# Patient Record
Sex: Female | Born: 1960 | Race: White | Hispanic: No | Marital: Married | Smoking: Former smoker
Health system: Southern US, Community
[De-identification: ages and names within clinical notes are randomized; demographics above are authoritative.]

## PROBLEM LIST (undated history)

## (undated) ENCOUNTER — Inpatient Hospital Stay (HOSPITAL_COMMUNITY): Payer: Commercial Managed Care - HMO

## (undated) DIAGNOSIS — F309 Manic episode, unspecified: Secondary | ICD-10-CM

## (undated) DIAGNOSIS — F319 Bipolar disorder, unspecified: Secondary | ICD-10-CM

## (undated) DIAGNOSIS — E079 Disorder of thyroid, unspecified: Secondary | ICD-10-CM

## (undated) DIAGNOSIS — F329 Major depressive disorder, single episode, unspecified: Secondary | ICD-10-CM

## (undated) DIAGNOSIS — C229 Malignant neoplasm of liver, not specified as primary or secondary: Secondary | ICD-10-CM

## (undated) DIAGNOSIS — F32A Depression, unspecified: Secondary | ICD-10-CM

## (undated) DIAGNOSIS — R51 Headache: Secondary | ICD-10-CM

## (undated) HISTORY — DX: Headache: R51

## (undated) HISTORY — DX: Depression, unspecified: F32.A

## (undated) HISTORY — DX: Manic episode, unspecified: F30.9

## (undated) HISTORY — PX: TUBAL LIGATION: SHX77

## (undated) HISTORY — DX: Major depressive disorder, single episode, unspecified: F32.9

## (undated) HISTORY — DX: Disorder of thyroid, unspecified: E07.9

---

## 2001-06-02 ENCOUNTER — Encounter: Payer: Self-pay | Admitting: Internal Medicine

## 2001-06-02 ENCOUNTER — Encounter: Admission: RE | Admit: 2001-06-02 | Discharge: 2001-06-02 | Payer: Self-pay | Admitting: Internal Medicine

## 2001-06-23 ENCOUNTER — Ambulatory Visit (HOSPITAL_COMMUNITY): Admission: RE | Admit: 2001-06-23 | Discharge: 2001-06-23 | Payer: Self-pay | Admitting: Specialist

## 2002-10-26 ENCOUNTER — Encounter: Payer: Self-pay | Admitting: Internal Medicine

## 2002-10-26 ENCOUNTER — Encounter: Admission: RE | Admit: 2002-10-26 | Discharge: 2002-10-26 | Payer: Self-pay | Admitting: Internal Medicine

## 2004-11-07 ENCOUNTER — Encounter: Admission: RE | Admit: 2004-11-07 | Discharge: 2004-11-07 | Payer: Self-pay | Admitting: Internal Medicine

## 2005-12-05 ENCOUNTER — Encounter: Admission: RE | Admit: 2005-12-05 | Discharge: 2005-12-05 | Payer: Self-pay | Admitting: Internal Medicine

## 2006-03-20 ENCOUNTER — Emergency Department (HOSPITAL_COMMUNITY): Admission: EM | Admit: 2006-03-20 | Discharge: 2006-03-20 | Payer: Self-pay | Admitting: Emergency Medicine

## 2006-03-26 ENCOUNTER — Ambulatory Visit (HOSPITAL_COMMUNITY): Payer: Self-pay | Admitting: Psychiatry

## 2006-04-03 ENCOUNTER — Ambulatory Visit (HOSPITAL_COMMUNITY): Payer: Self-pay | Admitting: Psychiatry

## 2006-04-17 ENCOUNTER — Ambulatory Visit (HOSPITAL_COMMUNITY): Payer: Self-pay | Admitting: Psychiatry

## 2006-07-30 ENCOUNTER — Ambulatory Visit: Payer: Self-pay | Admitting: Psychiatry

## 2006-07-30 ENCOUNTER — Inpatient Hospital Stay (HOSPITAL_COMMUNITY): Admission: RE | Admit: 2006-07-30 | Discharge: 2006-08-05 | Payer: Self-pay | Admitting: Psychiatry

## 2006-08-11 ENCOUNTER — Ambulatory Visit (HOSPITAL_COMMUNITY): Payer: Self-pay | Admitting: Psychiatry

## 2006-08-17 ENCOUNTER — Ambulatory Visit (HOSPITAL_COMMUNITY): Payer: Self-pay | Admitting: Psychiatry

## 2006-08-25 ENCOUNTER — Ambulatory Visit (HOSPITAL_COMMUNITY): Payer: Self-pay | Admitting: Psychiatry

## 2006-08-27 ENCOUNTER — Ambulatory Visit (HOSPITAL_COMMUNITY): Payer: Self-pay | Admitting: Psychiatry

## 2006-09-01 ENCOUNTER — Ambulatory Visit (HOSPITAL_COMMUNITY): Payer: Self-pay | Admitting: Psychiatry

## 2006-09-11 ENCOUNTER — Ambulatory Visit (HOSPITAL_COMMUNITY): Payer: Self-pay | Admitting: Psychiatry

## 2006-09-25 ENCOUNTER — Ambulatory Visit (HOSPITAL_COMMUNITY): Payer: Self-pay | Admitting: Psychiatry

## 2006-10-12 ENCOUNTER — Ambulatory Visit (HOSPITAL_COMMUNITY): Payer: Self-pay | Admitting: Psychiatry

## 2006-10-13 ENCOUNTER — Ambulatory Visit (HOSPITAL_COMMUNITY): Payer: Self-pay | Admitting: Psychiatry

## 2006-11-12 ENCOUNTER — Ambulatory Visit (HOSPITAL_COMMUNITY): Payer: Self-pay | Admitting: Psychiatry

## 2006-11-16 ENCOUNTER — Ambulatory Visit (HOSPITAL_COMMUNITY): Payer: Self-pay | Admitting: Psychiatry

## 2006-11-30 ENCOUNTER — Ambulatory Visit (HOSPITAL_COMMUNITY): Payer: Self-pay | Admitting: Psychiatry

## 2006-12-10 ENCOUNTER — Ambulatory Visit (HOSPITAL_COMMUNITY): Payer: Self-pay | Admitting: Psychiatry

## 2006-12-22 ENCOUNTER — Ambulatory Visit (HOSPITAL_COMMUNITY): Payer: Self-pay | Admitting: Psychiatry

## 2007-01-05 ENCOUNTER — Ambulatory Visit (HOSPITAL_COMMUNITY): Payer: Self-pay | Admitting: Psychiatry

## 2007-02-02 ENCOUNTER — Ambulatory Visit (HOSPITAL_COMMUNITY): Payer: Self-pay | Admitting: Psychiatry

## 2007-03-04 ENCOUNTER — Ambulatory Visit (HOSPITAL_COMMUNITY): Payer: Self-pay | Admitting: Psychiatry

## 2007-04-29 ENCOUNTER — Ambulatory Visit (HOSPITAL_COMMUNITY): Payer: Self-pay | Admitting: Psychiatry

## 2007-05-06 ENCOUNTER — Ambulatory Visit (HOSPITAL_COMMUNITY): Payer: Self-pay | Admitting: Psychiatry

## 2007-05-14 ENCOUNTER — Ambulatory Visit (HOSPITAL_COMMUNITY): Payer: Self-pay | Admitting: Psychiatry

## 2007-05-21 ENCOUNTER — Ambulatory Visit (HOSPITAL_COMMUNITY): Payer: Self-pay | Admitting: Psychiatry

## 2007-05-31 ENCOUNTER — Ambulatory Visit (HOSPITAL_COMMUNITY): Payer: Self-pay | Admitting: Psychiatry

## 2007-06-04 ENCOUNTER — Ambulatory Visit (HOSPITAL_COMMUNITY): Payer: Self-pay | Admitting: Psychiatry

## 2007-06-10 ENCOUNTER — Ambulatory Visit (HOSPITAL_COMMUNITY): Payer: Self-pay | Admitting: Psychiatry

## 2007-06-18 ENCOUNTER — Ambulatory Visit (HOSPITAL_COMMUNITY): Payer: Self-pay | Admitting: Psychiatry

## 2007-07-08 ENCOUNTER — Ambulatory Visit (HOSPITAL_COMMUNITY): Payer: Self-pay | Admitting: Psychiatry

## 2007-07-23 ENCOUNTER — Ambulatory Visit (HOSPITAL_COMMUNITY): Payer: Self-pay | Admitting: Psychiatry

## 2007-07-30 ENCOUNTER — Ambulatory Visit (HOSPITAL_COMMUNITY): Payer: Self-pay | Admitting: Psychiatry

## 2007-08-05 ENCOUNTER — Ambulatory Visit (HOSPITAL_COMMUNITY): Payer: Self-pay | Admitting: Psychiatry

## 2007-08-19 ENCOUNTER — Ambulatory Visit (HOSPITAL_COMMUNITY): Payer: Self-pay | Admitting: Psychiatry

## 2007-09-03 ENCOUNTER — Ambulatory Visit (HOSPITAL_COMMUNITY): Payer: Self-pay | Admitting: Psychiatry

## 2007-09-17 ENCOUNTER — Ambulatory Visit (HOSPITAL_COMMUNITY): Payer: Self-pay | Admitting: Psychiatry

## 2007-09-24 ENCOUNTER — Ambulatory Visit (HOSPITAL_COMMUNITY): Payer: Self-pay | Admitting: Psychiatry

## 2007-10-05 ENCOUNTER — Ambulatory Visit (HOSPITAL_COMMUNITY): Payer: Self-pay | Admitting: Psychiatry

## 2007-10-08 ENCOUNTER — Ambulatory Visit (HOSPITAL_COMMUNITY): Payer: Self-pay | Admitting: Psychiatry

## 2007-10-14 ENCOUNTER — Ambulatory Visit (HOSPITAL_COMMUNITY): Payer: Self-pay | Admitting: Psychiatry

## 2007-10-22 ENCOUNTER — Ambulatory Visit (HOSPITAL_COMMUNITY): Payer: Self-pay | Admitting: Psychiatry

## 2007-11-05 ENCOUNTER — Ambulatory Visit (HOSPITAL_COMMUNITY): Payer: Self-pay | Admitting: Psychiatry

## 2007-11-19 ENCOUNTER — Ambulatory Visit (HOSPITAL_COMMUNITY): Payer: Self-pay | Admitting: Psychiatry

## 2007-12-02 ENCOUNTER — Ambulatory Visit (HOSPITAL_COMMUNITY): Payer: Self-pay | Admitting: Psychiatry

## 2007-12-03 ENCOUNTER — Ambulatory Visit (HOSPITAL_COMMUNITY): Payer: Self-pay | Admitting: Psychiatry

## 2007-12-17 ENCOUNTER — Ambulatory Visit (HOSPITAL_COMMUNITY): Payer: Self-pay | Admitting: Psychiatry

## 2007-12-31 ENCOUNTER — Ambulatory Visit (HOSPITAL_COMMUNITY): Payer: Self-pay | Admitting: Psychiatry

## 2008-01-14 ENCOUNTER — Ambulatory Visit (HOSPITAL_COMMUNITY): Payer: Self-pay | Admitting: Psychiatry

## 2008-01-28 ENCOUNTER — Ambulatory Visit (HOSPITAL_COMMUNITY): Payer: Self-pay | Admitting: Psychiatry

## 2008-02-01 ENCOUNTER — Ambulatory Visit (HOSPITAL_COMMUNITY): Payer: Self-pay | Admitting: Psychiatry

## 2008-02-08 ENCOUNTER — Ambulatory Visit (HOSPITAL_COMMUNITY): Payer: Self-pay | Admitting: Psychiatry

## 2008-02-11 ENCOUNTER — Ambulatory Visit (HOSPITAL_COMMUNITY): Payer: Self-pay | Admitting: Psychiatry

## 2008-02-24 ENCOUNTER — Ambulatory Visit (HOSPITAL_COMMUNITY): Payer: Self-pay | Admitting: Psychiatry

## 2008-03-10 ENCOUNTER — Ambulatory Visit (HOSPITAL_COMMUNITY): Payer: Self-pay | Admitting: Psychiatry

## 2008-03-31 ENCOUNTER — Ambulatory Visit (HOSPITAL_COMMUNITY): Payer: Self-pay | Admitting: Psychiatry

## 2008-04-21 ENCOUNTER — Ambulatory Visit (HOSPITAL_COMMUNITY): Payer: Self-pay | Admitting: Psychiatry

## 2008-05-02 ENCOUNTER — Ambulatory Visit (HOSPITAL_COMMUNITY): Payer: Self-pay | Admitting: Psychiatry

## 2008-06-09 ENCOUNTER — Ambulatory Visit (HOSPITAL_COMMUNITY): Payer: Self-pay | Admitting: Psychiatry

## 2008-06-15 ENCOUNTER — Encounter: Admission: RE | Admit: 2008-06-15 | Discharge: 2008-06-15 | Payer: Self-pay | Admitting: Internal Medicine

## 2008-07-03 ENCOUNTER — Ambulatory Visit (HOSPITAL_COMMUNITY): Payer: Self-pay | Admitting: Psychiatry

## 2008-07-14 ENCOUNTER — Ambulatory Visit (HOSPITAL_COMMUNITY): Payer: Self-pay | Admitting: Psychiatry

## 2008-08-01 ENCOUNTER — Ambulatory Visit (HOSPITAL_COMMUNITY): Payer: Self-pay | Admitting: Psychiatry

## 2008-08-18 ENCOUNTER — Ambulatory Visit (HOSPITAL_COMMUNITY): Payer: Self-pay | Admitting: Psychiatry

## 2008-09-22 ENCOUNTER — Ambulatory Visit (HOSPITAL_COMMUNITY): Payer: Self-pay | Admitting: Psychiatry

## 2008-10-31 ENCOUNTER — Ambulatory Visit (HOSPITAL_COMMUNITY): Payer: Self-pay | Admitting: Psychiatry

## 2008-12-06 ENCOUNTER — Ambulatory Visit (HOSPITAL_COMMUNITY): Payer: Self-pay | Admitting: Psychiatry

## 2008-12-29 ENCOUNTER — Ambulatory Visit (HOSPITAL_COMMUNITY): Payer: Self-pay | Admitting: Psychiatry

## 2009-01-19 ENCOUNTER — Ambulatory Visit (HOSPITAL_COMMUNITY): Payer: Self-pay | Admitting: Psychiatry

## 2009-01-25 ENCOUNTER — Ambulatory Visit (HOSPITAL_COMMUNITY): Payer: Self-pay | Admitting: Psychiatry

## 2009-02-09 ENCOUNTER — Ambulatory Visit (HOSPITAL_COMMUNITY): Payer: Self-pay | Admitting: Psychiatry

## 2009-02-23 ENCOUNTER — Ambulatory Visit (HOSPITAL_COMMUNITY): Payer: Self-pay | Admitting: Psychiatry

## 2009-04-27 ENCOUNTER — Ambulatory Visit (HOSPITAL_COMMUNITY): Payer: Self-pay | Admitting: Psychiatry

## 2009-05-10 ENCOUNTER — Ambulatory Visit (HOSPITAL_COMMUNITY): Payer: Self-pay | Admitting: Psychiatry

## 2009-05-18 ENCOUNTER — Ambulatory Visit (HOSPITAL_COMMUNITY): Payer: Self-pay | Admitting: Psychiatry

## 2009-06-15 ENCOUNTER — Ambulatory Visit (HOSPITAL_COMMUNITY): Payer: Self-pay | Admitting: Psychiatry

## 2009-06-18 ENCOUNTER — Encounter: Admission: RE | Admit: 2009-06-18 | Discharge: 2009-06-18 | Payer: Self-pay | Admitting: Internal Medicine

## 2009-07-13 ENCOUNTER — Ambulatory Visit (HOSPITAL_COMMUNITY): Payer: Self-pay | Admitting: Psychiatry

## 2009-08-07 ENCOUNTER — Ambulatory Visit (HOSPITAL_COMMUNITY): Payer: Self-pay | Admitting: Psychiatry

## 2009-08-17 ENCOUNTER — Ambulatory Visit (HOSPITAL_COMMUNITY): Payer: Self-pay | Admitting: Psychiatry

## 2009-10-30 ENCOUNTER — Ambulatory Visit (HOSPITAL_COMMUNITY): Payer: Self-pay | Admitting: Psychiatry

## 2009-11-16 ENCOUNTER — Ambulatory Visit (HOSPITAL_COMMUNITY): Payer: Self-pay | Admitting: Psychiatry

## 2010-01-22 ENCOUNTER — Ambulatory Visit (HOSPITAL_COMMUNITY): Payer: Self-pay | Admitting: Psychiatry

## 2010-04-23 ENCOUNTER — Ambulatory Visit (HOSPITAL_COMMUNITY): Payer: Self-pay | Admitting: Psychiatry

## 2010-06-20 ENCOUNTER — Encounter: Admission: RE | Admit: 2010-06-20 | Discharge: 2010-06-20 | Payer: Self-pay | Admitting: Internal Medicine

## 2010-07-02 ENCOUNTER — Ambulatory Visit (HOSPITAL_COMMUNITY): Payer: Self-pay | Admitting: Psychiatry

## 2010-07-24 ENCOUNTER — Encounter: Admission: RE | Admit: 2010-07-24 | Discharge: 2010-07-24 | Payer: Self-pay | Admitting: Internal Medicine

## 2010-09-17 ENCOUNTER — Ambulatory Visit (HOSPITAL_COMMUNITY)
Admission: RE | Admit: 2010-09-17 | Discharge: 2010-09-17 | Payer: Self-pay | Source: Home / Self Care | Attending: Psychiatry | Admitting: Psychiatry

## 2010-09-24 ENCOUNTER — Ambulatory Visit (HOSPITAL_COMMUNITY)
Admission: RE | Admit: 2010-09-24 | Discharge: 2010-09-24 | Payer: Self-pay | Source: Home / Self Care | Attending: Psychiatry | Admitting: Psychiatry

## 2010-12-17 ENCOUNTER — Encounter (INDEPENDENT_AMBULATORY_CARE_PROVIDER_SITE_OTHER): Payer: Medicare (Managed Care) | Admitting: Psychiatry

## 2010-12-17 DIAGNOSIS — F319 Bipolar disorder, unspecified: Secondary | ICD-10-CM

## 2011-01-01 ENCOUNTER — Encounter (INDEPENDENT_AMBULATORY_CARE_PROVIDER_SITE_OTHER): Payer: Medicare (Managed Care) | Admitting: Psychiatry

## 2011-01-01 DIAGNOSIS — F319 Bipolar disorder, unspecified: Secondary | ICD-10-CM

## 2011-01-16 ENCOUNTER — Encounter (HOSPITAL_COMMUNITY): Payer: Medicare (Managed Care) | Admitting: Psychiatry

## 2011-01-16 ENCOUNTER — Encounter (INDEPENDENT_AMBULATORY_CARE_PROVIDER_SITE_OTHER): Payer: Medicare (Managed Care) | Admitting: Psychiatry

## 2011-01-16 DIAGNOSIS — F319 Bipolar disorder, unspecified: Secondary | ICD-10-CM

## 2011-01-31 NOTE — Op Note (Signed)
[place]  Patient:    [name], [name] Visit Number: [id number] MRN: [id number]          Service Type: DSU Location: DAY Attending Physician:  Micael Hampshire Dictated by:   Belia Heman. Kate Sable, M.D. Proc. Date: 06/23/01 Admit Date:  06/23/2001 Discharge Date: 06/23/2001                             Operative Report  PREOPERATIVE DIAGNOSIS:  Desire for sterilization.  POSTOPERATIVE DIAGNOSIS:  Desire for sterilization.  PROCEDURE:  Laparoscopy tubal cauterization.  SURGEON:  Belia Heman. Kate Sable, M.D.  ANESTHESIA:  General.  DESCRIPTION OF THE OPERATIVE PROCEDURE:  Following induction of general anesthesia, the patient was placed in lithotomy position and suitably prepped and draped.  The bladder was emptied by catheter.  Speculum examination revealed no lesions of the cervix.  On bimanual examination, the uterus was anterior, small and normal in size, shape, contour and consistency.  The ovaries were each palpated and were not enlarged.  There were no palpable adnexal masses and the cul-de-sac was negative.  The cervix was grasped with a Hulka tenaculum.  Gloves were changed and attention was directed to the abdomen, which was retracted with two towel clips.  A small incision was made inside the inferior rim of the umbilicus.  A disposable Veress needle was introduced through the incision into the abdominal cavity.  A sealing test was performed.  The Veress needle was attached to CO2 and flow was begun on #1 on the flowmeter and then advanced to #2.  The abdomen was insufflated with approximately 2.5 L of CO2.  The Veress needle was removed and the incision was enlarged to accommodate a disposable 10-mm trocar, which was introduced through the incision into the abdominal cavity.  The trocar was removed leaving the trocar sleeve, through which was placed the operating laparoscope with grasping instrument in place.  The uterus, tubes and  ovaries were all easily visualized and no abnormalities were found.  Both tubes were traced out to their fimbriated ends for positive identification.  The right tube was grasped in its midportion and cauterized for a distance of approximately 2-3 cm in the direction of the cornu.  Cauterization was continued until there was no resistance on the cauterization meter.  The identical procedure was carried out on the opposite side.  Both tubes were again positively identified and it was ascertained that both had been thoroughly cauterized.  No bleeding was engendered in the course of the operative procedure.  The laparoscope was removed, allowing the CO2 to escape.  The trocar sleeve was removed and the incision was repaired with a figure-of-eight 0 Vicryl suture, followed by two 3-0 Dexon sutures approximating the skin edges.  Betadine ointment and a Band-Aid were placed over the incision.  Estimated blood loss was 0 and there were no operative complications.  The Hulka tenaculum was removed from below. The patient tolerated the procedure well and left the operating room in satisfactory postoperative condition. Dictated by:   Belia Heman. Kate Sable, M.D. Attending Physician:  Micael Hampshire DD:  06/23/01 TD:  06/24/01 Job: [id number]
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
## 2011-02-14 ENCOUNTER — Encounter (INDEPENDENT_AMBULATORY_CARE_PROVIDER_SITE_OTHER): Payer: Medicare (Managed Care) | Admitting: Psychiatry

## 2011-02-14 DIAGNOSIS — F319 Bipolar disorder, unspecified: Secondary | ICD-10-CM

## 2011-02-17 ENCOUNTER — Encounter (HOSPITAL_COMMUNITY): Payer: Medicare (Managed Care) | Admitting: Psychiatry

## 2011-03-18 ENCOUNTER — Encounter (INDEPENDENT_AMBULATORY_CARE_PROVIDER_SITE_OTHER): Payer: Medicare (Managed Care) | Admitting: Psychiatry

## 2011-03-18 DIAGNOSIS — F319 Bipolar disorder, unspecified: Secondary | ICD-10-CM

## 2011-04-18 ENCOUNTER — Encounter (HOSPITAL_COMMUNITY): Payer: Medicare (Managed Care) | Admitting: Psychiatry

## 2011-05-14 ENCOUNTER — Other Ambulatory Visit: Payer: Self-pay | Admitting: Internal Medicine

## 2011-05-14 DIAGNOSIS — Z1231 Encounter for screening mammogram for malignant neoplasm of breast: Secondary | ICD-10-CM

## 2011-06-02 ENCOUNTER — Other Ambulatory Visit: Payer: Self-pay | Admitting: Internal Medicine

## 2011-06-02 DIAGNOSIS — N83201 Unspecified ovarian cyst, right side: Secondary | ICD-10-CM

## 2011-06-19 ENCOUNTER — Encounter (HOSPITAL_COMMUNITY): Payer: Medicare (Managed Care) | Admitting: Psychiatry

## 2011-06-23 ENCOUNTER — Ambulatory Visit
Admission: RE | Admit: 2011-06-23 | Discharge: 2011-06-23 | Disposition: A | Discharge status: Home / Self Care | Payer: Medicare (Managed Care) | Source: Ambulatory Visit | Attending: Internal Medicine | Admitting: Internal Medicine

## 2011-06-23 DIAGNOSIS — N83201 Unspecified ovarian cyst, right side: Secondary | ICD-10-CM

## 2011-06-23 DIAGNOSIS — Z1231 Encounter for screening mammogram for malignant neoplasm of breast: Secondary | ICD-10-CM

## 2011-06-27 ENCOUNTER — Other Ambulatory Visit (HOSPITAL_COMMUNITY): Payer: Self-pay | Admitting: Internal Medicine

## 2011-06-27 DIAGNOSIS — N840 Polyp of corpus uteri: Secondary | ICD-10-CM

## 2011-07-03 ENCOUNTER — Ambulatory Visit (HOSPITAL_COMMUNITY)
Admission: RE | Admit: 2011-07-03 | Discharge: 2011-07-03 | Disposition: A | Discharge status: Home / Self Care | Payer: Medicare (Managed Care) | Source: Ambulatory Visit | Attending: Internal Medicine | Admitting: Internal Medicine

## 2011-07-03 DIAGNOSIS — N949 Unspecified condition associated with female genital organs and menstrual cycle: Secondary | ICD-10-CM | POA: Insufficient documentation

## 2011-07-03 DIAGNOSIS — N840 Polyp of corpus uteri: Secondary | ICD-10-CM | POA: Insufficient documentation

## 2011-08-02 ENCOUNTER — Other Ambulatory Visit (HOSPITAL_COMMUNITY): Payer: Self-pay

## 2011-08-05 ENCOUNTER — Ambulatory Visit (INDEPENDENT_AMBULATORY_CARE_PROVIDER_SITE_OTHER): Payer: Medicare (Managed Care) | Admitting: Psychiatry

## 2011-08-05 ENCOUNTER — Encounter (HOSPITAL_COMMUNITY): Payer: Medicare (Managed Care) | Admitting: Psychiatry

## 2011-08-05 ENCOUNTER — Encounter (HOSPITAL_COMMUNITY): Payer: Self-pay | Admitting: Psychiatry

## 2011-08-05 VITALS — Wt 133.0 lb

## 2011-08-05 DIAGNOSIS — F319 Bipolar disorder, unspecified: Secondary | ICD-10-CM

## 2011-08-05 MED ORDER — RISPERIDONE 2 MG PO TABS: 2.0000 mg | ORAL_TABLET | Freq: Every day | ORAL | Status: DC

## 2011-08-05 MED ORDER — BUPROPION HCL ER (XL) 300 MG PO TB24: 300.0000 mg | ORAL_TABLET | Freq: Every day | ORAL | Status: DC

## 2011-08-05 MED ORDER — BENZTROPINE MESYLATE 1 MG PO TABS: 1.0000 mg | ORAL_TABLET | Freq: Every day | ORAL | Status: DC

## 2011-08-05 NOTE — Progress Notes (Signed)
Patient came for her followup appointment. She continues to have residual paranoia but overall she has been stable on her current medication. She's been sleeping good she is very relief that her daughter is doing good. Her daughter is working and also taking care of her child when she come back from job. Patient is still taking care of her grandchild on her daughter is at work. She continues to have episodic agitation but denies any recent episodes. She admitted these episodes are less intense and less frequent. She denies any crying spells or anger. She also reported less racing thoughts and better sleep and she is compliant with her medication. Overall she has no concern her side effects of medication she has been in process of changing her insurance in next 2 months. She is looking forward to have Thanksgiving dinner but the family. There is no extrapyramidal side effects of the medication noted. She is scheduled to see her primary care physician in February and at that time she may get her annual labs.  Mental status examination Patient is anxious but cooperative she maintained fair eye contact. Her speech is slow but coherent. She denies any orderly hallucinations suicidal thoughts or homicidal thoughts. She does endorse some paranoia but there were no delusion or psychotic symptoms present. Her thought process is slow but logical and goal-directed. She described her mood is anxious and affect is constricted. She's alert and oriented x3. Her insight judgment and impulse control is okay.  Assessment Bipolar disorder with psychotic features  Plan House continue her Risperdal 2 mg along with Cogentin 1 mg and Wellbutrin 300 mg daily. I explained risks and benefits of medication and currently patient not experiencing any side effects of medication. I recommended to call us if she has any question or concern otherwise I will see her again in 3 months. Patient does not want to change or increase her  medication dosage.

## 2011-10-21 ENCOUNTER — Encounter (HOSPITAL_COMMUNITY): Payer: Self-pay | Admitting: Psychiatry

## 2011-10-21 ENCOUNTER — Ambulatory Visit (INDEPENDENT_AMBULATORY_CARE_PROVIDER_SITE_OTHER): Payer: Medicare Other | Admitting: Psychiatry

## 2011-10-21 VITALS — Wt 135.0 lb

## 2011-10-21 DIAGNOSIS — F319 Bipolar disorder, unspecified: Secondary | ICD-10-CM

## 2011-10-21 MED ORDER — RISPERIDONE 2 MG PO TABS: 2.0000 mg | ORAL_TABLET | Freq: Every day | ORAL | Status: DC

## 2011-10-21 MED ORDER — BUPROPION HCL ER (XL) 300 MG PO TB24: 300.0000 mg | ORAL_TABLET | Freq: Every day | ORAL | Status: DC

## 2011-10-21 MED ORDER — BENZTROPINE MESYLATE 1 MG PO TABS: 1.0000 mg | ORAL_TABLET | Freq: Every day | ORAL | Status: DC

## 2011-10-21 NOTE — Progress Notes (Signed)
Patient came for her followup appointment. She's excited as yesterday her daughter gave birth. Baby and daughter both are fine. This is the patient's fifth grandchild. Overall patient has been stable. Her paranoia is less intense and less frequent. She continues to have episodic agitation and crying spells but they are less frequent and less intense. She reported no side effects of medication. She is sleeping 8 hours. Her other daughter is helping her a lot. Her relationship with her daughter is also improved from the past. She denies any agitation anger or severe mood swings. She has been compliant with her medication and felt current medicine working well.  Mental status examination Patient is pleasant cooperative and maintained good eye contact. Her speech is soft clear and coherent. Her thought process is logical linear and goal-directed. Her attention and concentration is fair. She described her mood is good and her affect is bright. She denies any active or passive suicidal thoughts or homicidal thoughts. She denies any auditory or visual hallucination. She's alert and oriented x3. Her insight judgment impulse control is okay  Assessment Bipolar disorder with psychotic features  Plan I will continue her Risperdal 2 mg along with Cogentin 1 mg and Wellbutrin 300 mg daily. I explained risks and benefits of medication. Currently patient not experiencing any side effects of medication. I recommended to call us if she has any question or concern otherwise I will see her again in 3 months. Patient does not want to change or increase her medication dosage. We will do blood work on next visit

## 2011-12-18 ENCOUNTER — Ambulatory Visit (INDEPENDENT_AMBULATORY_CARE_PROVIDER_SITE_OTHER): Payer: Medicare Other | Admitting: Psychiatry

## 2011-12-18 ENCOUNTER — Encounter (HOSPITAL_COMMUNITY): Payer: Self-pay | Admitting: Psychiatry

## 2011-12-18 DIAGNOSIS — F319 Bipolar disorder, unspecified: Secondary | ICD-10-CM

## 2011-12-18 DIAGNOSIS — Z79899 Other long term (current) drug therapy: Secondary | ICD-10-CM

## 2011-12-18 LAB — CBC WITH DIFFERENTIAL/PLATELET
Basophils Absolute: 0 10*3/uL (ref 0.0–0.1)
Basophils Relative: 0 % (ref 0–1)
Eosinophils Relative: 4 % (ref 0–5)
HCT: 41 % (ref 36.0–46.0)
MCH: 28.2 pg (ref 26.0–34.0)
MCHC: 32 g/dL (ref 30.0–36.0)
MCV: 88.4 fL (ref 78.0–100.0)
Neutro Abs: 2.3 10*3/uL (ref 1.7–7.7)
Neutrophils Relative %: 44 % (ref 43–77)
Platelets: 246 10*3/uL (ref 150–400)
RBC: 4.64 MIL/uL (ref 3.87–5.11)
RDW: 13.7 % (ref 11.5–15.5)
WBC: 5.3 10*3/uL (ref 4.0–10.5)

## 2011-12-18 LAB — COMPREHENSIVE METABOLIC PANEL
ALT: 12 U/L (ref 0–35)
Alkaline Phosphatase: 65 U/L (ref 39–117)
Calcium: 9.6 mg/dL (ref 8.4–10.5)
Potassium: 4.1 mEq/L (ref 3.5–5.3)

## 2011-12-18 LAB — HEMOGLOBIN A1C
Hgb A1c MFr Bld: 5.6 % (ref <5.7)
Mean Plasma Glucose: 114 mg/dL (ref <117)

## 2011-12-18 NOTE — Progress Notes (Signed)
Chief complaint Medication management and followup.  History of present illness Patient came for her followup appointment.  She's compliant with her medication and reported no side effects.  She continues to have some residual paranoia but she denies any recent hallucination or agitation .  Patient likes her current medication and she has no concern or side effects .  She has recently seen her primary care Dr. for thyroid level checked .  She reported her daughter is moved out and doing better .  Patient denies any crying spells or any active or passive suicidal thinking.  Her relationship with her daughter is much improved.  She denies any drinking or using any illegal substance .  Current psychiatric medication Risperdal 2 mg at bedtime Cogentin 1 mg at bedtime Wellbutrin XL 300 mg daily  Mental status examination Patient is casually dressed and well groomed.  She is pleasant cooperative and maintained good eye contact. Her speech is soft clear and coherent. Her thought process is logical linear and goal-directed. Her attention and concentration is fair. She described her mood is good and her affect is bright. She denies any active or passive suicidal thoughts or homicidal thoughts. She denies any auditory or visual hallucination. She's alert and oriented x3. Her insight judgment impulse control is okay  Assessment Axis I Bipolar disorder with psychotic features Axis II deferred Axis III hypothyroidism, headache Axis IV mild Axis V 60-65  Plan I will continue her Risperdal 2 mg along with Cogentin 1 mg and Wellbutrin 300 mg daily. I explained risks and benefits of medication.  Patient has refill on her medication and does not require any new prescription at this time however she will call us when she need refills .  I ordered CBC CMP and hemoglobin A1c since she has not done these test in a while .  I recommend to call us if she has any question or concern about the medication otherwise I  will see her again in 8 weeks.

## 2012-02-03 ENCOUNTER — Other Ambulatory Visit (HOSPITAL_COMMUNITY): Payer: Self-pay | Admitting: Psychiatry

## 2012-02-03 DIAGNOSIS — F319 Bipolar disorder, unspecified: Secondary | ICD-10-CM

## 2012-02-17 ENCOUNTER — Ambulatory Visit (INDEPENDENT_AMBULATORY_CARE_PROVIDER_SITE_OTHER): Payer: Medicare Other | Admitting: Psychiatry

## 2012-02-17 ENCOUNTER — Encounter (HOSPITAL_COMMUNITY): Payer: Self-pay | Admitting: Psychiatry

## 2012-02-17 VITALS — Wt 128.0 lb

## 2012-02-17 DIAGNOSIS — F319 Bipolar disorder, unspecified: Secondary | ICD-10-CM

## 2012-02-17 MED ORDER — BUPROPION HCL ER (XL) 300 MG PO TB24: 300.0000 mg | ORAL_TABLET | Freq: Every day | ORAL | Status: DC

## 2012-02-17 MED ORDER — RISPERIDONE 2 MG PO TABS: 2.0000 mg | ORAL_TABLET | Freq: Every day | ORAL | Status: DC

## 2012-02-17 MED ORDER — BENZTROPINE MESYLATE 1 MG PO TABS: 1.0000 mg | ORAL_TABLET | Freq: Every day | ORAL | Status: DC
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
## 2012-05-18 ENCOUNTER — Ambulatory Visit (INDEPENDENT_AMBULATORY_CARE_PROVIDER_SITE_OTHER): Payer: Medicare Other | Admitting: Psychiatry

## 2012-05-18 ENCOUNTER — Encounter (HOSPITAL_COMMUNITY): Payer: Self-pay | Admitting: Psychiatry

## 2012-05-18 VITALS — BP 123/87 | HR 72 | Wt 123.0 lb

## 2012-05-18 DIAGNOSIS — F319 Bipolar disorder, unspecified: Secondary | ICD-10-CM

## 2012-05-18 MED ORDER — BUSPIRONE HCL 5 MG PO TABS: 30.0000 mg | ORAL_TABLET | Freq: Two times a day (BID) | ORAL | Status: DC

## 2012-05-18 MED ORDER — BENZTROPINE MESYLATE 1 MG PO TABS: 1.0000 mg | ORAL_TABLET | Freq: Every day | ORAL | Status: DC

## 2012-05-18 MED ORDER — RISPERIDONE 2 MG PO TABS: 2.0000 mg | ORAL_TABLET | Freq: Every day | ORAL | Status: DC

## 2012-05-18 MED ORDER — BUPROPION HCL ER (XL) 300 MG PO TB24: 300.0000 mg | ORAL_TABLET | Freq: Every day | ORAL | Status: DC
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
## 2012-05-20 NOTE — Addendum Note (Signed)
Addended by: Kathryne Sharper T on: 05/20/2012 11:02 AM   Modules accepted: Orders

## 2012-06-16 ENCOUNTER — Other Ambulatory Visit: Payer: Self-pay | Admitting: Internal Medicine

## 2012-06-16 DIAGNOSIS — Z1231 Encounter for screening mammogram for malignant neoplasm of breast: Secondary | ICD-10-CM

## 2012-06-17 ENCOUNTER — Ambulatory Visit (INDEPENDENT_AMBULATORY_CARE_PROVIDER_SITE_OTHER): Payer: Medicare Other | Admitting: Psychiatry

## 2012-06-17 ENCOUNTER — Encounter (HOSPITAL_COMMUNITY): Payer: Self-pay | Admitting: Psychiatry

## 2012-06-17 DIAGNOSIS — F319 Bipolar disorder, unspecified: Secondary | ICD-10-CM

## 2012-06-17 MED ORDER — HYDROXYZINE PAMOATE 25 MG PO CAPS: ORAL_CAPSULE | ORAL | Status: DC
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
## 2012-07-05 ENCOUNTER — Ambulatory Visit
Admission: RE | Admit: 2012-07-05 | Discharge: 2012-07-05 | Disposition: A | Discharge status: Home / Self Care | Payer: Medicare Other | Source: Ambulatory Visit | Attending: Internal Medicine | Admitting: Internal Medicine

## 2012-07-05 DIAGNOSIS — Z1231 Encounter for screening mammogram for malignant neoplasm of breast: Secondary | ICD-10-CM

## 2012-07-29 ENCOUNTER — Ambulatory Visit (INDEPENDENT_AMBULATORY_CARE_PROVIDER_SITE_OTHER): Payer: Medicare Other | Admitting: Psychiatry

## 2012-07-29 ENCOUNTER — Encounter (HOSPITAL_COMMUNITY): Payer: Self-pay | Admitting: Psychiatry

## 2012-07-29 VITALS — BP 136/84 | Ht 65.5 in | Wt 123.6 lb

## 2012-07-29 DIAGNOSIS — F319 Bipolar disorder, unspecified: Secondary | ICD-10-CM

## 2012-07-29 DIAGNOSIS — F410 Panic disorder [episodic paroxysmal anxiety] without agoraphobia: Secondary | ICD-10-CM | POA: Insufficient documentation

## 2012-07-29 MED ORDER — RISPERIDONE 0.25 MG PO TABS: 0.1250 mg | ORAL_TABLET | Freq: Two times a day (BID) | ORAL | Status: DC

## 2012-07-29 MED ORDER — BUPROPION HCL ER (XL) 300 MG PO TB24: 300.0000 mg | ORAL_TABLET | Freq: Every day | ORAL | Status: DC

## 2012-07-29 MED ORDER — RISPERIDONE 2 MG PO TABS: 2.0000 mg | ORAL_TABLET | Freq: Every day | ORAL | Status: DC
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
## 2012-07-29 NOTE — Patient Instructions (Addendum)
Set limits with daughter and grandson and expect more/louder fits.  When they learn that the fits don't work, the fits will slow down or hopefully stop.   Try low does Risperdal during the day.  Call if problems.

## 2012-08-27 ENCOUNTER — Encounter (HOSPITAL_COMMUNITY): Payer: Self-pay | Admitting: Psychiatry

## 2012-08-27 ENCOUNTER — Ambulatory Visit (INDEPENDENT_AMBULATORY_CARE_PROVIDER_SITE_OTHER): Payer: No Typology Code available for payment source | Admitting: Psychiatry

## 2012-08-27 VITALS — Wt 127.8 lb

## 2012-08-27 DIAGNOSIS — F319 Bipolar disorder, unspecified: Secondary | ICD-10-CM

## 2012-08-27 DIAGNOSIS — K5900 Constipation, unspecified: Secondary | ICD-10-CM | POA: Insufficient documentation

## 2012-08-27 DIAGNOSIS — F410 Panic disorder [episodic paroxysmal anxiety] without agoraphobia: Secondary | ICD-10-CM

## 2012-08-27 MED ORDER — RISPERIDONE 0.25 MG PO TABS: 0.1250 mg | ORAL_TABLET | Freq: Two times a day (BID) | ORAL | Status: DC

## 2012-08-27 MED ORDER — RISPERIDONE 2 MG PO TABS: 2.0000 mg | ORAL_TABLET | Freq: Every day | ORAL | Status: DC

## 2012-08-27 MED ORDER — BENZTROPINE MESYLATE 1 MG PO TABS: 1.0000 mg | ORAL_TABLET | Freq: Every day | ORAL | Status: DC

## 2012-08-27 MED ORDER — DOCUSATE SODIUM 100 MG PO CAPS: 100.0000 mg | ORAL_CAPSULE | Freq: Every day | ORAL | Status: AC | PRN

## 2012-08-27 MED ORDER — BUPROPION HCL ER (XL) 300 MG PO TB24: 300.0000 mg | ORAL_TABLET | Freq: Every day | ORAL | Status: DC

## 2012-08-27 NOTE — Patient Instructions (Signed)
Switch to water and increase fluids and exercise and see if that helps the constipation.  Colace can help with the constipation.  See how much you need on a regular basis.  Have a happy holiday!
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
## 2012-10-28 ENCOUNTER — Ambulatory Visit (HOSPITAL_COMMUNITY): Payer: Self-pay | Admitting: Psychiatry

## 2012-11-03 ENCOUNTER — Encounter (HOSPITAL_COMMUNITY): Payer: Self-pay | Admitting: Psychiatry

## 2012-11-03 ENCOUNTER — Ambulatory Visit (INDEPENDENT_AMBULATORY_CARE_PROVIDER_SITE_OTHER): Payer: No Typology Code available for payment source | Admitting: Psychiatry

## 2012-11-03 VITALS — Wt 126.0 lb

## 2012-11-03 DIAGNOSIS — F410 Panic disorder [episodic paroxysmal anxiety] without agoraphobia: Secondary | ICD-10-CM

## 2012-11-03 DIAGNOSIS — Z79899 Other long term (current) drug therapy: Secondary | ICD-10-CM

## 2012-11-03 DIAGNOSIS — F319 Bipolar disorder, unspecified: Secondary | ICD-10-CM

## 2012-11-03 MED ORDER — PROPRANOLOL HCL 10 MG PO TABS: 5.0000 mg | ORAL_TABLET | Freq: Three times a day (TID) | ORAL | Status: DC

## 2012-11-03 MED ORDER — RISPERIDONE 2 MG PO TABS: 2.0000 mg | ORAL_TABLET | Freq: Every day | ORAL | Status: DC

## 2012-11-03 MED ORDER — BENZTROPINE MESYLATE 1 MG PO TABS: 1.0000 mg | ORAL_TABLET | Freq: Every day | ORAL | Status: DC

## 2012-11-03 MED ORDER — BUPROPION HCL ER (XL) 300 MG PO TB24: 300.0000 mg | ORAL_TABLET | Freq: Every day | ORAL | Status: DC

## 2012-11-03 NOTE — Patient Instructions (Signed)
Relaxation is the ultimate solution for you.  You can seek it through tub baths, bubble baths, essential oils or incense, walking or chatting with friends, listening to soft music, watching a candle burn and just letting all thoughts go and appreciating the true essence of the Creator.   Call if problems or concerns.  Try Inderal up to 2 whole pills at a time.
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
## 2012-12-15 ENCOUNTER — Ambulatory Visit (HOSPITAL_COMMUNITY): Payer: Self-pay | Admitting: Psychiatry

## 2013-01-13 ENCOUNTER — Ambulatory Visit (INDEPENDENT_AMBULATORY_CARE_PROVIDER_SITE_OTHER): Payer: No Typology Code available for payment source | Admitting: Psychiatry

## 2013-01-13 ENCOUNTER — Encounter (HOSPITAL_COMMUNITY): Payer: Self-pay | Admitting: Psychiatry

## 2013-01-13 VITALS — BP 110/76 | HR 63 | Ht 64.0 in | Wt 128.2 lb

## 2013-01-13 DIAGNOSIS — F5105 Insomnia due to other mental disorder: Secondary | ICD-10-CM | POA: Insufficient documentation

## 2013-01-13 DIAGNOSIS — F418 Other specified anxiety disorders: Secondary | ICD-10-CM | POA: Insufficient documentation

## 2013-01-13 DIAGNOSIS — F410 Panic disorder [episodic paroxysmal anxiety] without agoraphobia: Secondary | ICD-10-CM

## 2013-01-13 DIAGNOSIS — F319 Bipolar disorder, unspecified: Secondary | ICD-10-CM

## 2013-01-13 MED ORDER — RISPERIDONE 1 MG PO TABS: 2.0000 mg | ORAL_TABLET | Freq: Every day | ORAL | Status: DC

## 2013-01-13 MED ORDER — BUPROPION HCL ER (XL) 300 MG PO TB24: 300.0000 mg | ORAL_TABLET | Freq: Every day | ORAL | Status: DC

## 2013-01-13 MED ORDER — PROPRANOLOL HCL 10 MG PO TABS: ORAL_TABLET | ORAL | Status: DC

## 2013-01-13 MED ORDER — BENZTROPINE MESYLATE 1 MG PO TABS: 1.0000 mg | ORAL_TABLET | Freq: Every day | ORAL | Status: DC
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
## 2013-01-13 NOTE — Patient Instructions (Signed)
Take the Inderal THREE times a day.  Call when that still doesn't help with falling asleep for the LONG ACTING form of Inderal  Take care of yourself.  No one else is standing up to do the job and only you know what you need.   GET SERIOUS about taking care of yourself.  Do the next right thing and that often means doing something to care for yourself along the lines of are you hungry, are you angry, are you lonely, are you tired, are you scared?  [employer] is what that stands for.  Call if problems or concerns.

## 2013-01-20 ENCOUNTER — Ambulatory Visit (INDEPENDENT_AMBULATORY_CARE_PROVIDER_SITE_OTHER): Payer: Medicare Other | Admitting: Psychiatry

## 2013-01-20 DIAGNOSIS — F319 Bipolar disorder, unspecified: Secondary | ICD-10-CM
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
## 2013-01-21 NOTE — Patient Instructions (Signed)
Discussed orally 

## 2013-02-14 ENCOUNTER — Ambulatory Visit (HOSPITAL_COMMUNITY): Payer: Self-pay | Admitting: Psychiatry

## 2013-02-15 ENCOUNTER — Ambulatory Visit (HOSPITAL_COMMUNITY): Payer: Self-pay | Admitting: Psychiatry

## 2013-05-04 ENCOUNTER — Ambulatory Visit (INDEPENDENT_AMBULATORY_CARE_PROVIDER_SITE_OTHER): Payer: No Typology Code available for payment source | Admitting: Psychiatry

## 2013-05-04 ENCOUNTER — Encounter (HOSPITAL_COMMUNITY): Payer: Self-pay | Admitting: Psychiatry

## 2013-05-04 VITALS — BP 120/90 | Ht 64.0 in | Wt 129.0 lb

## 2013-05-04 DIAGNOSIS — F319 Bipolar disorder, unspecified: Secondary | ICD-10-CM

## 2013-05-04 DIAGNOSIS — F410 Panic disorder [episodic paroxysmal anxiety] without agoraphobia: Secondary | ICD-10-CM

## 2013-05-04 DIAGNOSIS — F29 Unspecified psychosis not due to a substance or known physiological condition: Secondary | ICD-10-CM

## 2013-05-04 MED ORDER — PROPRANOLOL HCL 10 MG PO TABS: ORAL_TABLET | ORAL | Status: DC

## 2013-05-04 MED ORDER — RISPERIDONE 1 MG PO TABS: 2.0000 mg | ORAL_TABLET | Freq: Every day | ORAL | Status: DC

## 2013-05-04 MED ORDER — BUPROPION HCL ER (XL) 300 MG PO TB24: 300.0000 mg | ORAL_TABLET | Freq: Every day | ORAL | Status: DC

## 2013-05-04 MED ORDER — BENZTROPINE MESYLATE 1 MG PO TABS: 1.0000 mg | ORAL_TABLET | Freq: Every day | ORAL | Status: DC
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
## 2013-05-20 ENCOUNTER — Ambulatory Visit (HOSPITAL_COMMUNITY): Payer: Self-pay | Admitting: Psychiatry

## 2013-05-27 ENCOUNTER — Ambulatory Visit (INDEPENDENT_AMBULATORY_CARE_PROVIDER_SITE_OTHER): Payer: No Typology Code available for payment source | Admitting: Psychiatry

## 2013-05-27 DIAGNOSIS — F319 Bipolar disorder, unspecified: Secondary | ICD-10-CM

## 2013-05-27 NOTE — Patient Instructions (Signed)
Discussed orally 
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
## 2013-06-14 ENCOUNTER — Other Ambulatory Visit: Payer: Self-pay

## 2013-06-14 DIAGNOSIS — Z1231 Encounter for screening mammogram for malignant neoplasm of breast: Secondary | ICD-10-CM

## 2013-06-24 ENCOUNTER — Ambulatory Visit (INDEPENDENT_AMBULATORY_CARE_PROVIDER_SITE_OTHER): Payer: No Typology Code available for payment source | Admitting: Psychiatry

## 2013-06-24 DIAGNOSIS — F319 Bipolar disorder, unspecified: Secondary | ICD-10-CM

## 2013-06-27 NOTE — Patient Instructions (Signed)
Discussed orally 

## 2013-06-27 NOTE — Progress Notes (Signed)
Patient:  [name]  [name]  DOB: 12-19-1960  MR Number: [id number]  Location: [facility]:  [address] [place] [place],  [place], [address]  Start: Friday 06/24/2013 4:00 PM End: Friday 06/24/2013 4:50 PM  Provider/Observer:     Florencia Reasons, MSW, LCSW   Chief Complaint:      Chief Complaint  Patient presents with  . Anxiety  . Depression    Reason For Service:     The patient has a long-standing history of mood swings, anxiety, and depression and has been a patient in this practice since 2007. She is returning today having been seen last in May 2014. She reports increased stress due to youngest daughter being pregnant with her second child and her continued pattern of manipulation and  keeping her 43 year old son away from patient when she becomes angry with patient. She continues to worry about husband's health as he has cardiovascular disease and does not follow doctor's advice regarding diet and exercise.She is experiencing anxiety, sleep difficulty, and excessive worrying. Patient is seen today for follow up appointment.  Interventions Strategy:  Supportive therapy  Participation Level:   Active  Participation Quality:  Appropriate      Behavioral Observation:  Casual, Alert, and anxious  Current Psychosocial Factors: Daughter's behavior and pregnancy, father recently had a stroke, conflict with niece  Content of Session:   reviewing symptoms, processing feelings, reinforcing patient's efforts to set and maintain boundaries, identifying coping statements, encouraging patient to improve self-care  Current Status:   Patient reports anxiety, sleep difficulty, and excessive worrying  Patient Progress:   Patient  reports continued stress regarding her youngest daughter's behavior. However, she expresses increased acceptance that she is unable to change daughter's behavior. Patient states she has tried to stop arguing with her daughter as it does not help. Patient  reports increased stress due to to recent incident with her niece. Per patient's report, her father had a stroke a few weeks ago. Patient was unable to initially spend time with her father in the hospital as her niece requested that patient leave due to conflict niece and  patient had many years ago. Patient expresses frustration, anger, and disappointment regarding her mother's lack of advocacy for patient to remain and niece to leave considering that patient is the daughter Therapist works with patient to process her feelings. Patient reports mother has always showed preferential treatment patient's niece and cites this several examples. Patient is making efforts to set and maintain boundaries regarding her mother and her niece/  Target Goals:    identifying ways to improve assertiveness skills, decrease anxiety, improve self-care  Last Reviewed:     Goals Addressed Today:     identifying ways to improve assertiveness skills, decrease anxiety, improve self-care  Impression/Diagnosis:   The patient has a long-standing history of symptoms of anxiety and depression along with mood swings and currently is experiencing increased stress regarding issues with her daughter and her husband. She reports anxiety, excessive worrying, and sleep difficulty. Diagnosis: Bipolar 1 disorder  Diagnosis:  Axis I: Bipolar 1 disorder          Axis II: Deferred

## 2013-07-07 ENCOUNTER — Ambulatory Visit
Admission: RE | Admit: 2013-07-07 | Discharge: 2013-07-07 | Disposition: A | Discharge status: Home / Self Care | Payer: Medicare HMO | Source: Ambulatory Visit

## 2013-07-07 DIAGNOSIS — Z1231 Encounter for screening mammogram for malignant neoplasm of breast: Secondary | ICD-10-CM

## 2013-07-18 ENCOUNTER — Ambulatory Visit (INDEPENDENT_AMBULATORY_CARE_PROVIDER_SITE_OTHER): Payer: Self-pay | Admitting: Psychiatry

## 2013-07-18 DIAGNOSIS — F319 Bipolar disorder, unspecified: Secondary | ICD-10-CM

## 2013-07-18 NOTE — Progress Notes (Signed)
Patient:  [name]  [name]  DOB: 10/09/1960  MR Number: [id number]  Location: [facility]:  [address] [place] [place],  [place], [address]  Start: Monday 07/18/2013 2:05 PM End: Monday 07/18/2013 2:50 PM  Provider/Observer:     Florencia Reasons, MSW, LCSW   Chief Complaint:      Chief Complaint  Patient presents with  . Anxiety  . Depression    Reason For Service:     The patient has a long-standing history of mood swings, anxiety, and depression and has been a patient in this practice since 2007. She reports increased stress due to youngest daughter being pregnant with her second child and her continued pattern of manipulation and  keeping her 86 year old son away from patient when she becomes angry with patient. She continues to worry about husband's health as he has cardiovascular disease and does not follow doctor's advice regarding diet and exercise.She is experiencing anxiety, sleep difficulty, and excessive worrying. Patient is seen today for follow up appointment as she continues to experience stress and worry regarding family issues.  Interventions Strategy:  Supportive therapy  Participation Level:   Active  Participation Quality:  Appropriate      Behavioral Observation:  Casual, Alert, and anxious  Current Psychosocial Factors: Patient reports recent conflict between her 2 daughters.  Content of Session:   reviewing symptoms, processing feelings, reinforcing patient's efforts to set and maintain boundaries, identifying coping statements, encouraging patient to improve self-care  Current Status:   Patient reports anxiety, sleep difficulty, and excessive worrying  Patient Progress:   Patient  reports continued stress regarding family issues. Her 2 daughters had an argument this past weekend destroying patient's plans to go trick or treating with her children and grandchildren. She expresses frustration with her oldest daughter who  tried to put her in the middle  of the argument per patient's report. She states she tried to stay neutral as she realizes she cannot make her daughters do anything. Patient is upset that her daughters are no longer speaking to each other and fears her oldest daughter is angry with her for not taking sides. Patient's youngest daughter returned to reside with patient and her husband 2 weeks ago. She expresses some relief as she now knows her grandson is safe. Therapist works with patient to process her feelings, reinforcing efforts to set and maintain boundaries, and review coping and relaxation techniques   Target Goals:    identifying ways to improve assertiveness skills, decrease anxiety, improve self-care  Last Reviewed:     Goals Addressed Today:     identifying ways to improve assertiveness skills,   Impression/Diagnosis:   The patient has a long-standing history of symptoms of anxiety and depression along with mood swings and currently is experiencing increased stress regarding issues with her daughter and her husband. She reports anxiety, excessive worrying, and sleep difficulty. Diagnosis: Bipolar 1 disorder  Diagnosis:  Axis I: Bipolar 1 disorder          Axis II: Deferred

## 2013-07-18 NOTE — Patient Instructions (Signed)
Discussed orally 

## 2013-08-04 ENCOUNTER — Ambulatory Visit (INDEPENDENT_AMBULATORY_CARE_PROVIDER_SITE_OTHER): Payer: No Typology Code available for payment source | Admitting: Psychiatry

## 2013-08-04 ENCOUNTER — Encounter (HOSPITAL_COMMUNITY): Payer: Self-pay | Admitting: Psychiatry

## 2013-08-04 VITALS — BP 120/80 | Ht 64.0 in | Wt 136.0 lb

## 2013-08-04 DIAGNOSIS — F410 Panic disorder [episodic paroxysmal anxiety] without agoraphobia: Secondary | ICD-10-CM

## 2013-08-04 DIAGNOSIS — F319 Bipolar disorder, unspecified: Secondary | ICD-10-CM

## 2013-08-04 MED ORDER — BENZTROPINE MESYLATE 1 MG PO TABS: 1.0000 mg | ORAL_TABLET | Freq: Every day | ORAL | Status: DC

## 2013-08-04 MED ORDER — BUPROPION HCL ER (XL) 300 MG PO TB24: 300.0000 mg | ORAL_TABLET | Freq: Every day | ORAL | Status: DC

## 2013-08-04 MED ORDER — ZOLPIDEM TARTRATE 10 MG PO TABS: 10.0000 mg | ORAL_TABLET | Freq: Every evening | ORAL | Status: DC | PRN

## 2013-08-04 MED ORDER — RISPERIDONE 1 MG PO TABS: 2.0000 mg | ORAL_TABLET | Freq: Every day | ORAL | Status: DC

## 2013-08-04 MED ORDER — PROPRANOLOL HCL 10 MG PO TABS: ORAL_TABLET | ORAL | Status: DC
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
## 2013-08-16 ENCOUNTER — Telehealth (HOSPITAL_COMMUNITY): Payer: Self-pay

## 2013-08-16 ENCOUNTER — Other Ambulatory Visit (HOSPITAL_COMMUNITY): Payer: Self-pay | Admitting: Psychiatry

## 2013-08-16 MED ORDER — RISPERIDONE 3 MG PO TABS: 3.0000 mg | ORAL_TABLET | Freq: Every day | ORAL | Status: DC

## 2013-08-16 NOTE — Telephone Encounter (Signed)
Pt does better on Risperdal 3 mg, not 2 mg-- will send to pharmacy

## 2013-08-17 ENCOUNTER — Ambulatory Visit (INDEPENDENT_AMBULATORY_CARE_PROVIDER_SITE_OTHER): Payer: No Typology Code available for payment source | Admitting: Psychiatry

## 2013-08-17 DIAGNOSIS — F319 Bipolar disorder, unspecified: Secondary | ICD-10-CM
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
## 2013-08-17 NOTE — Patient Instructions (Signed)
Discussed orally 

## 2013-09-19 ENCOUNTER — Ambulatory Visit (INDEPENDENT_AMBULATORY_CARE_PROVIDER_SITE_OTHER): Payer: Commercial Managed Care - HMO | Admitting: Psychiatry

## 2013-09-19 ENCOUNTER — Encounter (HOSPITAL_COMMUNITY): Payer: Self-pay | Admitting: Psychiatry

## 2013-09-19 VITALS — BP 130/90 | Ht 64.0 in | Wt 137.0 lb

## 2013-09-19 DIAGNOSIS — F410 Panic disorder [episodic paroxysmal anxiety] without agoraphobia: Secondary | ICD-10-CM

## 2013-09-19 DIAGNOSIS — F319 Bipolar disorder, unspecified: Secondary | ICD-10-CM

## 2013-09-19 MED ORDER — PROPRANOLOL HCL 10 MG PO TABS: 10.0000 mg | ORAL_TABLET | Freq: Three times a day (TID) | ORAL | Status: DC

## 2013-09-19 MED ORDER — RISPERIDONE 3 MG PO TABS: 3.0000 mg | ORAL_TABLET | Freq: Every day | ORAL | Status: DC

## 2013-09-19 MED ORDER — BUPROPION HCL ER (XL) 300 MG PO TB24: 300.0000 mg | ORAL_TABLET | Freq: Every day | ORAL | Status: DC

## 2013-09-19 MED ORDER — ZOLPIDEM TARTRATE 10 MG PO TABS: 10.0000 mg | ORAL_TABLET | Freq: Every evening | ORAL | Status: DC | PRN

## 2013-09-19 MED ORDER — BENZTROPINE MESYLATE 1 MG PO TABS: 1.0000 mg | ORAL_TABLET | Freq: Every day | ORAL | Status: DC
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
## 2013-09-20 ENCOUNTER — Ambulatory Visit (HOSPITAL_COMMUNITY): Payer: Self-pay | Admitting: Psychiatry

## 2013-10-20 ENCOUNTER — Other Ambulatory Visit (HOSPITAL_COMMUNITY): Payer: Self-pay | Admitting: Psychiatry

## 2013-10-20 ENCOUNTER — Telehealth (HOSPITAL_COMMUNITY): Payer: Self-pay | Admitting: *Deleted

## 2013-10-20 DIAGNOSIS — F319 Bipolar disorder, unspecified: Secondary | ICD-10-CM

## 2013-10-20 MED ORDER — BUPROPION HCL ER (XL) 300 MG PO TB24: 300.0000 mg | ORAL_TABLET | Freq: Every day | ORAL | Status: DC

## 2013-10-20 NOTE — Telephone Encounter (Signed)
Will sent Wellbutrin xl to [facility]

## 2013-10-20 NOTE — Telephone Encounter (Signed)
Will sent wellbutrin xl to [facility]

## 2013-11-16 ENCOUNTER — Ambulatory Visit (INDEPENDENT_AMBULATORY_CARE_PROVIDER_SITE_OTHER): Payer: Commercial Managed Care - HMO | Admitting: Psychiatry

## 2013-11-16 ENCOUNTER — Encounter (HOSPITAL_COMMUNITY): Payer: Self-pay | Admitting: Psychiatry

## 2013-11-16 VITALS — BP 130/80 | Ht 64.0 in | Wt 135.0 lb

## 2013-11-16 DIAGNOSIS — F410 Panic disorder [episodic paroxysmal anxiety] without agoraphobia: Secondary | ICD-10-CM

## 2013-11-16 DIAGNOSIS — F29 Unspecified psychosis not due to a substance or known physiological condition: Secondary | ICD-10-CM

## 2013-11-16 DIAGNOSIS — F319 Bipolar disorder, unspecified: Secondary | ICD-10-CM

## 2013-11-16 MED ORDER — BUPROPION HCL ER (XL) 300 MG PO TB24: 300.0000 mg | ORAL_TABLET | Freq: Every day | ORAL | Status: DC
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
## 2014-02-06 ENCOUNTER — Emergency Department (HOSPITAL_COMMUNITY): Payer: Medicare HMO

## 2014-02-06 ENCOUNTER — Encounter (HOSPITAL_COMMUNITY): Payer: Self-pay | Admitting: Emergency Medicine

## 2014-02-06 ENCOUNTER — Emergency Department (HOSPITAL_COMMUNITY)
Admission: EM | Admit: 2014-02-06 | Discharge: 2014-02-06 | Disposition: A | Discharge status: Home / Self Care | Payer: Medicare HMO | Attending: Emergency Medicine | Admitting: Emergency Medicine

## 2014-02-06 DIAGNOSIS — IMO0002 Reserved for concepts with insufficient information to code with codable children: Secondary | ICD-10-CM | POA: Insufficient documentation

## 2014-02-06 DIAGNOSIS — E079 Disorder of thyroid, unspecified: Secondary | ICD-10-CM | POA: Insufficient documentation

## 2014-02-06 DIAGNOSIS — Z79899 Other long term (current) drug therapy: Secondary | ICD-10-CM | POA: Insufficient documentation

## 2014-02-06 DIAGNOSIS — Y929 Unspecified place or not applicable: Secondary | ICD-10-CM | POA: Insufficient documentation

## 2014-02-06 DIAGNOSIS — F329 Major depressive disorder, single episode, unspecified: Secondary | ICD-10-CM | POA: Insufficient documentation

## 2014-02-06 DIAGNOSIS — S46912A Strain of unspecified muscle, fascia and tendon at shoulder and upper arm level, left arm, initial encounter: Secondary | ICD-10-CM

## 2014-02-06 DIAGNOSIS — Y9389 Activity, other specified: Secondary | ICD-10-CM | POA: Insufficient documentation

## 2014-02-06 DIAGNOSIS — W010XXA Fall on same level from slipping, tripping and stumbling without subsequent striking against object, initial encounter: Secondary | ICD-10-CM | POA: Insufficient documentation

## 2014-02-06 DIAGNOSIS — Z87891 Personal history of nicotine dependence: Secondary | ICD-10-CM | POA: Insufficient documentation

## 2014-02-06 DIAGNOSIS — F3289 Other specified depressive episodes: Secondary | ICD-10-CM | POA: Insufficient documentation

## 2014-02-06 NOTE — ED Notes (Signed)
Pt fell a week ago but started having pain in L. Arm/shoulder this past Friday. Pt had previous sprain to that arm/shoulder about 56yrs ago per pt's husband.

## 2014-02-06 NOTE — ED Provider Notes (Signed)
CSN: [id number]     Arrival date & time 02/06/14  1257 History  This chart was scribed for Richarda Blade, MD by Roe Coombs, ED Scribe. The patient was seen in room APFT21/APFT21. Patient's care was started at 1:58 PM.  Chief Complaint  Patient presents with  . Arm Pain    The history is provided by the patient. No language interpreter was used.    HPI Comments: [name] is a 53 y.o. female who presents to the Emergency Department complaining of gradually worsening, constant, aching left upper arm pain that began 3 days ago. Pain is worse with movement. Patient states that 2 weeks ago, she tripped and fell bruising both her right hip and hitting her left arm. She has taken Tylenol at home for pain with mild improvement. She has a past history of left elbow sprain. She denies neck pain, numbness or weakness of the extremities, fever, or any other symptoms at this time. Patient's medical history includes hypothyroidism and depression.    Past Medical History  Diagnosis Date  . Mania   . Depression   . Thyroid disease   . ZWCHENID(782.4)    Past Surgical History  Procedure Laterality Date  . Tubal ligation     Family History  Problem Relation Age of Onset  . OCD Mother   . Dementia Father   . Bipolar disorder Sister   . Drug abuse Sister   . Alcohol abuse Sister   . Anxiety disorder Neg Hx   . Depression Neg Hx   . Paranoid behavior Neg Hx   . Schizophrenia Neg Hx   . Seizures Neg Hx   . Sexual abuse Neg Hx   . Physical abuse Neg Hx   . ADD / ADHD Other    History  Substance Use Topics  . Smoking status: Former Research scientist (life sciences)  . Smokeless tobacco: Not on file  . Alcohol Use: No   OB History   Grav Para Term Preterm Abortions TAB SAB Ect Mult Living                 Review of Systems  Constitutional: Negative for fever.  Musculoskeletal: Positive for myalgias (left upper arm pain). Negative for neck pain.  Neurological: Negative for weakness and numbness.  All  other systems reviewed and are negative.    Allergies  Buspar; Cymbalta; Hydroxyzine; and Sertraline  Home Medications   Prior to Admission medications   Medication Sig Start Date End Date Taking? Authorizing Provider  acetaminophen-codeine (TYLENOL #3) 300-30 MG per tablet Take 1 tablet by mouth as needed.      Historical Provider, MD  benztropine (COGENTIN) 1 MG tablet Take 1 tablet (1 mg total) by mouth daily. 09/19/13   Levonne Spiller, MD  buPROPion (WELLBUTRIN XL) 300 MG 24 hr tablet Take 1 tablet (300 mg total) by mouth daily. 11/16/13   Levonne Spiller, MD  butalbital-acetaminophen-caffeine (ESGIC PLUS) 50-500-40 MG per tablet Take 1 tablet by mouth as needed.      Historical Provider, MD  levothyroxine (SYNTHROID, LEVOTHROID) 88 MCG tablet  09/22/13   Historical Provider, MD  methocarbamol (ROBAXIN) 750 MG tablet  12/17/11   Historical Provider, MD  propranolol (INDERAL) 10 MG tablet Take 1 tablet (10 mg total) by mouth 3 (three) times daily. 09/19/13   Levonne Spiller, MD  risperiDONE (RISPERDAL) 3 MG tablet Take 1 tablet (3 mg total) by mouth at bedtime. 09/19/13 09/19/14  Levonne Spiller, MD  zolpidem (AMBIEN) 10 MG tablet Take  1 tablet (10 mg total) by mouth at bedtime as needed for sleep. 09/19/13 10/19/13  Levonne Spiller, MD   Triage Vitals: BP 145/90  Pulse 64  Temp(Src) 98 F (36.7 C) (Oral)  Wt 135 lb (61.236 kg)  SpO2 100%  LMP 12/07/2013 Physical Exam  Nursing note and vitals reviewed. Constitutional: She is oriented to person, place, and time. She appears well-developed and well-nourished.  HENT:  Head: Normocephalic and atraumatic.  Right Ear: External ear normal.  Left Ear: External ear normal.  Eyes: Conjunctivae and EOM are normal. Pupils are equal, round, and reactive to light.  Neck: Normal range of motion and phonation normal. Neck supple.  Cardiovascular: Normal rate, regular rhythm, normal heart sounds and intact distal pulses.   Pulmonary/Chest: Effort normal and breath sounds  normal. She exhibits no bony tenderness.  Abdominal: Soft. There is no tenderness.  Musculoskeletal: She exhibits tenderness.  Normal range of motion of the left shoulder and elbow. Mild anterior shoulder tenderness and medial upper arm tenderness. Neurovascularly intact.  Neurological: She is alert and oriented to person, place, and time. No cranial nerve deficit or sensory deficit. She exhibits normal muscle tone. Coordination normal.  Skin: Skin is warm, dry and intact.  Psychiatric: She has a normal mood and affect. Her behavior is normal. Judgment and thought content normal.    ED Course  Procedures (including critical care time)   Patient Vitals for the past 24 hrs:  BP Temp Temp src Pulse SpO2 Weight  02/06/14 1302 145/90 mmHg 98 F (36.7 C) Oral 64 100 % 135 lb (61.236 kg)    COORDINATION OF CARE: 2:02 PM- X-rays negative for fracture. Feel pain is due to muscle sprain in the shoulder. Advised home care of ice, heat and ibuprofen. Patient informed of current plan for treatment and evaluation and agrees with plan at this time.   Dg Elbow Complete Left  02/06/2014   CLINICAL DATA:  Left elbow pain since falling 2 weeks ago. Increasing pain over the last several days.  EXAM: LEFT ELBOW - COMPLETE 3+ VIEW  COMPARISON:  None.  FINDINGS: The mineralization and alignment are normal. There is no evidence of acute fracture or dislocation. There is no elbow joint effusion or evidence of foreign body. Mild spurring of the coronoid process is noted.  IMPRESSION: No acute osseous findings.   Electronically Signed   By: Camie Patience M.D.   On: 02/06/2014 14:02   Dg Shoulder Left  02/06/2014   CLINICAL DATA:  Increasing shoulder pain after falling 2 weeks ago.  EXAM: LEFT SHOULDER - 2+ VIEW  COMPARISON:  None.  FINDINGS: The mineralization and alignment are normal. There is no evidence of acute fracture or dislocation. The subacromial space is preserved. There are no significant arthropathic  changes for age.  IMPRESSION: Negative.   Electronically Signed   By: Camie Patience M.D.   On: 02/06/2014 14:03       MDM   Final diagnoses:  Left shoulder strain    Shoulder strain, subacute without suspected fracture or significant muscle injury.  Nursing Notes Reviewed/ Care Coordinated Applicable Imaging Reviewed Interpretation of Laboratory Data incorporated into ED treatment  The patient appears reasonably screened and/or stabilized for discharge and I doubt any other medical condition or other [facility] requiring further screening, evaluation, or treatment in the ED at this time prior to discharge.  Plan: Home Medications- Advil; Home Treatments- Heat and gentle AROM exercises; return here if the recommended treatment, does not improve the  symptoms; Recommended follow up- PCP or Ortho prn  I personally performed the services described in this documentation, which was scribed in my presence. The recorded information has been reviewed and is accurate.     Richarda Blade, MD 02/06/14 [phone]

## 2014-02-06 NOTE — Discharge Instructions (Signed)
Use the sling for comfort. Use heat on the sore area 3-4 times a day. Do some gentle shoulder exercise, swing your left arm in a circle and move front and back; several times a day. Use Ibuprofen 400 mg 3 times a day for pain.    Joint Sprain A sprain is a tear or stretch in the ligaments that hold a joint together. Severe sprains may need as long as 3-6 weeks of immobilization and/or exercises to heal completely. Sprained joints should be rested and protected. If not, they can become unstable and prone to re-injury. Proper treatment can reduce your pain, shorten the period of disability, and reduce the risk of repeated injuries. TREATMENT   Rest and elevate the injured joint to reduce pain and swelling.  Apply ice packs to the injury for 20-30 minutes every 2-3 hours for the next 2-3 days.  Keep the injury wrapped in a compression bandage or splint as long as the joint is painful or as instructed by your caregiver.  Do not use the injured joint until it is completely healed to prevent re-injury and chronic instability. Follow the instructions of your caregiver.  Long-term sprain management may require exercises and/or treatment by a physical therapist. Taping or special braces may help stabilize the joint until it is completely better. SEEK MEDICAL CARE IF:   You develop increased pain or swelling of the joint.  You develop increasing redness and warmth of the joint.  You develop a fever.  It becomes stiff.  Your hand or foot gets cold or numb. Document Released: 10/09/2004 Document Revised: 11/24/2011 Document Reviewed: 09/18/2008 [facility] Patient Information 2014 [place], [place].

## 2014-02-06 NOTE — ED Notes (Addendum)
Pain lt arm at elbow, , fell 2 weeks ago, , began hurting few days ago.   Good radial pulse and distal sensation

## 2014-02-16 ENCOUNTER — Encounter (HOSPITAL_COMMUNITY): Payer: Self-pay | Admitting: Psychiatry

## 2014-02-16 ENCOUNTER — Ambulatory Visit (INDEPENDENT_AMBULATORY_CARE_PROVIDER_SITE_OTHER): Payer: Medicare HMO | Admitting: Psychiatry

## 2014-02-16 VITALS — BP 130/88 | Ht 64.0 in | Wt 127.0 lb

## 2014-02-16 DIAGNOSIS — F319 Bipolar disorder, unspecified: Secondary | ICD-10-CM

## 2014-02-16 DIAGNOSIS — F410 Panic disorder [episodic paroxysmal anxiety] without agoraphobia: Secondary | ICD-10-CM

## 2014-02-16 MED ORDER — BENZTROPINE MESYLATE 1 MG PO TABS: 1.0000 mg | ORAL_TABLET | Freq: Every day | ORAL | Status: DC

## 2014-02-16 MED ORDER — ZOLPIDEM TARTRATE 10 MG PO TABS: 10.0000 mg | ORAL_TABLET | Freq: Every evening | ORAL | Status: DC | PRN

## 2014-02-16 MED ORDER — PROPRANOLOL HCL 10 MG PO TABS: 10.0000 mg | ORAL_TABLET | Freq: Three times a day (TID) | ORAL | Status: DC

## 2014-02-16 MED ORDER — BUPROPION HCL ER (XL) 300 MG PO TB24: 300.0000 mg | ORAL_TABLET | Freq: Every day | ORAL | Status: DC

## 2014-02-16 MED ORDER — RISPERIDONE 3 MG PO TABS: 3.0000 mg | ORAL_TABLET | Freq: Every day | ORAL | Status: DC
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
## 2014-03-20 ENCOUNTER — Ambulatory Visit (INDEPENDENT_AMBULATORY_CARE_PROVIDER_SITE_OTHER): Payer: Medicare HMO | Admitting: Psychiatry

## 2014-03-20 DIAGNOSIS — F319 Bipolar disorder, unspecified: Secondary | ICD-10-CM
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
## 2014-03-20 NOTE — Patient Instructions (Signed)
Discussed orally 

## 2014-04-13 ENCOUNTER — Telehealth (HOSPITAL_COMMUNITY): Payer: Self-pay | Admitting: *Deleted

## 2014-04-17 ENCOUNTER — Ambulatory Visit (HOSPITAL_COMMUNITY): Payer: Self-pay | Admitting: Psychiatry

## 2014-05-04 ENCOUNTER — Ambulatory Visit (INDEPENDENT_AMBULATORY_CARE_PROVIDER_SITE_OTHER): Payer: Medicare HMO | Admitting: Psychiatry

## 2014-05-04 DIAGNOSIS — F319 Bipolar disorder, unspecified: Secondary | ICD-10-CM

## 2014-05-04 NOTE — Patient Instructions (Signed)
Discussed orally 

## 2014-05-04 NOTE — Progress Notes (Signed)
   THERAPIST PROGRESS NOTE  Session Time: Thursday 05/04/2014 3:15 PM - 4:10 PM  Participation Level: Active  Behavioral Response: CasualAlertAnxious and Depressed  Type of Therapy: Individual Therapy  Treatment Goals addressed: Improve ability to manage stress and anxiety   Interventions: Supportive  Summary: [name] is a 53 y.o. female who presents with who presents with a long-standing history of mood swings, anxiety, and depression and has been a patient in this practice since 2007. She has had one psychiatric hospitalization. She has attended therapy intermittently and resumed therapy a month ago after a 6 month absence. Patient is experiencing increased worry, anxiety, panic attacks, decreased appetite, and depressed mood.  Stressors include her youngest daughter restricting patient's involvement with her five-year-old grandson for whom patient has been the primary caretaker most of his life. She also has limited contact with daughter's 60 month old baby. She also worries about her husband's health as he has heart issues . Patient fears being alone.  Patient reports continued stress and anxiety since last session. She has been allowed to see her grandson once since last session. Patient continues to experience worry, panic attacks, sadness. and depressed mood. She expresses frustration regarding her relationship with her daughter and reports becoming depressed every time daughter refuses her requests to see grandson. Patient also is experiencing increased anxiety and fears doing things without her husband.   Suicidal/Homicidal: No  Therapist Response: Therapist works with patient to process feelings, identify ways to do activity scheduling, identify realistic expectations in interaction with daughter, practice a relaxation technique using visualization,   Plan: Return again in 4 weeks. Patient agrees to use plan your day handouts and bring to next session.  Diagnosis: Axis I:  Bipolar Disorder.    Axis II: No diagnosis    Monee Dembeck,Satia, LCSW 05/04/2014

## 2014-05-19 ENCOUNTER — Ambulatory Visit (INDEPENDENT_AMBULATORY_CARE_PROVIDER_SITE_OTHER): Payer: Medicare HMO | Admitting: Psychiatry

## 2014-05-19 ENCOUNTER — Encounter (HOSPITAL_COMMUNITY): Payer: Self-pay | Admitting: Psychiatry

## 2014-05-19 VITALS — BP 116/85 | HR 84 | Ht 64.0 in | Wt 118.8 lb

## 2014-05-19 DIAGNOSIS — F319 Bipolar disorder, unspecified: Secondary | ICD-10-CM

## 2014-05-19 MED ORDER — PROPRANOLOL HCL 10 MG PO TABS: 10.0000 mg | ORAL_TABLET | Freq: Three times a day (TID) | ORAL | Status: DC

## 2014-05-19 MED ORDER — ZOLPIDEM TARTRATE 10 MG PO TABS: 10.0000 mg | ORAL_TABLET | Freq: Every evening | ORAL | Status: DC | PRN

## 2014-05-19 MED ORDER — RISPERIDONE 3 MG PO TABS: 3.0000 mg | ORAL_TABLET | Freq: Every day | ORAL | Status: DC

## 2014-05-19 MED ORDER — BUPROPION HCL ER (XL) 300 MG PO TB24: 300.0000 mg | ORAL_TABLET | Freq: Every day | ORAL | Status: DC

## 2014-05-19 MED ORDER — BENZTROPINE MESYLATE 1 MG PO TABS: 1.0000 mg | ORAL_TABLET | Freq: Every day | ORAL | Status: DC
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
## 2014-05-25 ENCOUNTER — Encounter (HOSPITAL_COMMUNITY): Payer: Self-pay | Admitting: *Deleted

## 2014-05-25 NOTE — Progress Notes (Signed)
Prior Auth [id number] Zolpidem 10 mg  Pt ID #: [id number] Approved from 05-25-14 until 05-26-2015

## 2014-06-01 ENCOUNTER — Ambulatory Visit (HOSPITAL_COMMUNITY): Payer: Self-pay | Admitting: Psychiatry

## 2014-06-19 ENCOUNTER — Ambulatory Visit (INDEPENDENT_AMBULATORY_CARE_PROVIDER_SITE_OTHER): Payer: Medicare HMO | Admitting: Psychiatry

## 2014-06-19 DIAGNOSIS — F319 Bipolar disorder, unspecified: Secondary | ICD-10-CM

## 2014-06-19 DIAGNOSIS — F410 Panic disorder [episodic paroxysmal anxiety] without agoraphobia: Secondary | ICD-10-CM

## 2014-06-19 NOTE — Progress Notes (Signed)
   THERAPIST PROGRESS NOTE  Session Time: Monday 06/19/2014 1:00 PM - 1:53 PM  Participation Level: Active  Behavioral Response: CasualAlertAnxious  Type of Therapy:  Individual   Treatment Goals addressed: Improve ability to manage stress and anxiety  Interventions: CBT and Supportive  Summary: [name] is a 53 y.o. female who presents with a long-standing history of mood swings, anxiety, and depression and has been a patient in this practice since 2007. She has had one psychiatric hospitalization. She has attended therapy intermittently and resumed therapy a month ago after a 6 month absence. Patient is experiencing increased worry, anxiety, panic attacks, decreased appetite, and depressed mood. Stressors include her youngest daughter restricting patient's involvement with her five-year-old grandson for whom patient has been the primary caretaker most of his life. She also has limited contact with daughter's 93 month old baby. She also worries about her husband's health as he has heart issues . Patient fears being alone.    Patient reports improved mood since last session as her daughter now is allowing patient to see her grandchildren regularly. Patient provides after school and evening child care for both of her grandchildren. Patient remains cautious as she continues to fear her daughter may revert to old behavior pattern of not allowing patient to see the grandchildren. Patient reports increased involvement in activity. She continues to experience excessive worry and panic attacks.  Suicidal/Homicidal: No  Therapist Response: Therapist works with patient to process feelings, identify triggers of anxiety, identify physical sensations/thoughts/emotions/and behaviors when experiencing panic attack, practice a relaxation technique using diaphragmatic breathing, practice using an anxiety log  Plan: Return again in 4 weeks. Patient agrees to complete anxiety log and bring to next  session.  Diagnosis: Axis I: Bipolar disorder    Axis II: No diagnosis    Amelia Burgard,Nylia, LCSW 06/19/2014

## 2014-06-19 NOTE — Patient Instructions (Signed)
Discussed orally 

## 2014-07-03 ENCOUNTER — Other Ambulatory Visit: Payer: Self-pay

## 2014-07-03 DIAGNOSIS — Z1231 Encounter for screening mammogram for malignant neoplasm of breast: Secondary | ICD-10-CM

## 2014-07-13 ENCOUNTER — Ambulatory Visit
Admission: RE | Admit: 2014-07-13 | Discharge: 2014-07-13 | Disposition: A | Discharge status: Home / Self Care | Payer: Medicare HMO | Source: Ambulatory Visit

## 2014-07-13 ENCOUNTER — Encounter (INDEPENDENT_AMBULATORY_CARE_PROVIDER_SITE_OTHER): Payer: Self-pay

## 2014-07-13 DIAGNOSIS — Z1231 Encounter for screening mammogram for malignant neoplasm of breast: Secondary | ICD-10-CM

## 2014-07-18 ENCOUNTER — Telehealth: Payer: Self-pay | Admitting: Hematology and Oncology

## 2014-07-18 NOTE — Telephone Encounter (Signed)
LEFT MESSAGE FOR PATIENT AND GAVE NP APPT FOR 11/17 @ 9:+45 W/DR. [place]. CONTACT INFORMATION LEFT FOR PATIENT TO RETURN CALL TO CONFIRM MESSAGE/NP APPT.

## 2014-07-21 ENCOUNTER — Encounter (HOSPITAL_COMMUNITY): Payer: Self-pay | Admitting: Emergency Medicine

## 2014-07-21 ENCOUNTER — Emergency Department (HOSPITAL_COMMUNITY): Payer: Medicare HMO

## 2014-07-21 ENCOUNTER — Emergency Department (HOSPITAL_COMMUNITY)
Admission: EM | Admit: 2014-07-21 | Discharge: 2014-07-22 | Disposition: A | Discharge status: Home / Self Care | Payer: Medicare HMO | Attending: Emergency Medicine | Admitting: Emergency Medicine

## 2014-07-21 DIAGNOSIS — R160 Hepatomegaly, not elsewhere classified: Secondary | ICD-10-CM | POA: Diagnosis not present

## 2014-07-21 DIAGNOSIS — O9989 Other specified diseases and conditions complicating pregnancy, childbirth and the puerperium: Secondary | ICD-10-CM | POA: Diagnosis not present

## 2014-07-21 DIAGNOSIS — Z87891 Personal history of nicotine dependence: Secondary | ICD-10-CM | POA: Insufficient documentation

## 2014-07-21 DIAGNOSIS — E039 Hypothyroidism, unspecified: Secondary | ICD-10-CM | POA: Insufficient documentation

## 2014-07-21 DIAGNOSIS — F329 Major depressive disorder, single episode, unspecified: Secondary | ICD-10-CM | POA: Insufficient documentation

## 2014-07-21 DIAGNOSIS — R140 Abdominal distension (gaseous): Secondary | ICD-10-CM

## 2014-07-21 DIAGNOSIS — R188 Other ascites: Secondary | ICD-10-CM

## 2014-07-21 DIAGNOSIS — R109 Unspecified abdominal pain: Secondary | ICD-10-CM | POA: Insufficient documentation

## 2014-07-21 DIAGNOSIS — Z3201 Encounter for pregnancy test, result positive: Secondary | ICD-10-CM | POA: Insufficient documentation

## 2014-07-21 DIAGNOSIS — Z79899 Other long term (current) drug therapy: Secondary | ICD-10-CM | POA: Insufficient documentation

## 2014-07-21 DIAGNOSIS — Z3A00 Weeks of gestation of pregnancy not specified: Secondary | ICD-10-CM | POA: Diagnosis not present

## 2014-07-21 DIAGNOSIS — O219 Vomiting of pregnancy, unspecified: Secondary | ICD-10-CM | POA: Insufficient documentation

## 2014-07-21 DIAGNOSIS — Z3490 Encounter for supervision of normal pregnancy, unspecified, unspecified trimester: Secondary | ICD-10-CM

## 2014-07-21 DIAGNOSIS — R531 Weakness: Secondary | ICD-10-CM | POA: Diagnosis present

## 2014-07-21 LAB — CBC WITH DIFFERENTIAL/PLATELET
BASOS PCT: 0 % (ref 0–1)
Basophils Absolute: 0 10*3/uL (ref 0.0–0.1)
EOS ABS: 0 10*3/uL (ref 0.0–0.7)
Eosinophils Relative: 0 % (ref 0–5)
HCT: 30.8 % — ABNORMAL LOW (ref 36.0–46.0)
HEMOGLOBIN: 9.2 g/dL — AB (ref 12.0–15.0)
Lymphocytes Relative: 8 % — ABNORMAL LOW (ref 12–46)
Lymphs Abs: 1.5 10*3/uL (ref 0.7–4.0)
MCH: 22.6 pg — AB (ref 26.0–34.0)
MCHC: 29.9 g/dL — AB (ref 30.0–36.0)
MCV: 75.7 fL — ABNORMAL LOW (ref 78.0–100.0)
MONOS PCT: 11 % (ref 3–12)
Monocytes Absolute: 2.1 10*3/uL — ABNORMAL HIGH (ref 0.1–1.0)
NEUTROS PCT: 81 % — AB (ref 43–77)
Neutro Abs: 15.7 10*3/uL — ABNORMAL HIGH (ref 1.7–7.7)
PLATELETS: 409 10*3/uL — AB (ref 150–400)
RBC: 4.07 MIL/uL (ref 3.87–5.11)
RDW: 15.4 % (ref 11.5–15.5)
WBC: 19.3 10*3/uL — ABNORMAL HIGH (ref 4.0–10.5)

## 2014-07-21 LAB — COMPREHENSIVE METABOLIC PANEL
ALBUMIN: 2.4 g/dL — AB (ref 3.5–5.2)
ALK PHOS: 138 U/L — AB (ref 39–117)
ALT: 15 U/L (ref 0–35)
ANION GAP: 16 — AB (ref 5–15)
AST: 20 U/L (ref 0–37)
BUN: 11 mg/dL (ref 6–23)
CO2: 24 mEq/L (ref 19–32)
CREATININE: 0.67 mg/dL (ref 0.50–1.10)
Calcium: 9.1 mg/dL (ref 8.4–10.5)
Chloride: 101 mEq/L (ref 96–112)
GFR calc Af Amer: >90 mL/min (ref >90)
GFR calc non Af Amer: >90 mL/min (ref >90)
Glucose, Bld: 96 mg/dL (ref 70–99)
Potassium: 3.8 mEq/L (ref 3.7–5.3)
Sodium: 141 mEq/L (ref 137–147)
Total Bilirubin: 0.6 mg/dL (ref 0.3–1.2)
Total Protein: 6.1 g/dL (ref 6.0–8.3)

## 2014-07-21 LAB — URINALYSIS, ROUTINE W REFLEX MICROSCOPIC
Glucose, UA: NEGATIVE mg/dL
HGB URINE DIPSTICK: NEGATIVE
Ketones, ur: 15 mg/dL — AB
Nitrite: NEGATIVE
PH: 5 (ref 5.0–8.0)
Protein, ur: 30 mg/dL — AB
Specific Gravity, Urine: 1.027 (ref 1.005–1.030)
Urobilinogen, UA: 0.2 mg/dL (ref 0.0–1.0)

## 2014-07-21 LAB — I-STAT CG4 LACTIC ACID, ED: Lactic Acid, Venous: 0.92 mmol/L (ref 0.5–2.2)

## 2014-07-21 LAB — I-STAT BETA HCG BLOOD, ED (MC, WL, AP ONLY): I-stat hCG, quantitative: 86.9 m[IU]/mL — ABNORMAL HIGH (ref <5)

## 2014-07-21 LAB — URINE MICROSCOPIC-ADD ON

## 2014-07-21 LAB — PREGNANCY, URINE: Preg Test, Ur: POSITIVE — AB

## 2014-07-21 LAB — TROPONIN I

## 2014-07-21 LAB — LIPASE, BLOOD: LIPASE: 8 U/L — AB (ref 11–59)

## 2014-07-21 LAB — CBG MONITORING, ED: Glucose-Capillary: 88 mg/dL (ref 70–99)

## 2014-07-21 MED ORDER — IOHEXOL 300 MG/ML  SOLN
25.0000 mL | INTRAMUSCULAR | Status: AC
  Administered 2014-07-21: 25 mL via ORAL

## 2014-07-21 MED ORDER — SODIUM CHLORIDE 0.9 % IV BOLUS (SEPSIS)
1000.0000 mL | Freq: Once | INTRAVENOUS | Status: AC
  Administered 2014-07-21: 1000 mL via INTRAVENOUS

## 2014-07-21 MED ORDER — SODIUM CHLORIDE 0.9 % IV BOLUS (SEPSIS)
500.0000 mL | Freq: Once | INTRAVENOUS | Status: AC
Start: 2014-07-21 — End: 2014-07-21
  Administered 2014-07-21: 500 mL via INTRAVENOUS

## 2014-07-21 MED ORDER — ONDANSETRON HCL 4 MG/2ML IJ SOLN
4.0000 mg | Freq: Once | INTRAMUSCULAR | Status: AC
  Administered 2014-07-21: 4 mg via INTRAVENOUS
  Filled 2014-07-21: qty 2

## 2014-07-21 NOTE — ED Provider Notes (Signed)
CSN: [id number]     Arrival date & time 07/21/14  1332 History   First MD Initiated Contact with Patient 07/21/14 1613     Chief Complaint  Patient presents with  . Weakness     (Consider location/radiation/quality/duration/timing/severity/associated sxs/prior Treatment) HPI Comments: Patient presents to the ER for evaluation of generalized weakness. Patient reports that her symptoms began 2 weeks ago. Patient reports that she has not had a bowel movement in 2 weeks. She has had progressive distention of her abdomen with pain which has now become constant. Pain is mostly on the right side of the abdomen. She has had nausea and vomiting. Patient has had decreased oral intake and has noticed decreased urination. She has not had fever, chest pain, cough, shortness of breath.  Patient is a 53 y.o. female presenting with weakness.  Weakness Associated symptoms include abdominal pain. Pertinent negatives include no chest pain and no shortness of breath.    Past Medical History  Diagnosis Date  . Mania   . Depression   . Thyroid disease   . [id number](756.4)    Past Surgical History  Procedure Laterality Date  . Tubal ligation     Family History  Problem Relation Age of Onset  . OCD Mother   . Dementia Father   . Bipolar disorder Sister   . Drug abuse Sister   . Alcohol abuse Sister   . Anxiety disorder Neg Hx   . Depression Neg Hx   . Paranoid behavior Neg Hx   . Schizophrenia Neg Hx   . Seizures Neg Hx   . Sexual abuse Neg Hx   . Physical abuse Neg Hx   . ADD / ADHD Other    History  Substance Use Topics  . Smoking status: Former Research scientist (life sciences)  . Smokeless tobacco: Not on file  . Alcohol Use: No   OB History    No data available     Review of Systems  Respiratory: Negative for shortness of breath.   Cardiovascular: Negative for chest pain.  Gastrointestinal: Positive for nausea, vomiting, abdominal pain and constipation.  Neurological: Positive for weakness.  All other  systems reviewed and are negative.     Allergies  Buspar; Cymbalta; Hydroxyzine; and Sertraline  Home Medications   Prior to Admission medications   Medication Sig Start Date End Date Taking? Authorizing Provider  acetaminophen-codeine (TYLENOL #3) 300-30 MG per tablet Take 1 tablet by mouth as needed (pain).    Yes Historical Provider, MD  benztropine (COGENTIN) 1 MG tablet Take 1 tablet (1 mg total) by mouth daily. Patient taking differently: Take 1 mg by mouth at bedtime.  05/19/14  Yes Levonne Spiller, MD  buPROPion (WELLBUTRIN XL) 300 MG 24 hr tablet Take 1 tablet (300 mg total) by mouth daily. 05/19/14  Yes Levonne Spiller, MD  butalbital-acetaminophen-caffeine (ESGIC PLUS) 50-500-40 MG per tablet Take 1 tablet by mouth as needed for headache.    Yes Historical Provider, MD  levothyroxine (SYNTHROID, LEVOTHROID) 88 MCG tablet Take 88 mcg by mouth at bedtime.  09/22/13  Yes Historical Provider, MD  propranolol (INDERAL) 10 MG tablet Take 1 tablet (10 mg total) by mouth 3 (three) times daily. 05/19/14  Yes Levonne Spiller, MD  risperiDONE (RISPERDAL) 3 MG tablet Take 1 tablet (3 mg total) by mouth at bedtime. 05/19/14 05/19/15 Yes Levonne Spiller, MD  zolpidem (AMBIEN) 10 MG tablet Take 1 tablet (10 mg total) by mouth at bedtime as needed for sleep. 05/19/14 08/19/14 Yes Levonne Spiller, MD  methocarbamol (ROBAXIN) 750 MG tablet  12/17/11   Historical Provider, MD   BP 147/77 mmHg  Pulse 91  Temp(Src) 97.7 F (36.5 C) (Oral)  Resp 26  SpO2 99% Physical Exam  Constitutional: She is oriented to person, place, and time. She appears well-developed and well-nourished. No distress.  HENT:  Head: Normocephalic and atraumatic.  Right Ear: Hearing normal.  Left Ear: Hearing normal.  Nose: Nose normal.  Mouth/Throat: Oropharynx is clear and moist and mucous membranes are normal.  Eyes: Conjunctivae and EOM are normal. Pupils are equal, round, and reactive to light.  Neck: Normal range of motion. Neck supple.   Cardiovascular: Regular rhythm, S1 normal and S2 normal.  Exam reveals no gallop and no friction rub.   No murmur heard. Pulmonary/Chest: Effort normal and breath sounds normal. No respiratory distress. She exhibits no tenderness.  Abdominal: Soft. Normal appearance and bowel sounds are normal. She exhibits distension. There is no hepatosplenomegaly. There is tenderness in the right upper quadrant and epigastric area. There is no rebound, no guarding, no tenderness at McBurney's point and negative Murphy's sign. No hernia.  Musculoskeletal: Normal range of motion.  Neurological: She is alert and oriented to person, place, and time. She has normal strength. No cranial nerve deficit or sensory deficit. Coordination normal. GCS eye subscore is 4. GCS verbal subscore is 5. GCS motor subscore is 6.  Skin: Skin is warm, dry and intact. No rash noted. No cyanosis.  Psychiatric: She has a normal mood and affect. Her speech is normal and behavior is normal. Thought content normal.    ED Course  Procedures (including critical care time) Labs Review Labs Reviewed  CBC WITH DIFFERENTIAL - Abnormal; Notable for the following:    WBC 19.3 (*)    Hemoglobin 9.2 (*)    HCT 30.8 (*)    MCV 75.7 (*)    MCH 22.6 (*)    MCHC 29.9 (*)    Platelets 409 (*)    Neutrophils Relative % 81 (*)    Neutro Abs 15.7 (*)    Lymphocytes Relative 8 (*)    Monocytes Absolute 2.1 (*)    All other components within normal limits  COMPREHENSIVE METABOLIC PANEL - Abnormal; Notable for the following:    Albumin 2.4 (*)    Alkaline Phosphatase 138 (*)    Anion gap 16 (*)    All other components within normal limits  URINALYSIS, ROUTINE W REFLEX MICROSCOPIC - Abnormal; Notable for the following:    Color, Urine AMBER (*)    APPearance TURBID (*)    Bilirubin Urine SMALL (*)    Ketones, ur 15 (*)    Protein, ur 30 (*)    Leukocytes, UA TRACE (*)    All other components within normal limits  LIPASE, BLOOD - Abnormal;  Notable for the following:    Lipase 8 (*)    All other components within normal limits  URINE MICROSCOPIC-ADD ON - Abnormal; Notable for the following:    Squamous Epithelial / LPF FEW (*)    Bacteria, UA FEW (*)    All other components within normal limits  PREGNANCY, URINE - Abnormal; Notable for the following:    Preg Test, Ur POSITIVE (*)    All other components within normal limits  I-STAT BETA HCG BLOOD, ED ([facility], [facility], [facility] ONLY) - Abnormal; Notable for the following:    I-stat hCG, quantitative 86.9 (*)    All other components within normal limits  TROPONIN I  CBG  MONITORING, ED  I-STAT CG4 LACTIC ACID, ED    Imaging Review US Abdomen Complete  07/21/2014   CLINICAL DATA:  Abdominal distention.  EXAM: ULTRASOUND ABDOMEN COMPLETE  COMPARISON:  None.  FINDINGS: Gallbladder: Gallbladder is not identified and may be contracted or surgically absent.  Common bile duct: Diameter: 3.7 mm, normal  Liver: Hepatic parenchymal echotexture is diffusely heterogeneous with nodular contour suggesting cirrhosis. Focal poorly defined hypoechoic nodules are demonstrated in the liver, measuring up to about 2.7 cm diameter. Appearance is worrisome for primary or metastatic neoplasm.  IVC: No abnormality visualized.  Pancreas: Not well visualized due to overlying bowel gas.  Spleen: Spleen is enlarged, measuring 16.7 cm length with volume calculated at 1022 mL.  Right Kidney: Length: 12.7 cm. Echogenicity within normal limits. No mass or hydronephrosis visualized.  Left Kidney: Length: 12.6 cm. Echogenicity within normal limits. No mass or hydronephrosis visualized.  Abdominal aorta: No aneurysm visualized.  Other findings: Small amount of free fluid in the upper abdomen consistent with ascites. Small bilateral pleural effusions noted.  IMPRESSION: Changes of hepatic cirrhosis with focal nodules in the liver worrisome for primary or metastatic disease. Splenic enlargement. Mild ascites. Small pleural effusions.    Electronically Signed   By: Lucienne Capers M.D.   On: 07/21/2014 23:03   US Ob Comp Less 14 Wks  07/21/2014   CLINICAL DATA:  Abdominal distention. Estimated gestational age by LMP is 9 weeks 5 days. Quantitative beta HCG is 86. History of tubal ligations.  EXAM: OBSTETRIC <14 WK [place] AND TRANSVAGINAL OB US  TECHNIQUE: Both transabdominal and transvaginal ultrasound examinations were performed for complete evaluation of the gestation as well as the maternal uterus, adnexal regions, and pelvic cul-de-sac. Transvaginal technique was performed to assess early pregnancy.  COMPARISON:  None.  FINDINGS: Intrauterine gestational sac: No intrauterine gestational sac is identified.  Yolk sac:  Not identified.  Embryo:  Not identified.  Cardiac Activity: Not identified.  Heart Rate:   bpm  Maternal uterus/adnexae: There is a large amount of free fluid demonstrated in the pelvis with floating bowel loops. Fluid appears to be simple without significant debris. Uterus is anteverted. Heterogeneous myometrium with focal anterior fibroid measuring about 1.3 cm maximal diameter. Endometrium is not separately distinguished from the heterogeneous myometrium. Ovaries are not identified.  IMPRESSION: Large amount of free fluid in the pelvis. No intrauterine gestational sac, yolk sac, or fetal pole identified. Differential considerations include intrauterine pregnancy too early to be sonographically visualized, missed abortion, or ectopic pregnancy. Followup ultrasound is recommended in 10-14 days for further evaluation.   Electronically Signed   By: Lucienne Capers M.D.   On: 07/21/2014 22:43   US Ob Transvaginal  07/21/2014   CLINICAL DATA:  Abdominal distention. Estimated gestational age by LMP is 9 weeks 5 days. Quantitative beta HCG is 86. History of tubal ligations.  EXAM: OBSTETRIC <14 WK [place] AND TRANSVAGINAL OB US  TECHNIQUE: Both transabdominal and transvaginal ultrasound examinations were performed for complete evaluation  of the gestation as well as the maternal uterus, adnexal regions, and pelvic cul-de-sac. Transvaginal technique was performed to assess early pregnancy.  COMPARISON:  None.  FINDINGS: Intrauterine gestational sac: No intrauterine gestational sac is identified.  Yolk sac:  Not identified.  Embryo:  Not identified.  Cardiac Activity: Not identified.  Heart Rate:   bpm  Maternal uterus/adnexae: There is a large amount of free fluid demonstrated in the pelvis with floating bowel loops. Fluid appears to be simple without significant debris.  Uterus is anteverted. Heterogeneous myometrium with focal anterior fibroid measuring about 1.3 cm maximal diameter. Endometrium is not separately distinguished from the heterogeneous myometrium. Ovaries are not identified.  IMPRESSION: Large amount of free fluid in the pelvis. No intrauterine gestational sac, yolk sac, or fetal pole identified. Differential considerations include intrauterine pregnancy too early to be sonographically visualized, missed abortion, or ectopic pregnancy. Followup ultrasound is recommended in 10-14 days for further evaluation.   Electronically Signed   By: Lucienne Capers M.D.   On: 07/21/2014 22:43   Dg Abd Acute W/chest  07/21/2014   CLINICAL DATA:  Abdominal pain, nausea and vomiting for 2 weeks  EXAM: ACUTE ABDOMEN SERIES (ABDOMEN 2 VIEW & CHEST 1 VIEW)  COMPARISON:  None.  FINDINGS: Mild enlargement of the cardiac silhouette is noted. Trace left pleural fluid is present. Diffusely prominent interstitial markings are noted without focal pulmonary opacity. No free air beneath the diaphragms.  Loops of bowel are mildly centrally displaced which may be seen with ascites or organomegaly. No dilated gas-filled loop of small or large bowel is identified. No differential air-fluid level. No acute osseous finding.  IMPRESSION: Diffusely prominent interstitial lung markings without focal pulmonary opacity.  Nonobstructing bowel gas pattern with central  displacement of loops of bowel which may indicate ascites or organomegaly.   Electronically Signed   By: Conchita Paris M.D.   On: 07/21/2014 17:27     EKG Interpretation   Date/Time:  Friday July 21 2014 16:22:37 EST Ventricular Rate:  85 PR Interval:  111 QRS Duration: 71 QT Interval:  382 QTC Calculation: 454 R Axis:   44 Text Interpretation:  Sinus rhythm Borderline short PR interval Borderline  T abnormalities, diffuse leads No previous tracing Confirmed by POLLINA   MD, CHRISTOPHER [phone]) on 07/21/2014 4:46:30 PM      MDM   Abdominal pain Vomiting Ascites Liver metastasis Elevated beta hCG  Patient presents to the ER for evaluation of generalized weakness, dry mouth, nausea, vomiting and abdominal pain. Symptoms ongoing for 2 weeks. Patient reports that she has had significant weight loss recently and now feels like her abdomen is distended. Her doctor performed blood work last week and found that she had elevated white blood cells and anemia, referred her to hematology. Appointment is set for November 17.  Patient's blood work confirmed leukocytosis and mild anemia. Comprehensive metabolic panel was normal. Plain film x-ray of the abdomen revealed displacement of loops of bowel that could indicate ascites or organomegaly. Because of this I considered performing a CT scan of her abdomen to further evaluate. Because of her abdominal distention, I opted to perform a urine pregnancy test prior to exam. This came back positive. This seemed very unlikely, as the patient is 53 years old, perimenopausal and has had a previous tubal ligation. She also reports she is not sexually active. A beta-hCG was found to be 86. Based on this and was concerned about hormone secreting tumor such as ovarian tumor. Patient was sent to radiology for abdominal ultrasound to look for ascites as well as pelvic ultrasound. Pelvic ultrasound did not show any obvious masses, patient does have evidence of  possible metastatic or primary liver lesions on ultrasound and a small amount of ascites.  Based on these findings I discussed the patient with Dr. Harolyn Rutherford,  on call for OB/GYN.he recommended symptomatically treatment for the remainder of the weekend and follow-up at gynecology oncology clinic on Monday.  I discussed the findings with the patient and her family.  I did share with her my concern that she. Possibly has metastatic cancer, possibly ovarian in origin. She will be discharged with Zofran, Phenergan, Percocet. She has the number to call the oncology clinic in the morning on Monday. She is told to return to the ER if she has any worsening symptoms, was told to go to [facility], as that is generally where oncology patients generally present.    Orpah Greek, MD 07/21/14 [phone]

## 2014-07-21 NOTE — Discharge Instructions (Signed)
Abdominal Pain °Many things can cause abdominal pain. Usually, abdominal pain is not caused by a disease and will improve without treatment. It can often be observed and treated at home. Your health care provider will do a physical exam and possibly order blood tests and X-rays to help determine the seriousness of your pain. However, in many cases, more time must pass before a clear cause of the pain can be found. Before that point, your health care provider may not know if you need more testing or further treatment. °HOME CARE INSTRUCTIONS  °Monitor your abdominal pain for any changes. The following actions may help to alleviate any discomfort you are experiencing: °· Only take over-the-counter or prescription medicines as directed by your health care provider. °· Do not take laxatives unless directed to do so by your health care provider. °· Try a clear liquid diet (broth, tea, or water) as directed by your health care provider. Slowly move to a bland diet as tolerated. °SEEK MEDICAL CARE IF: °· You have unexplained abdominal pain. °· You have abdominal pain associated with nausea or diarrhea. °· You have pain when you urinate or have a bowel movement. °· You experience abdominal pain that wakes you in the night. °· You have abdominal pain that is worsened or improved by eating food. °· You have abdominal pain that is worsened with eating fatty foods. °· You have a fever. °SEEK IMMEDIATE MEDICAL CARE IF:  °· Your pain does not go away within 2 hours. °· You keep throwing up (vomiting). °· Your pain is felt only in portions of the abdomen, such as the right side or the left lower portion of the abdomen. °· You pass bloody or black tarry stools. °MAKE SURE YOU: °· Understand these instructions.   °· Will watch your condition.   °· Will get help right away if you are not doing well or get worse.   °Document Released: 06/11/2005 Document Revised: 09/06/2013 Document Reviewed: 05/11/2013 °Exit[employer]® Patient Information  ©2015 [employer]. This information is not intended to replace advice given to you by your health care provider. Make sure you discuss any questions you have with your health care provider. ° °Nausea and Vomiting °Nausea is a sick feeling that often comes before throwing up (vomiting). Vomiting is a reflex where stomach contents come out of your mouth. Vomiting can cause severe loss of body fluids (dehydration). Children and elderly adults can become dehydrated quickly, especially if they also have diarrhea. Nausea and vomiting are symptoms of a condition or disease. It is important to find the cause of your symptoms. °CAUSES  °· Direct irritation of the stomach lining. This irritation can result from increased acid production (gastroesophageal reflux disease), infection, food poisoning, taking certain medicines (such as nonsteroidal anti-inflammatory drugs), alcohol use, or tobacco use. °· Signals from the brain. These signals could be caused by a headache, heat exposure, an inner ear disturbance, increased pressure in the brain from injury, infection, a tumor, or a concussion, pain, emotional stimulus, or metabolic problems. °· An obstruction in the gastrointestinal tract (bowel obstruction). °· Illnesses such as diabetes, hepatitis, gallbladder problems, appendicitis, kidney problems, cancer, sepsis, atypical symptoms of a heart attack, or eating disorders. °· Medical treatments such as chemotherapy and radiation. °· Receiving medicine that makes you sleep (general anesthetic) during surgery. °DIAGNOSIS °Your caregiver may ask for tests to be done if the problems do not improve after a few days. Tests may also be done if symptoms are severe or if the reason for the nausea   and vomiting is not clear. Tests may include: °· Urine tests. °· Blood tests. °· Stool tests. °· Cultures (to look for evidence of infection). °· X-rays or other imaging studies. °Test results can help your caregiver make decisions about  treatment or the need for additional tests. °TREATMENT °You need to stay well hydrated. Drink frequently but in small amounts. You may wish to drink water, sports drinks, clear broth, or eat frozen ice pops or gelatin dessert to help stay hydrated. When you eat, eating slowly may help prevent nausea. There are also some antinausea medicines that may help prevent nausea. °HOME CARE INSTRUCTIONS  °· Take all medicine as directed by your caregiver. °· If you do not have an appetite, do not force yourself to eat. However, you must continue to drink fluids. °· If you have an appetite, eat a normal diet unless your caregiver tells you differently. °¨ Eat a variety of complex carbohydrates (rice, wheat, potatoes, bread), lean meats, yogurt, fruits, and vegetables. °¨ Avoid high-fat foods because they are more difficult to digest. °· Drink enough water and fluids to keep your urine clear or pale yellow. °· If you are dehydrated, ask your caregiver for specific rehydration instructions. Signs of dehydration may include: °¨ Severe thirst. °¨ Dry lips and mouth. °¨ Dizziness. °¨ Dark urine. °¨ Decreasing urine frequency and amount. °¨ Confusion. °¨ Rapid breathing or pulse. °SEEK IMMEDIATE MEDICAL CARE IF:  °· You have blood or brown flecks (like coffee grounds) in your vomit. °· You have black or bloody stools. °· You have a severe headache or stiff neck. °· You are confused. °· You have severe abdominal pain. °· You have chest pain or trouble breathing. °· You do not urinate at least once every 8 hours. °· You develop cold or clammy skin. °· You continue to vomit for longer than 24 to 48 hours. °· You have a fever. °MAKE SURE YOU:  °· Understand these instructions. °· Will watch your condition. °· Will get help right away if you are not doing well or get worse. °Document Released: 09/01/2005 Document Revised: 11/24/2011 Document Reviewed: 01/29/2011 °[employer]Care® Patient Information ©2015 [employer]. This information is not  intended to replace advice given to you by your health care provider. Make sure you discuss any questions you have with your health care provider. ° °

## 2014-07-21 NOTE — ED Notes (Addendum)
Feeling weak x 2 weeks constant dry mouth sluggish nausea and vomiting saw dr last week  They did blood work she is unsure of results speech is sluggish states no bm in 2 weeks and is not voiding a lot ,denies cp .cough or dysuria abd  Is large and distended

## 2014-07-21 NOTE — ED Notes (Signed)
The patient is unable to give an urine specimen at this time. The tech has reported to the RN in charge. 

## 2014-07-22 MED ORDER — OXYCODONE-ACETAMINOPHEN 5-325 MG PO TABS: 1.0000 | ORAL_TABLET | ORAL | Status: DC | PRN

## 2014-07-22 MED ORDER — ONDANSETRON HCL 4 MG PO TABS: 4.0000 mg | ORAL_TABLET | Freq: Four times a day (QID) | ORAL | Status: DC

## 2014-07-22 MED ORDER — PROMETHAZINE HCL 25 MG RE SUPP: 25.0000 mg | Freq: Four times a day (QID) | RECTAL | Status: DC | PRN

## 2014-07-24 ENCOUNTER — Telehealth: Payer: Self-pay | Admitting: Hematology

## 2014-07-24 NOTE — Telephone Encounter (Signed)
S/W PATIENT HUSBAND AND GAVE NP APPT FOR 11/10 @ 2:30 W/DR. [place]ENG.  DX- LIVER CA

## 2014-07-25 ENCOUNTER — Encounter: Payer: Self-pay | Admitting: Hematology

## 2014-07-25 ENCOUNTER — Ambulatory Visit (HOSPITAL_BASED_OUTPATIENT_CLINIC_OR_DEPARTMENT_OTHER): Payer: Medicare HMO | Admitting: Hematology

## 2014-07-25 ENCOUNTER — Ambulatory Visit: Payer: Medicare HMO

## 2014-07-25 ENCOUNTER — Telehealth: Payer: Self-pay | Admitting: Hematology

## 2014-07-25 ENCOUNTER — Other Ambulatory Visit: Payer: Self-pay | Admitting: Hematology

## 2014-07-25 ENCOUNTER — Ambulatory Visit (HOSPITAL_BASED_OUTPATIENT_CLINIC_OR_DEPARTMENT_OTHER): Payer: Medicare HMO

## 2014-07-25 VITALS — BP 98/76 | HR 88 | Temp 97.6°F | Resp 18 | Ht 64.0 in | Wt 125.8 lb

## 2014-07-25 DIAGNOSIS — R188 Other ascites: Secondary | ICD-10-CM

## 2014-07-25 DIAGNOSIS — D649 Anemia, unspecified: Secondary | ICD-10-CM | POA: Insufficient documentation

## 2014-07-25 DIAGNOSIS — I959 Hypotension, unspecified: Secondary | ICD-10-CM

## 2014-07-25 DIAGNOSIS — B379 Candidiasis, unspecified: Secondary | ICD-10-CM

## 2014-07-25 DIAGNOSIS — K7460 Unspecified cirrhosis of liver: Secondary | ICD-10-CM

## 2014-07-25 DIAGNOSIS — E46 Unspecified protein-calorie malnutrition: Secondary | ICD-10-CM

## 2014-07-25 DIAGNOSIS — R160 Hepatomegaly, not elsewhere classified: Secondary | ICD-10-CM | POA: Insufficient documentation

## 2014-07-25 LAB — CBC WITH DIFFERENTIAL/PLATELET
Basophils Absolute: 0 10*3/uL (ref 0.0–0.1)
Basophils Relative: 0 % (ref 0–1)
Eosinophils Absolute: 0 10*3/uL (ref 0.0–0.7)
Eosinophils Relative: 0 % (ref 0–5)
HEMATOCRIT: 27.1 % — AB (ref 36.0–46.0)
HEMOGLOBIN: 8 g/dL — AB (ref 12.0–15.0)
LYMPHS ABS: 1.3 10*3/uL (ref 0.7–4.0)
LYMPHS PCT: 7 % — AB (ref 12–46)
MCH: 22.4 pg — ABNORMAL LOW (ref 26.0–34.0)
MCHC: 29.5 g/dL — ABNORMAL LOW (ref 30.0–36.0)
MCV: 75.9 fL — ABNORMAL LOW (ref 78.0–100.0)
MONOS PCT: 12 % (ref 3–12)
Monocytes Absolute: 2.2 10*3/uL — ABNORMAL HIGH (ref 0.1–1.0)
NEUTROS ABS: 14.8 10*3/uL — AB (ref 1.7–7.7)
Neutrophils Relative %: 81 % — ABNORMAL HIGH (ref 43–77)
Platelets: 332 10*3/uL (ref 150–400)
RBC: 3.57 MIL/uL — ABNORMAL LOW (ref 3.87–5.11)
RDW: 16.1 % — ABNORMAL HIGH (ref 11.5–15.5)
WBC: 18.3 10*3/uL — ABNORMAL HIGH (ref 4.0–10.5)

## 2014-07-25 LAB — BASIC METABOLIC PANEL
BUN: 23 mg/dL (ref 6–23)
CO2: 24 mEq/L (ref 19–32)
Calcium: 8.7 mg/dL (ref 8.4–10.5)
Chloride: 104 mEq/L (ref 96–112)
Creatinine, Ser: 0.99 mg/dL (ref 0.50–1.10)
GLUCOSE: 96 mg/dL (ref 70–99)
Potassium: 4.4 mEq/L (ref 3.5–5.3)
Sodium: 142 mEq/L (ref 135–145)

## 2014-07-25 MED ORDER — SODIUM CHLORIDE 0.9 % IV SOLN
Freq: Once | INTRAVENOUS | Status: AC
  Administered 2014-07-25: 17:00:00 via INTRAVENOUS

## 2014-07-25 NOTE — Progress Notes (Signed)
[place] CONSULT NOTE  Patient Care Team: Lorene Dy, MD as PCP - General (Internal Medicine)  CHIEF COMPLAINTS/PURPOSE OF CONSULTATION:  Liver mass   HISTORY OF PRESENTING ILLNESS:  [name] is a  53 y.o. female with PMH significant for bipolar who is here because of newly discovered liver mass.   She was physically active and functioning well until about 10 weeks ago, she started feeling fatigue, and had multiple fall at home, no significant injury. She developed intermittent RUQ abdominal pain and abdominal distention about 2 weeks ago, associated worsening fatigue, poor appetite, nausea and intermittent vomiting. She also report mild dry cough and dyspnea on exertion. Her condition has deteriorated quickly in the past two weeks, and she spends most of her time in bed now. She went to ED on 07/21/14, was found to have (+) pregnancy test, elevated HCG, and liver US showed multiple liver masses, liver cirrhosis, and ascites. Vaginal [place] was negative for fetus or ovarian mass. She was give antiemetics zofran which helps with her nausea, and referred to our cancer center for further workup.   Her last menstrual period was 3 month ago, which was regular before. She has been experience hot flushes lately.    MEDICAL HISTORY:  Past Medical History  Diagnosis Date  . Mania   . Depression   . Thyroid disease   . Headache(784.0)     SURGICAL HISTORY: Past Surgical History  Procedure Laterality Date  . Tubal ligation      SOCIAL HISTORY: History   Social History  . Marital Status: Married    Spouse Name: N/A    Number of Children: N/A  . Years of Education: N/A   Occupational History  . Not on file.   Social History Main Topics  . Smoking status: Former Smoker    Types: Cigarettes    Quit date: 09/15/1989  . Smokeless tobacco: Not on file  . Alcohol Use: No  . Drug Use: No  . Sexual Activity: Yes    Birth Control/ Protection: Surgical   Other  Topics Concern  . Not on file   Social History Narrative    FAMILY HISTORY: Family History  Problem Relation Age of Onset  . OCD Mother   . Dementia Father   . Bipolar disorder Sister   . Drug abuse Sister   . Alcohol abuse Sister   . Anxiety disorder Neg Hx   . Depression Neg Hx   . Paranoid behavior Neg Hx   . Schizophrenia Neg Hx   . Seizures Neg Hx   . Sexual abuse Neg Hx   . Physical abuse Neg Hx   . ADD / ADHD Other   No family history of malignancy.   ALLERGIES:  is allergic to buspar; cymbalta; hydroxyzine; and sertraline.  MEDICATIONS:  Current Outpatient Prescriptions  Medication Sig Dispense Refill  . acetaminophen-codeine (TYLENOL #3) 300-30 MG per tablet Take 1 tablet by mouth as needed (pain).     . benztropine (COGENTIN) 1 MG tablet Take 1 tablet (1 mg total) by mouth daily. (Patient taking differently: Take 1 mg by mouth at bedtime. ) 90 tablet 2  . buPROPion (WELLBUTRIN XL) 300 MG 24 hr tablet Take 1 tablet (300 mg total) by mouth daily. 90 tablet 2  . butalbital-acetaminophen-caffeine (ESGIC PLUS) 50-500-40 MG per tablet Take 1 tablet by mouth as needed for headache.     . levothyroxine (SYNTHROID, LEVOTHROID) 88 MCG tablet Take 88 mcg by mouth at bedtime.     Marland Kitchen  ondansetron (ZOFRAN) 4 MG tablet Take 1 tablet (4 mg total) by mouth every 6 (six) hours. 30 tablet 0  . oxyCODONE-acetaminophen (PERCOCET) 5-325 MG per tablet Take 1-2 tablets by mouth every 4 (four) hours as needed. 20 tablet 0  . propranolol (INDERAL) 10 MG tablet Take 1 tablet (10 mg total) by mouth 3 (three) times daily. 270 tablet 2  . risperiDONE (RISPERDAL) 3 MG tablet Take 1 tablet (3 mg total) by mouth at bedtime. 90 tablet 2  . zolpidem (AMBIEN) 10 MG tablet Take 1 tablet (10 mg total) by mouth at bedtime as needed for sleep. 90 tablet 1   No current facility-administered medications for this visit.    REVIEW OF SYSTEMS:   Constitutional: Denies fevers, chills or abnormal night sweats.  She lost about 20 lbs in the past month but gained 10lbs back in the past few weeks Eyes: Denies blurriness of vision, double vision or watery eyes Ears, nose, mouth, throat, and face: Denies mucositis or sore throat Respiratory: Denies cough, dyspnea or wheezes Cardiovascular: Denies palpitation, chest discomfort or lower extremity swelling Gastrointestinal:  (+) nausea and vomiting, (+) bloating, RUQ pain, no BM for a week.  Skin: Denies abnormal skin rashes Lymphatics: Denies new lymphadenopathy or easy bruising Neurological:Denies numbness, tingling or new weaknesses Behavioral/Psych: Mood is stable, no new changes  All other systems were reviewed with the patient and are negative.  PHYSICAL EXAMINATION: ECOG PERFORMANCE STATUS: 3 - Symptomatic, >50% confined to bed  Filed Vitals:   07/25/14 1627  BP: 98/76  Pulse: 88  Temp:   Resp:    Filed Weights   07/25/14 1500  Weight: 125 lb 12.8 oz (57.063 kg)    GENERAL:alert, no distress, siting in wheelchair, appears cachetic  SKIN: skin color, texture, turgor are normal, no rashes or significant lesions EYES: normal, conjunctiva are pink and non-injected, sclera clear OROPHARYNX:no exudate, no erythema and lips, buccal mucosa, and tongue normal  NECK: supple, thyroid normal size, non-tender, without nodularity LYMPH:  no palpable lymphadenopathy in the cervical, axillary or inguinal LUNGS: clear to auscultation and percussion with normal breathing effort HEART: regular rate & rhythm and no murmurs and no lower extremity edema ABDOMEN:abdomen soft, (+) tenderness at RUQ, liver is palpable 10cm below rigcage, firm and tender, (+) ascites, normal bowel sounds Musculoskeletal:no cyanosis of digits and no clubbing  PSYCH: alert & oriented x 3 with fluent speech NEURO: no focal motor/sensory deficits EXTREMITIES:trace pitting edema on RLE  LABORATORY DATA:  I have reviewed the data as listed Lab Results  Component Value Date    WBC 18.3* 07/25/2014   HGB 8.0* 07/25/2014   HCT 27.1* 07/25/2014   MCV 75.9* 07/25/2014   PLT 332 07/25/2014    Recent Labs  07/21/14 1352 07/25/14 1643  NA 141 142  K 3.8 4.4  CL 101 104  CO2 24 24  GLUCOSE 96 96  BUN 11 23  CREATININE 0.67 0.99  CALCIUM 9.1 8.7  GFRNONAA >90  --   GFRAA >90  --   PROT 6.1  --   ALBUMIN 2.4*  --   AST 20  --   ALT 15  --   ALKPHOS 138*  --   BILITOT 0.6  --   HCG: 86.9  RADIOGRAPHIC STUDIES: I have personally reviewed the radiological images as listed and agreed with the findings in the report. US Abdomen Complete  07/21/2014   CLINICAL DATA:  Abdominal distention.  EXAM: ULTRASOUND ABDOMEN COMPLETE  COMPARISON:  None.  FINDINGS: Gallbladder: Gallbladder is not identified and may be contracted or surgically absent.  Common bile duct: Diameter: 3.7 mm, normal  Liver: Hepatic parenchymal echotexture is diffusely heterogeneous with nodular contour suggesting cirrhosis. Focal poorly defined hypoechoic nodules are demonstrated in the liver, measuring up to about 2.7 cm diameter. Appearance is worrisome for primary or metastatic neoplasm.  IVC: No abnormality visualized.  Pancreas: Not well visualized due to overlying bowel gas.  Spleen: Spleen is enlarged, measuring 16.7 cm length with volume calculated at 1022 mL.  Right Kidney: Length: 12.7 cm. Echogenicity within normal limits. No mass or hydronephrosis visualized.  Left Kidney: Length: 12.6 cm. Echogenicity within normal limits. No mass or hydronephrosis visualized.  Abdominal aorta: No aneurysm visualized.  Other findings: Small amount of free fluid in the upper abdomen consistent with ascites. Small bilateral pleural effusions noted.  IMPRESSION: Changes of hepatic cirrhosis with focal nodules in the liver worrisome for primary or metastatic disease. Splenic enlargement. Mild ascites. Small pleural effusions.   Electronically Signed   By: Lucienne Capers M.D.   On: 07/21/2014 23:03   US Ob Comp  Less 14 Wks  07/21/2014   CLINICAL DATA:  Abdominal distention. Estimated gestational age by LMP is 9 weeks 5 days. Quantitative beta HCG is 86. History of tubal ligations.  EXAM: OBSTETRIC <14 WK [place] AND TRANSVAGINAL OB US  TECHNIQUE: Both transabdominal and transvaginal ultrasound examinations were performed for complete evaluation of the gestation as well as the maternal uterus, adnexal regions, and pelvic cul-de-sac. Transvaginal technique was performed to assess early pregnancy.  COMPARISON:  None.  FINDINGS: Intrauterine gestational sac: No intrauterine gestational sac is identified.  Yolk sac:  Not identified.  Embryo:  Not identified.  Cardiac Activity: Not identified.  Heart Rate:   bpm  Maternal uterus/adnexae: There is a large amount of free fluid demonstrated in the pelvis with floating bowel loops. Fluid appears to be simple without significant debris. Uterus is anteverted. Heterogeneous myometrium with focal anterior fibroid measuring about 1.3 cm maximal diameter. Endometrium is not separately distinguished from the heterogeneous myometrium. Ovaries are not identified.  IMPRESSION: Large amount of free fluid in the pelvis. No intrauterine gestational sac, yolk sac, or fetal pole identified. Differential considerations include intrauterine pregnancy too early to be sonographically visualized, missed abortion, or ectopic pregnancy. Followup ultrasound is recommended in 10-14 days for further evaluation.   Electronically Signed   By: Lucienne Capers M.D.   On: 07/21/2014 22:43   US Ob Transvaginal  07/21/2014   CLINICAL DATA:  Abdominal distention. Estimated gestational age by LMP is 9 weeks 5 days. Quantitative beta HCG is 86. History of tubal ligations.  EXAM: OBSTETRIC <14 WK [place] AND TRANSVAGINAL OB US  TECHNIQUE: Both transabdominal and transvaginal ultrasound examinations were performed for complete evaluation of the gestation as well as the maternal uterus, adnexal regions, and pelvic  cul-de-sac. Transvaginal technique was performed to assess early pregnancy.  COMPARISON:  None.  FINDINGS: Intrauterine gestational sac: No intrauterine gestational sac is identified.  Yolk sac:  Not identified.  Embryo:  Not identified.  Cardiac Activity: Not identified.  Heart Rate:   bpm  Maternal uterus/adnexae: There is a large amount of free fluid demonstrated in the pelvis with floating bowel loops. Fluid appears to be simple without significant debris. Uterus is anteverted. Heterogeneous myometrium with focal anterior fibroid measuring about 1.3 cm maximal diameter. Endometrium is not separately distinguished from the heterogeneous myometrium. Ovaries are not identified.    IMPRESSION: Large amount of  free fluid in the pelvis. No intrauterine gestational sac, yolk sac, or fetal pole identified. Differential considerations include intrauterine pregnancy too early to be sonographically visualized, missed abortion, or ectopic pregnancy. Followup ultrasound is recommended in 10-14 days for further evaluation.    Dg Abd Acute W/chest  07/21/2014   CLINICAL DATA:  Abdominal pain, nausea and vomiting for 2 weeks  EXAM: ACUTE ABDOMEN SERIES (ABDOMEN 2 VIEW & CHEST 1 VIEW)  COMPARISON:  None.  FINDINGS: Mild enlargement of the cardiac silhouette is noted. Trace left pleural fluid is present. Diffusely prominent interstitial markings are noted without focal pulmonary opacity. No free air beneath the diaphragms.  Loops of bowel are mildly centrally displaced which may be seen with ascites or organomegaly. No dilated gas-filled loop of small or large bowel is identified. No differential air-fluid level. No acute osseous finding.    IMPRESSION: Diffusely prominent interstitial lung markings without focal pulmonary opacity.  Nonobstructing bowel gas pattern with central displacement of loops of bowel which may indicate ascites or organomegaly.       Mm Digital Screening Bilateral  07/14/2014   CLINICAL DATA:   Screening.  EXAM: DIGITAL SCREENING BILATERAL MAMMOGRAM WITH CAD  COMPARISON:  Previous exam(s).  ACR Breast Density Category c: The breast tissue is heterogeneously dense, which may obscure small masses.  FINDINGS: There are no findings suspicious for malignancy. Images were processed with CAD.  IMPRESSION: No mammographic evidence of malignancy. A result letter of this screening mammogram will be mailed directly to the patient.  RECOMMENDATION: Screening mammogram in one year. (Code:SM-B-01Y)  BI-RADS CATEGORY  1: Negative.   Electronically Signed   By: Lovey Newcomer M.D.   On: 07/14/2014 15:29    ASSESSMENT & PLAN:  53 yo female with PMH of bipolar but otherwise healthy, no history of liver disease or alcohol abuse, no history of blood transfusion, who presented with significant N/V, abdominal pain and bloating,  New discovered liver cirrhosis and masses, (+) ascites, and elevated serum beta HCG.   1. Liver cirrhosis and liver masses, HCC vs metastatic malignancy - I reviewed her vaginal and liver US results with her. Given the underline liver cirrhosis, primary [place] is possible. However given the elevated beta HCG, ovarian cancer particularly germ cell type with mets is also possible. Will obtain an abd and pelvic MRI and AFP for further evaluation. -she is likely need liver mass biopsy if liver MRI is not typical for [place]. Will request IR to do biopsy. Will also do a diagnostic and therapeutic paracentesis rule out SBP and malignant ascites.  -will discuss treatment options when diagnosis is made. Unfortunately, given her poor PS and quick deterioration, she chemo treatment option is very limited  2. Anemia, probably related to her underline malignancy.  -will obtain iron study, folic acid and J50 level, ret counts  3. Board line hypotension -will give 1 L NS in clinic today -encourage her to increase po intake at home  4. Malnutrition and deconditioning secondary to her probably underline  malignancy   -nutrition consult on next visit  I will see her back in 2 weeks, to finalize her diagnosis and treatment plan. She knows to call if she has any concerns before her appointment.    Orders Placed This Encounter  Procedures  . MR Abdomen W Wo Contrast    Standing Status: Future     Number of Occurrences:      Standing Expiration Date: 09/25/2015    Order Specific Question:  Reason for  Exam (SYMPTOM  OR DIAGNOSIS REQUIRED)    Answer:  liver US showed liver cirrhosis and liver masses, elevated HCG, rule out primary liver or metastatic cancer (including ovarian ca)    Order Specific Question:  Preferred imaging location?    Answer:  [facility]    Order Specific Question:  Does the patient have a pacemaker or implanted devices?    Answer:  No    Order Specific Question:  What is the patient's sedation requirement?    Answer:  No Sedation  . MR Pelvis W Wo Contrast    Standing Status: Future     Number of Occurrences:      Standing Expiration Date: 09/25/2015    Order Specific Question:  Reason for Exam (SYMPTOM  OR DIAGNOSIS REQUIRED)    Answer:  liver US showed liver cirrhosis and liver masses, elevated HCG, rule out primary liver or metastatic cancer (including ovarian ca)    Order Specific Question:  Preferred imaging location?    Answer:  [facility]    Order Specific Question:  Does the patient have a pacemaker or implanted devices?    Answer:  No    Order Specific Question:  What is the patient's sedation requirement?    Answer:  No Sedation  . [place] Core Biopsy    Standing Status: Future     Number of Occurrences:      Standing Expiration Date: 09/25/2015    Order Specific Question:  Reason for Exam (SYMPTOM  OR DIAGNOSIS REQUIRED)    Answer:  abnormal [place] of liver, which showed cirrhosis and masses, pending liver MRI, biopsy rule out primary or seconary liver melignancy    Order Specific Question:  Preferred imaging location?    Answer:  [facility]    Order Specific Question:  Call Report- Best Contact Number?    Answer:  office desk [phone]  . US Paracentesis    Standing Status: Future     Number of Occurrences:      Standing Expiration Date: 07/25/2015    Order Specific Question:  Is there a maximum amount of fluid to be removed?    Answer:  Yes    Order Specific Question:  What is the maximum amount of fluide to be removed?    Answer:  2057m    Order Specific Question:  Are Labs Needed?    Answer:  Yes    Order Specific Question:  Lab orders requested (DO NOT place separate lab orders, these will be automatically ordered during procedure specimen collection):    Answer:  Body fluid cell count with differential    Order Specific Question:  Lab orders requested (DO NOT place separate lab orders, these will be automatically ordered during procedure specimen collection):    Answer:  Albumin, Fluid    Order Specific Question:  Lab orders requested (DO NOT place separate lab orders, these will be automatically ordered during procedure specimen collection):    Answer:  Cytology - Non PAP    Order Specific Question:  Lab orders requested (DO NOT place separate lab orders, these will be automatically ordered during procedure specimen collection):    Answer:  Body fluid culture    Order Specific Question:  Lab orders requested (DO NOT place separate lab orders, these will be automatically ordered during procedure specimen collection):    Answer:  Glucose, Serous fluid    Order Specific Question:  Lab orders requested (DO NOT place separate lab orders, these will  be automatically ordered during procedure specimen collection):    Answer:  Protein, Body fluid    Order Specific Question:  Reason for Exam (SYMPTOM  OR DIAGNOSIS REQUIRED)    Answer:  rule out malignancy and infection, symptom relieve    Order Specific Question:  Preferred imaging location?    Answer:  [facility]    Order Specific Question:  Call Report-  Best Contact Number?    Answer:  [phone]  . CBC with Differential    Standing Status: Future     Number of Occurrences: 1     Standing Expiration Date: 07/25/2015  . Basic metabolic panel (Bmet) - [facility]    Standing Status: Future     Number of Occurrences: 1     Standing Expiration Date: 07/25/2015  . Ferritin    Standing Status: Future     Number of Occurrences:      Standing Expiration Date: 07/25/2015  . Iron and TIBC [facility]    Standing Status: Future     Number of Occurrences:      Standing Expiration Date: 07/25/2015  . Folate RBC    Standing Status: Future     Number of Occurrences:      Standing Expiration Date: 07/25/2015  . Vitamin B12    Standing Status: Future     Number of Occurrences:      Standing Expiration Date: 07/25/2015  . Reticulocyte Count    Standing Status: Future     Number of Occurrences:      Standing Expiration Date: 07/25/2015  . AFP tumor marker    Standing Status: Future     Number of Occurrences:      Standing Expiration Date: 07/25/2015  . CA 125    Standing Status: Future     Number of Occurrences:      Standing Expiration Date: 07/25/2015  . APTT    Standing Status: Future     Number of Occurrences:      Standing Expiration Date: 07/25/2015  . Protime-INR    Standing Status: Future     Number of Occurrences:      Standing Expiration Date: 07/25/2015    All questions were answered. The patient knows to call the clinic with any problems, questions or concerns. I spent 40 minutes counseling the patient face to face. The total time spent in the appointment was 60 minutes and more than 50% was on counseling.     [name], MD 07/25/2014 10:52 PM

## 2014-07-25 NOTE — Patient Instructions (Signed)
Dehydration, Adult Dehydration is when you lose more fluids from the body than you take in. Vital organs like the kidneys, brain, and heart cannot function without a proper amount of fluids and salt. Any loss of fluids from the body can cause dehydration.  CAUSES   Vomiting.  Diarrhea.  Excessive sweating.  Excessive urine output.  Fever. SYMPTOMS  Mild dehydration  Thirst.  Dry lips.  Slightly dry mouth. Moderate dehydration  Very dry mouth.  Sunken eyes.  Skin does not bounce back quickly when lightly pinched and released.  Dark urine and decreased urine production.  Decreased tear production.  Headache. Severe dehydration  Very dry mouth.  Extreme thirst.  Rapid, weak pulse (more than 100 beats per minute at rest).  Cold hands and feet.  Not able to sweat in spite of heat and temperature.  Rapid breathing.  Blue lips.  Confusion and lethargy.  Difficulty being awakened.  Minimal urine production.  No tears. DIAGNOSIS  Your caregiver will diagnose dehydration based on your symptoms and your exam. Blood and urine tests will help confirm the diagnosis. The diagnostic evaluation should also identify the cause of dehydration. TREATMENT  Treatment of mild or moderate dehydration can often be done at home by increasing the amount of fluids that you drink. It is best to drink small amounts of fluid more often. Drinking too much at one time can make vomiting worse. Refer to the home care instructions below. Severe dehydration needs to be treated at the hospital where you will probably be given intravenous (IV) fluids that contain water and electrolytes. HOME CARE INSTRUCTIONS   Ask your caregiver about specific rehydration instructions.  Drink enough fluids to keep your urine clear or pale yellow.  Drink small amounts frequently if you have nausea and vomiting.  Eat as you normally do.  Avoid:  Foods or drinks high in sugar.  Carbonated  drinks.  Juice.  Extremely hot or cold fluids.  Drinks with caffeine.  Fatty, greasy foods.  Alcohol.  Tobacco.  Overeating.  Gelatin desserts.  Wash your hands well to avoid spreading bacteria and viruses.  Only take over-the-counter or prescription medicines for pain, discomfort, or fever as directed by your caregiver.  Ask your caregiver if you should continue all prescribed and over-the-counter medicines.  Keep all follow-up appointments with your caregiver. SEEK MEDICAL CARE IF:  You have abdominal pain and it increases or stays in one area (localizes).  You have a rash, stiff neck, or severe headache.  You are irritable, sleepy, or difficult to awaken.  You are weak, dizzy, or extremely thirsty. SEEK IMMEDIATE MEDICAL CARE IF:   You are unable to keep fluids down or you get worse despite treatment.  You have frequent episodes of vomiting or diarrhea.  You have blood or green matter (bile) in your vomit.  You have blood in your stool or your stool looks black and tarry.  You have not urinated in 6 to 8 hours, or you have only urinated a small amount of very dark urine.  You have a fever.  You faint. MAKE SURE YOU:   Understand these instructions.  Will watch your condition.  Will get help right away if you are not doing well or get worse. Document Released: 09/01/2005 Document Revised: 11/24/2011 Document Reviewed: 04/21/2011 ExitCare Patient Information 2015 [employer]. This information is not intended to replace advice given to you by your health care provider. Make sure you discuss any questions you have with your health care   provider.  

## 2014-07-25 NOTE — Telephone Encounter (Signed)
per Amy add ivf done....pt here

## 2014-07-25 NOTE — Addendum Note (Signed)
Addended by: [name] on: 07/25/2014 11:35 PM   Modules accepted: Orders

## 2014-07-25 NOTE — Progress Notes (Signed)
1745: Dr. Burr Medico at chairside to see patient, okay to discharge when IVF finished.   1800: Pt discharged with husband and daughter via wheelchair. Pt denies any dizziness upon standing, states she feels okay. Vital signs stable.

## 2014-07-26 ENCOUNTER — Telehealth: Payer: Self-pay | Admitting: Hematology

## 2014-07-26 ENCOUNTER — Ambulatory Visit (HOSPITAL_BASED_OUTPATIENT_CLINIC_OR_DEPARTMENT_OTHER): Payer: Medicare HMO

## 2014-07-26 ENCOUNTER — Other Ambulatory Visit: Payer: Self-pay | Admitting: *Deleted

## 2014-07-26 DIAGNOSIS — D649 Anemia, unspecified: Secondary | ICD-10-CM

## 2014-07-26 DIAGNOSIS — R188 Other ascites: Secondary | ICD-10-CM

## 2014-07-26 DIAGNOSIS — R160 Hepatomegaly, not elsewhere classified: Secondary | ICD-10-CM

## 2014-07-26 DIAGNOSIS — K5900 Constipation, unspecified: Secondary | ICD-10-CM

## 2014-07-26 DIAGNOSIS — K7460 Unspecified cirrhosis of liver: Secondary | ICD-10-CM

## 2014-07-26 LAB — IRON AND TIBC CHCC: TIBC: 175 ug/dL — AB (ref 236–444)

## 2014-07-26 LAB — FERRITIN CHCC: FERRITIN: 551 ng/mL — AB (ref 9–269)

## 2014-07-26 MED ORDER — LACTULOSE 10 GM/15ML PO SOLN: ORAL | Status: DC

## 2014-07-26 MED ORDER — OXYCODONE HCL 5 MG PO TABS: 5.0000 mg | ORAL_TABLET | ORAL | Status: DC | PRN

## 2014-07-27 ENCOUNTER — Telehealth: Payer: Self-pay | Admitting: *Deleted

## 2014-07-27 ENCOUNTER — Ambulatory Visit (HOSPITAL_COMMUNITY)
Admission: RE | Admit: 2014-07-27 | Discharge: 2014-07-27 | Disposition: A | Discharge status: Home / Self Care | Payer: Medicare HMO | Source: Ambulatory Visit | Attending: Hematology | Admitting: Hematology

## 2014-07-27 ENCOUNTER — Other Ambulatory Visit: Payer: Self-pay | Admitting: Hematology

## 2014-07-27 DIAGNOSIS — R188 Other ascites: Principal | ICD-10-CM

## 2014-07-27 DIAGNOSIS — K7460 Unspecified cirrhosis of liver: Secondary | ICD-10-CM

## 2014-07-27 DIAGNOSIS — R160 Hepatomegaly, not elsewhere classified: Secondary | ICD-10-CM

## 2014-07-27 DIAGNOSIS — D649 Anemia, unspecified: Secondary | ICD-10-CM

## 2014-07-27 DIAGNOSIS — R140 Abdominal distension (gaseous): Secondary | ICD-10-CM | POA: Diagnosis present

## 2014-07-27 LAB — CA 125: CA 125: 843 U/mL — ABNORMAL HIGH (ref <35)

## 2014-07-27 LAB — VITAMIN B12: Vitamin B-12: 1566 pg/mL — ABNORMAL HIGH (ref 211–911)

## 2014-07-27 LAB — AFP TUMOR MARKER: AFP-Tumor Marker: 1.9 ng/mL (ref <6.1)

## 2014-07-27 NOTE — Telephone Encounter (Signed)
Per Dr. Burr Medico, pt needs MRA Abdomen for now prior to liver biopsy.  Benedetto Goad in pre-cert stated pt's insurance had approved for the MRA Abdomen.   Vaughan Basta stated she would inform radiology; and radiology will contact pt.

## 2014-07-28 ENCOUNTER — Telehealth: Payer: Self-pay | Admitting: *Deleted

## 2014-07-28 ENCOUNTER — Other Ambulatory Visit: Payer: Self-pay | Admitting: *Deleted

## 2014-07-28 ENCOUNTER — Ambulatory Visit (HOSPITAL_COMMUNITY)
Admission: RE | Admit: 2014-07-28 | Discharge: 2014-07-28 | Disposition: A | Discharge status: Home / Self Care | Payer: Medicare HMO | Source: Ambulatory Visit | Attending: Hematology | Admitting: Hematology

## 2014-07-28 DIAGNOSIS — R188 Other ascites: Secondary | ICD-10-CM

## 2014-07-28 DIAGNOSIS — C787 Secondary malignant neoplasm of liver and intrahepatic bile duct: Secondary | ICD-10-CM | POA: Insufficient documentation

## 2014-07-28 DIAGNOSIS — K7460 Unspecified cirrhosis of liver: Secondary | ICD-10-CM

## 2014-07-28 DIAGNOSIS — D649 Anemia, unspecified: Secondary | ICD-10-CM

## 2014-07-28 DIAGNOSIS — R109 Unspecified abdominal pain: Secondary | ICD-10-CM | POA: Diagnosis present

## 2014-07-28 DIAGNOSIS — R160 Hepatomegaly, not elsewhere classified: Secondary | ICD-10-CM

## 2014-07-28 DIAGNOSIS — R162 Hepatomegaly with splenomegaly, not elsewhere classified: Secondary | ICD-10-CM | POA: Insufficient documentation

## 2014-07-28 DIAGNOSIS — R599 Enlarged lymph nodes, unspecified: Secondary | ICD-10-CM | POA: Insufficient documentation

## 2014-07-28 LAB — HCG, SERUM, QUALITATIVE: PREG SERUM: NEGATIVE

## 2014-07-28 MED ORDER — GADOXETATE DISODIUM 0.25 MMOL/ML IV SOLN
5.0000 mL | Freq: Once | INTRAVENOUS | Status: AC | PRN
  Administered 2014-07-28: 5 mL via INTRAVENOUS

## 2014-07-28 NOTE — Telephone Encounter (Signed)
Called pt to discuss getting another pregnancy test per Radiology[facility].  Radiologist discussed with Dr Burr Medico that he would like to repeat this.  Dr. Burr Medico would like to have MRI despite pregnancy test but wanted this nurse to check with pt to make sure she wanted to proceed with MRI despite small risk if she were pregnant.  Spoke to pt's husband & he reports that they want to proceed with MRI & states that pt had tube "tied & fried" years ago.  Pt instructed to go to [facility] radiology for lab @ 12:30 pm before MRI appt @ 2pm.  Called lab at [facility] & order faxed to [phone] for quantitative serum pregnancy test signed by Dr Burr Medico.

## 2014-07-28 NOTE — Telephone Encounter (Signed)
PT. HAS TAKEN 135ML OVER THE PAST 2-1/2 DAYS. SHE IS PASSING GAS BUT NO BOWEL MOVEMENT. THIS NOTE TO DR.FENG.

## 2014-07-31 ENCOUNTER — Other Ambulatory Visit: Payer: Self-pay | Admitting: *Deleted

## 2014-07-31 ENCOUNTER — Telehealth: Payer: Self-pay | Admitting: *Deleted

## 2014-07-31 ENCOUNTER — Telehealth: Payer: Self-pay | Admitting: Hematology

## 2014-07-31 ENCOUNTER — Other Ambulatory Visit: Payer: Self-pay | Admitting: Hematology

## 2014-07-31 DIAGNOSIS — R160 Hepatomegaly, not elsewhere classified: Secondary | ICD-10-CM

## 2014-07-31 NOTE — Telephone Encounter (Signed)
Received call from daughter,[name] stating that she called on fri & her mother was changed from lactulose to ducolax.  She states that her mother has taken 6 pills - 3 fri eve & 3 sat eve & hasn't taken any more b/c it isn't working.  She hasn't had a BM in 3 weeks & 2 days.  She was passing gas with the lactulose & not sure if she is now.  She is having upper R abd pain which comes & goes.  She would also like the results of her MRI done fri & see what Dr Morey Hummingbird thinks about her u/s done on thurs-She states she was told that there wasn't enough fluid to take off.  Note to Dr Morey Hummingbird.

## 2014-07-31 NOTE — Telephone Encounter (Signed)
Discussed using Fleets enema per Dr. Burr Medico with Pt's daughter & continuing the ducolax bid since she is on pain meds. Dr Burr Medico spoke with her & suggested she restart lactulose 30 ml q 3-4 hrs with max of 300 ml/24 hr.  Informed daughter that per Dr Burr Medico, we will cancel MRI of pelvis/chest tomorrow at [facility] since it is an expensive test & she may order CT of chest. [name] has many questions & concerns about her mother & understandably so.  Informed that we are trying to get the core bx scheduled & waiting on radiologist review for procedure & will get back with her as soon as we know this can be done.

## 2014-07-31 NOTE — Telephone Encounter (Signed)
added lb/fu for 11/17 per desk nurse and 11/16 pof. per pof from provider no need to call pt dtr aware of appt d/t. doppler will be schedule tomorrow AM when dept opens - desk nurse aware.

## 2014-08-01 ENCOUNTER — Encounter: Payer: Self-pay | Admitting: Hematology

## 2014-08-01 ENCOUNTER — Ambulatory Visit (HOSPITAL_BASED_OUTPATIENT_CLINIC_OR_DEPARTMENT_OTHER): Payer: Medicare HMO | Admitting: Hematology

## 2014-08-01 ENCOUNTER — Other Ambulatory Visit: Payer: Self-pay | Admitting: *Deleted

## 2014-08-01 ENCOUNTER — Other Ambulatory Visit (HOSPITAL_BASED_OUTPATIENT_CLINIC_OR_DEPARTMENT_OTHER): Payer: Medicare HMO

## 2014-08-01 ENCOUNTER — Ambulatory Visit (HOSPITAL_COMMUNITY)
Admission: RE | Admit: 2014-08-01 | Discharge: 2014-08-01 | Disposition: A | Discharge status: Home / Self Care | Payer: Medicare HMO | Source: Ambulatory Visit | Attending: Hematology | Admitting: Hematology

## 2014-08-01 ENCOUNTER — Ambulatory Visit (HOSPITAL_COMMUNITY): Payer: Medicare HMO

## 2014-08-01 ENCOUNTER — Ambulatory Visit (HOSPITAL_COMMUNITY): Admission: RE | Admit: 2014-08-01 | Payer: Medicare HMO | Source: Ambulatory Visit

## 2014-08-01 ENCOUNTER — Telehealth: Payer: Self-pay | Admitting: Hematology

## 2014-08-01 ENCOUNTER — Ambulatory Visit: Payer: Self-pay | Admitting: Hematology and Oncology

## 2014-08-01 ENCOUNTER — Ambulatory Visit: Payer: Self-pay

## 2014-08-01 VITALS — BP 123/83 | HR 88 | Temp 97.5°F | Resp 18 | Ht 67.0 in | Wt 130.2 lb

## 2014-08-01 DIAGNOSIS — E46 Unspecified protein-calorie malnutrition: Secondary | ICD-10-CM

## 2014-08-01 DIAGNOSIS — K7460 Unspecified cirrhosis of liver: Secondary | ICD-10-CM

## 2014-08-01 DIAGNOSIS — C229 Malignant neoplasm of liver, not specified as primary or secondary: Secondary | ICD-10-CM | POA: Diagnosis present

## 2014-08-01 DIAGNOSIS — R188 Other ascites: Secondary | ICD-10-CM

## 2014-08-01 DIAGNOSIS — R160 Hepatomegaly, not elsewhere classified: Secondary | ICD-10-CM

## 2014-08-01 DIAGNOSIS — R932 Abnormal findings on diagnostic imaging of liver and biliary tract: Secondary | ICD-10-CM

## 2014-08-01 DIAGNOSIS — R112 Nausea with vomiting, unspecified: Secondary | ICD-10-CM

## 2014-08-01 DIAGNOSIS — D649 Anemia, unspecified: Secondary | ICD-10-CM

## 2014-08-01 DIAGNOSIS — R609 Edema, unspecified: Secondary | ICD-10-CM

## 2014-08-01 DIAGNOSIS — M7989 Other specified soft tissue disorders: Secondary | ICD-10-CM | POA: Diagnosis not present

## 2014-08-01 LAB — CBC & DIFF AND RETIC
BASO%: 0.5 % (ref 0.0–2.0)
BASOS ABS: 0.1 10*3/uL (ref 0.0–0.1)
EOS ABS: 0 10*3/uL (ref 0.0–0.5)
EOS%: 0.1 % (ref 0.0–7.0)
HEMATOCRIT: 28.9 % — AB (ref 34.8–46.6)
HEMOGLOBIN: 8.2 g/dL — AB (ref 11.6–15.9)
Immature Retic Fract: 21 % — ABNORMAL HIGH (ref 1.60–10.00)
LYMPH%: 7.1 % — ABNORMAL LOW (ref 14.0–49.7)
MCH: 20.8 pg — ABNORMAL LOW (ref 25.1–34.0)
MCHC: 28.5 g/dL — ABNORMAL LOW (ref 31.5–36.0)
MCV: 73.1 fL — AB (ref 79.5–101.0)
MONO#: 1.9 10*3/uL — ABNORMAL HIGH (ref 0.1–0.9)
MONO%: 8.3 % (ref 0.0–14.0)
NEUT%: 84 % — ABNORMAL HIGH (ref 38.4–76.8)
NEUTROS ABS: 19.5 10*3/uL — AB (ref 1.5–6.5)
Platelets: 421 10*3/uL — ABNORMAL HIGH (ref 145–400)
RBC: 3.96 10*6/uL (ref 3.70–5.45)
RDW: 18.8 % — AB (ref 11.2–14.5)
RETIC CT ABS: 131.08 10*3/uL — AB (ref 33.70–90.70)
Retic %: 3.31 % — ABNORMAL HIGH (ref 0.70–2.10)
WBC: 23.2 10*3/uL — ABNORMAL HIGH (ref 3.9–10.3)
lymph#: 1.7 10*3/uL (ref 0.9–3.3)

## 2014-08-01 LAB — COMPREHENSIVE METABOLIC PANEL (CC13)
ALBUMIN: 2.3 g/dL — AB (ref 3.5–5.0)
ALT: 7 U/L (ref 0–55)
ANION GAP: 11 meq/L (ref 3–11)
AST: 13 U/L (ref 5–34)
Alkaline Phosphatase: 190 U/L — ABNORMAL HIGH (ref 40–150)
BUN: 11.1 mg/dL (ref 7.0–26.0)
CO2: 26 mEq/L (ref 22–29)
Calcium: 8.9 mg/dL (ref 8.4–10.4)
Chloride: 105 mEq/L (ref 98–109)
Creatinine: 0.7 mg/dL (ref 0.6–1.1)
GLUCOSE: 90 mg/dL (ref 70–140)
POTASSIUM: 4.2 meq/L (ref 3.5–5.1)
Sodium: 141 mEq/L (ref 136–145)
Total Bilirubin: 0.84 mg/dL (ref 0.20–1.20)
Total Protein: 5.9 g/dL — ABNORMAL LOW (ref 6.4–8.3)

## 2014-08-01 LAB — PROTHROMBIN TIME
INR: 1.14 (ref <1.50)
Prothrombin Time: 14.7 seconds (ref 11.6–15.2)

## 2014-08-01 LAB — TECHNOLOGIST REVIEW

## 2014-08-01 LAB — APTT: aPTT: 30.8 seconds (ref 24–37)

## 2014-08-01 MED ORDER — ONDANSETRON HCL 4 MG PO TABS: 4.0000 mg | ORAL_TABLET | Freq: Four times a day (QID) | ORAL | Status: DC | PRN

## 2014-08-01 NOTE — Telephone Encounter (Signed)
Script given to pt for compression stockings with refill for swollen legs to take to medical supply per Dr Burr Medico.

## 2014-08-01 NOTE — Progress Notes (Signed)
Bilateral lower extremity venous duplex completed:  No evidence of DVT, superficial thrombosis, or Baker's cyst.   

## 2014-08-01 NOTE — Progress Notes (Signed)
[facility] OFFICE PROGRESS NOTE  Patient Care Team: Lorene Dy, MD as PCP - General (Internal Medicine)  SUMMARY OF ONCOLOGIC HISTORY:  Multiple liver lesions, probable metastatic cancer    OTHER MEDICAL PROBLEMS: 1. Bipolar 2. Hypothyroidism   INTERVAL HISTORY: She returns for follow up. She has been drinking Ensure 2cans a day, and eating small amount of food. She is to feel quite fatigued. She has tried lactulose and Dulcolax in the past week without any success. Her last bowel movement was about 3 weeks ago. She is scheduled to have a colonoscopy in 3 days. No new other complaints.  REVIEW OF SYSTEMS:   Constitutional: Denies fevers, chills, (+) weight loss and very fatigued  Eyes: Denies blurriness of vision Ears, nose, mouth, throat, and face: Denies mucositis or sore throat Respiratory: Denies cough, dyspnea or wheezes Cardiovascular: Denies palpitation, chest discomfort or lower extremity swelling Gastrointestinal:  (+) nausea better with Zofran, no heartburn or change in bowel habits Skin: Denies abnormal skin rashes. (+) b/l leg swelling  Lymphatics: Denies new lymphadenopathy or easy bruising Neurological:Denies numbness, tingling or new weaknesses Behavioral/Psych: Mood is stable, no new changes  All other systems were reviewed with the patient and are negative.  I have reviewed the past medical history, past surgical history, social history and family history with the patient and they are unchanged from previous note.  ALLERGIES:  is allergic to buspar; cymbalta; hydroxyzine; and sertraline.  MEDICATIONS:  Current Outpatient Prescriptions  Medication Sig Dispense Refill  . acetaminophen-codeine (TYLENOL #3) 300-30 MG per tablet Take 1 tablet by mouth as needed (pain).     . benztropine (COGENTIN) 1 MG tablet Take 1 tablet (1 mg total) by mouth daily. (Patient taking differently: Take 1 mg by mouth at bedtime. ) 90 tablet 2  . buPROPion (WELLBUTRIN  XL) 300 MG 24 hr tablet Take 1 tablet (300 mg total) by mouth daily. 90 tablet 2  . butalbital-acetaminophen-caffeine (ESGIC PLUS) 50-500-40 MG per tablet Take 1 tablet by mouth as needed for headache.     . lactulose (CHRONULAC) 10 GM/15ML solution 15 ml q 4 hr as needed for constipation.  Max of 60 ml per day (Patient taking differently: 10 g. 30 ml q 4 hr as needed for constipation.  Max of 60 ml per day) 500 mL 2  . levothyroxine (SYNTHROID, LEVOTHROID) 88 MCG tablet Take 88 mcg by mouth at bedtime.     . ondansetron (ZOFRAN) 4 MG tablet Take 1 tablet (4 mg total) by mouth every 6 (six) hours as needed for nausea or vomiting. 60 tablet 4  . oxyCODONE (OXY IR/ROXICODONE) 5 MG immediate release tablet Take 1-2 tablets (5-10 mg total) by mouth every 4 (four) hours as needed for severe pain. 60 tablet 0  . oxyCODONE-acetaminophen (PERCOCET) 5-325 MG per tablet Take 1-2 tablets by mouth every 4 (four) hours as needed. 20 tablet 0  . propranolol (INDERAL) 10 MG tablet Take 1 tablet (10 mg total) by mouth 3 (three) times daily. 270 tablet 2  . risperiDONE (RISPERDAL) 3 MG tablet Take 1 tablet (3 mg total) by mouth at bedtime. 90 tablet 2  . zolpidem (AMBIEN) 10 MG tablet Take 1 tablet (10 mg total) by mouth at bedtime as needed for sleep. 90 tablet 1   No current facility-administered medications for this visit.    PHYSICAL EXAMINATION: ECOG PERFORMANCE STATUS: 3 - Symptomatic, >50% confined to bed  Filed Vitals:   08/01/14 1407  BP: 123/83  Pulse:  Temp:   Resp:    Filed Weights   08/01/14 1403  Weight: 130 lb 3.2 oz (59.058 kg)    GENERAL:alert, no distress and comfortable SKIN: skin color, texture, turgor are normal, no rashes or significant lesions EYES: normal, Conjunctiva are pink and non-injected, sclera clear OROPHARYNX:no exudate, no erythema and lips, buccal mucosa, and tongue normal  NECK: supple, thyroid normal size, non-tender, without nodularity LYMPH:  no palpable  lymphadenopathy in the cervical, axillary or inguinal LUNGS: clear to auscultation and percussion with normal breathing effort HEART: regular rate & rhythm and no murmurs and no lower extremity edema ABDOMEN:abdomen soft, non-tender and normal bowel sounds Musculoskeletal:no cyanosis of digits and no clubbing  NEURO: alert & oriented x 3 with fluent speech, no focal motor/sensory deficits  LABORATORY DATA:  I have reviewed the data as listed    Component Value Date/Time   NA 141 08/01/2014 1330   NA 142 07/25/2014 1643   K 4.2 08/01/2014 1330   K 4.4 07/25/2014 1643   CL 104 07/25/2014 1643   CO2 26 08/01/2014 1330   CO2 24 07/25/2014 1643   GLUCOSE 90 08/01/2014 1330   GLUCOSE 96 07/25/2014 1643   BUN 11.1 08/01/2014 1330   BUN 23 07/25/2014 1643   CREATININE 0.7 08/01/2014 1330   CREATININE 0.99 07/25/2014 1643   CREATININE 0.84 12/18/2011 1012   CALCIUM 8.9 08/01/2014 1330   CALCIUM 8.7 07/25/2014 1643   PROT 5.9* 08/01/2014 1330   PROT 6.1 07/21/2014 1352   ALBUMIN 2.3* 08/01/2014 1330   ALBUMIN 2.4* 07/21/2014 1352   AST 13 08/01/2014 1330   AST 20 07/21/2014 1352   ALT 7 08/01/2014 1330   ALT 15 07/21/2014 1352   ALKPHOS 190* 08/01/2014 1330   ALKPHOS 138* 07/21/2014 1352   BILITOT 0.84 08/01/2014 1330   BILITOT 0.6 07/21/2014 1352   GFRNONAA >90 07/21/2014 1352   GFRAA >90 07/21/2014 1352    No results found for: SPEP, UPEP  Lab Results  Component Value Date   WBC 23.2* 08/01/2014   NEUTROABS 19.5* 08/01/2014   HGB 8.2* 08/01/2014   HCT 28.9* 08/01/2014   MCV 73.1* 08/01/2014   PLT 421* 08/01/2014      Chemistry      Component Value Date/Time   NA 141 08/01/2014 1330   NA 142 07/25/2014 1643   K 4.2 08/01/2014 1330   K 4.4 07/25/2014 1643   CL 104 07/25/2014 1643   CO2 26 08/01/2014 1330   CO2 24 07/25/2014 1643   BUN 11.1 08/01/2014 1330   BUN 23 07/25/2014 1643   CREATININE 0.7 08/01/2014 1330   CREATININE 0.99 07/25/2014 1643   CREATININE  0.84 12/18/2011 1012      Component Value Date/Time   CALCIUM 8.9 08/01/2014 1330   CALCIUM 8.7 07/25/2014 1643   ALKPHOS 190* 08/01/2014 1330   ALKPHOS 138* 07/21/2014 1352   AST 13 08/01/2014 1330   AST 20 07/21/2014 1352   ALT 7 08/01/2014 1330   ALT 15 07/21/2014 1352   BILITOT 0.84 08/01/2014 1330   BILITOT 0.6 07/21/2014 1352       RADIOGRAPHIC STUDIES: I have personally reviewed the radiological images as listed and agreed with the findings in the report. No results found.   ASSESSMENT & PLAN:  53 yo female with PMH of bipolar but otherwise healthy, no history of liver disease or alcohol abuse, no history of blood transfusion, who presented with significant N/V, abdominal pain and bloating, New discovered multiple  liver masses and small ascites, and elevated serum beta HCG.   1. 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 The multiple liver masses with the largest one in right lobe segment 8 measuring about 5 cm. No significant for liver cirrhosis on the image. This makes [place] much less likely. -I have scheduled her for ultrasound-guided liver biopsy in 2 days by IR.  -I would like also obtain a CT chest without contrast to rule out metastasis or primary lung cancer. -will discuss treatment options when diagnosis is made. Unfortunately, given her poor PS and quick deterioration, she chemo treatment option is very limited  2. Anemia, probably related to her underline malignancy.  -the iron studies showed very low serum iron level, low TIBC and elevated ferritin at 551. This is consistent with anemia of chronic disease most likely related to her underlying malignancy. Her B12 level was elevated. No evidence of bleeding. -Her hemoglobin is moderately low but stable. She not very somatic. We discussed about transfusion if her hemoglobin drops further.   3.  Bilateral leg edema -I checked Doppler ultrasound on her legs which showed no evidence of DVT. -this is likely related to her hypoalbuminemia, and the poor venous return. I encouraged her to elevate her legs and the use compression stocks. I would not use Lasix due to her poor by mouth intake.  4. Elevate beta hCG -she denies any recent sexual activity. This unlikely a pregnancy, and elevated HCg could be related to her underlying malignancy, also atopical pregnancy is not completely ruled out. -Repeated pregnancy test was negative last Friday. I will obtain another quantitative hCG today. -Refer her to OB/GYN Dr. Laurance Flatten to follow-up.  5. Malnutrition and deconditioning secondary to her probably underline malignancy  -nutrition consult on next visit  I will see her back next week, to finalize her diagnosis and treatment plan. She knows to call if she has any concerns before her appointment.      Orders Placed This Encounter  Procedures  . Compression stockings  . CT Chest Wo Contrast    Standing Status: Future     Number of Occurrences:      Standing Expiration Date: 08/01/2015    Order Specific Question:  Reason for Exam (SYMPTOM  OR DIAGNOSIS REQUIRED)    Answer:  metastatic liver lesions, unknow primary    Order Specific Question:  Is the patient pregnant?    Answer:  No     Comments:  unlikely     Order Specific Question:  Preferred imaging location?    Answer:  [facility]  . HCG, serum quantitative    Standing Status: Future     Number of Occurrences: 1     Standing Expiration Date: 08/01/2015   All questions were answered. The patient knows to call the clinic with any problems, questions or concerns. No barriers to learning was detected. I spent 20 minutes counseling the patient face to face. The total time spent in the appointment was 30 minutes and more than 50% was on counseling and review of test results     [name], MD 08/01/2014 3:43 PM

## 2014-08-01 NOTE — Telephone Encounter (Signed)
Confirm appt for 08/01/14 at [facility].

## 2014-08-02 ENCOUNTER — Telehealth: Payer: Self-pay | Admitting: Hematology

## 2014-08-02 ENCOUNTER — Other Ambulatory Visit: Payer: Self-pay | Admitting: Radiology

## 2014-08-02 LAB — HEPATITIS B SURFACE ANTIGEN: Hepatitis B Surface Ag: NEGATIVE

## 2014-08-02 LAB — HCG, QUANTITATIVE, PREGNANCY: HCG, BETA CHAIN, QUANT, S: 26.1 m[IU]/mL

## 2014-08-02 LAB — HEPATITIS B SURFACE ANTIBODY,QUALITATIVE: Hep B S Ab: NEGATIVE

## 2014-08-02 LAB — CEA: CEA: 23.2 ng/mL — ABNORMAL HIGH (ref 0.0–5.0)

## 2014-08-02 LAB — HEPATITIS C ANTIBODY: HCV Ab: NEGATIVE

## 2014-08-02 LAB — FOLATE RBC: RBC Folate: 1204 ng/mL (ref >280)

## 2014-08-02 LAB — HEPATITIS B CORE ANTIBODY, TOTAL: Hep B Core Total Ab: NONREACTIVE

## 2014-08-02 NOTE — Telephone Encounter (Signed)
Spoke with daughter and she will call to sch OBGYN appt. Faxed referral to [facility] office.

## 2014-08-03 ENCOUNTER — Telehealth: Payer: Self-pay | Admitting: *Deleted

## 2014-08-03 ENCOUNTER — Telehealth: Payer: Self-pay

## 2014-08-03 ENCOUNTER — Other Ambulatory Visit (HOSPITAL_COMMUNITY): Payer: Medicare HMO

## 2014-08-03 ENCOUNTER — Ambulatory Visit (HOSPITAL_COMMUNITY)
Admission: RE | Admit: 2014-08-03 | Discharge: 2014-08-03 | Disposition: A | Discharge status: Home / Self Care | Payer: Medicare HMO | Source: Ambulatory Visit | Attending: Hematology | Admitting: Hematology

## 2014-08-03 ENCOUNTER — Other Ambulatory Visit: Payer: Self-pay | Admitting: Hematology

## 2014-08-03 ENCOUNTER — Encounter (HOSPITAL_COMMUNITY): Payer: Self-pay

## 2014-08-03 ENCOUNTER — Ambulatory Visit: Payer: Self-pay | Admitting: Obstetrics & Gynecology

## 2014-08-03 DIAGNOSIS — R188 Other ascites: Principal | ICD-10-CM

## 2014-08-03 DIAGNOSIS — R971 Elevated cancer antigen 125 [CA 125]: Secondary | ICD-10-CM | POA: Diagnosis present

## 2014-08-03 DIAGNOSIS — R110 Nausea: Secondary | ICD-10-CM | POA: Diagnosis not present

## 2014-08-03 DIAGNOSIS — R5383 Other fatigue: Secondary | ICD-10-CM | POA: Insufficient documentation

## 2014-08-03 DIAGNOSIS — D649 Anemia, unspecified: Secondary | ICD-10-CM

## 2014-08-03 DIAGNOSIS — C787 Secondary malignant neoplasm of liver and intrahepatic bile duct: Secondary | ICD-10-CM | POA: Insufficient documentation

## 2014-08-03 DIAGNOSIS — R599 Enlarged lymph nodes, unspecified: Secondary | ICD-10-CM | POA: Insufficient documentation

## 2014-08-03 DIAGNOSIS — R51 Headache: Secondary | ICD-10-CM | POA: Diagnosis not present

## 2014-08-03 DIAGNOSIS — Z87891 Personal history of nicotine dependence: Secondary | ICD-10-CM | POA: Diagnosis not present

## 2014-08-03 DIAGNOSIS — K7460 Unspecified cirrhosis of liver: Secondary | ICD-10-CM

## 2014-08-03 DIAGNOSIS — R162 Hepatomegaly with splenomegaly, not elsewhere classified: Secondary | ICD-10-CM | POA: Diagnosis not present

## 2014-08-03 DIAGNOSIS — R160 Hepatomegaly, not elsewhere classified: Secondary | ICD-10-CM

## 2014-08-03 DIAGNOSIS — R601 Generalized edema: Secondary | ICD-10-CM | POA: Insufficient documentation

## 2014-08-03 DIAGNOSIS — R109 Unspecified abdominal pain: Secondary | ICD-10-CM | POA: Diagnosis present

## 2014-08-03 DIAGNOSIS — K5900 Constipation, unspecified: Secondary | ICD-10-CM | POA: Diagnosis not present

## 2014-08-03 DIAGNOSIS — R630 Anorexia: Secondary | ICD-10-CM | POA: Diagnosis not present

## 2014-08-03 DIAGNOSIS — R970 Elevated carcinoembryonic antigen [CEA]: Secondary | ICD-10-CM | POA: Diagnosis present

## 2014-08-03 DIAGNOSIS — F319 Bipolar disorder, unspecified: Secondary | ICD-10-CM | POA: Diagnosis not present

## 2014-08-03 DIAGNOSIS — Z79899 Other long term (current) drug therapy: Secondary | ICD-10-CM | POA: Diagnosis not present

## 2014-08-03 HISTORY — DX: Bipolar disorder, unspecified: F31.9

## 2014-08-03 LAB — CBC
HCT: 28.7 % — ABNORMAL LOW (ref 36.0–46.0)
Hemoglobin: 8.2 g/dL — ABNORMAL LOW (ref 12.0–15.0)
MCH: 21.7 pg — ABNORMAL LOW (ref 26.0–34.0)
MCHC: 28.6 g/dL — AB (ref 30.0–36.0)
MCV: 75.9 fL — ABNORMAL LOW (ref 78.0–100.0)
PLATELETS: 383 10*3/uL (ref 150–400)
RBC: 3.78 MIL/uL — AB (ref 3.87–5.11)
RDW: 17.9 % — ABNORMAL HIGH (ref 11.5–15.5)
WBC: 18.7 10*3/uL — ABNORMAL HIGH (ref 4.0–10.5)

## 2014-08-03 LAB — PROTIME-INR
INR: 1.15 (ref 0.00–1.49)
PROTHROMBIN TIME: 14.8 s (ref 11.6–15.2)

## 2014-08-03 LAB — APTT: aPTT: 30 seconds (ref 24–37)

## 2014-08-03 MED ORDER — MIDAZOLAM HCL 2 MG/2ML IJ SOLN
INTRAMUSCULAR | Status: AC | PRN
  Administered 2014-08-03: 1 mg via INTRAVENOUS

## 2014-08-03 MED ORDER — FENTANYL CITRATE 0.05 MG/ML IJ SOLN
INTRAMUSCULAR | Status: AC
  Filled 2014-08-03: qty 6

## 2014-08-03 MED ORDER — HYDROCODONE-ACETAMINOPHEN 5-325 MG PO TABS
1.0000 | ORAL_TABLET | ORAL | Status: DC | PRN
  Filled 2014-08-03: qty 2

## 2014-08-03 MED ORDER — FENTANYL CITRATE 0.05 MG/ML IJ SOLN
INTRAMUSCULAR | Status: AC | PRN
  Administered 2014-08-03: 50 ug via INTRAVENOUS

## 2014-08-03 MED ORDER — MIDAZOLAM HCL 2 MG/2ML IJ SOLN
INTRAMUSCULAR | Status: AC
  Filled 2014-08-03: qty 6

## 2014-08-03 MED ORDER — SODIUM CHLORIDE 0.9 % IV SOLN
Freq: Once | INTRAVENOUS | Status: AC
  Administered 2014-08-03: 11:00:00 via INTRAVENOUS

## 2014-08-03 NOTE — Telephone Encounter (Signed)
Barb from OB-GYN- Dr. Lahoma Crocker is calling needing some more direction for reason for referral.  They do have EPIC.  Referred to OV note 08-01-14 with Dr. Burr Medico - elevated HCG and etopic pregancy is not completely ruled out.  Raford Pitcher will try and find her a new patient appt. With Dr. Delsa Sale.

## 2014-08-03 NOTE — Discharge Instructions (Signed)
Liver Biopsy, Care After °Refer to this sheet in the next few weeks. These instructions provide you with information on caring for yourself after your procedure. Your health care provider may also give you more specific instructions. Your treatment has been planned according to current medical practices, but problems sometimes occur. Call your health care provider if you have any problems or questions after your procedure. °WHAT TO EXPECT AFTER THE PROCEDURE °After your procedure, it is typical to have the following: °· A small amount of discomfort in the area where the biopsy was done and in the right shoulder or shoulder blade. °· A small amount of bruising around the area where the biopsy was done and on the skin over the liver. °· Sleepiness and fatigue for the rest of the day. °HOME CARE INSTRUCTIONS  °· Rest at home for 1-2 days or as directed by your health care provider. °· Have a friend or family member stay with you for at least 24 hours. °· Because of the medicines used during the procedure, you should not do the following things in the first 24 hours: °¨ Drive. °¨ Use machinery. °¨ Be responsible for the care of other people. °¨ Sign legal documents. °¨ Take a bath or shower. °· There are many different ways to close and cover an incision, including stitches, skin glue, and adhesive strips. Follow your health care provider's instructions on: °¨ Incision care. °¨ Bandage (dressing) changes and removal. °¨ Incision closure removal. °· Do not drink alcohol in the first week. °· Do not lift more than 5 pounds or play contact sports for 2 weeks after this test. °· Take medicines only as directed by your health care provider. Do not take medicine containing aspirin or non-steroidal anti-inflammatory medicines such as ibuprofen for 1 week after this test. °· It is your responsibility to get your test results. °SEEK MEDICAL CARE IF:  °· You have increased bleeding from an incision that results in more than a  small spot of blood. °· You have redness, swelling, or increasing pain in any incisions. °· You notice a discharge or a bad smell coming from any of your incisions. °· You have a fever or chills. °SEEK IMMEDIATE MEDICAL CARE IF:  °· You develop swelling, bloating, or pain in your abdomen. °· You become dizzy or faint. °· You develop a rash. °· You are nauseous or vomit. °· You have difficulty breathing, feel short of breath, or feel faint. °· You develop chest pain. °· You have problems with your speech or vision. °· You have trouble balancing or moving your arms or legs. °Document Released: 03/21/2005 Document Revised: 01/16/2014 Document Reviewed: 10/28/2013 °ExitCare® Patient Information ©2015 [employer]. This information is not intended to replace advice given to you by your health care provider. Make sure you discuss any questions you have with your health care provider. °Conscious Sedation °Sedation is the use of medicines to promote relaxation and relieve discomfort and anxiety. Conscious sedation is a type of sedation. Under conscious sedation you are less alert than normal but are still able to respond to instructions or stimulation. Conscious sedation is used during short medical and dental procedures. It is milder than deep sedation or general anesthesia and allows you to return to your regular activities sooner.  °LET YOUR HEALTH CARE PROVIDER KNOW ABOUT:  °· Any allergies you have. °· All medicines you are taking, including vitamins, herbs, eye drops, creams, and over-the-counter medicines. °· Use of steroids (by mouth or creams). °·   Previous problems you or members of your family have had with the use of anesthetics. °· Any blood disorders you have. °· Previous surgeries you have had. °· Medical conditions you have. °· Possibility of pregnancy, if this applies. °· Use of cigarettes, alcohol, or illegal drugs. °RISKS AND COMPLICATIONS °Generally, this is a safe procedure. However, as with any  procedure, problems can occur. Possible problems include: °· Oversedation. °· Trouble breathing on your own. You may need to have a breathing tube until you are awake and breathing on your own. °· Allergic reaction to any of the medicines used for the procedure. °BEFORE THE PROCEDURE °· You may have blood tests done. These tests can help show how well your kidneys and liver are working. They can also show how well your blood clots. °· A physical exam will be done.   °· Only take medicines as directed by your health care provider. You may need to stop taking medicines (such as blood thinners, aspirin, or nonsteroidal anti-inflammatory drugs) before the procedure.   °· Do not eat or drink at least 6 hours before the procedure or as directed by your health care provider. °· Arrange for a responsible adult, family member, or friend to take you home after the procedure. He or she should stay with you for at least 24 hours after the procedure, until the medicine has worn off. °PROCEDURE  °· An intravenous (IV) catheter will be inserted into one of your veins. Medicine will be able to flow directly into your body through this catheter. You may be given medicine through this tube to help prevent pain and help you relax. °· The medical or dental procedure will be done. °AFTER THE PROCEDURE °· You will stay in a recovery area until the medicine has worn off. Your blood pressure and pulse will be checked.   °·  Depending on the procedure you had, you may be allowed to go home when you can tolerate liquids and your pain is under control. °Document Released: 05/27/2001 Document Revised: 09/06/2013 Document Reviewed: 05/09/2013 °ExitCare® Patient Information ©2015 [employer]. This information is not intended to replace advice given to you by your health care provider. Make sure you discuss any questions you have with your health care provider. ° °

## 2014-08-03 NOTE — Telephone Encounter (Signed)
Patient's gastroenterologist is Dr. Penelope Coop with [facility]  Phone is [phone] Larene Beach at Dr. Estell Harpin office called and left message "OK with Dr. Burr Medico to proceed with colonoscopy this Friday??"  Call back is [phone].  Note to Dr. Burr Medico

## 2014-08-03 NOTE — Telephone Encounter (Signed)
spoke with patient regarding appt with Dr. Delsa Sale today, 11/19 at 2pm- she said to leave all info on her answering machine - did so.

## 2014-08-03 NOTE — Procedures (Signed)
Post-Procedure Note  Pre-operative Diagnosis: Liver lesions        Post-operative Diagnosis: Liver lesions and perihepatic ascites   Indications: Need tissue diagnosis  Procedure Details:  US guided paracentesis.  US guided liver lesion biopsy, 3 cores.   Findings: Perihepatic ascites.  Removed 600 ml of yellow fluid.  3 cores obtained from right hepatic lesion   Complications: No immediate     Condition: Stable  Plan: Bedrest for 4 hours, on left side due to ascites.

## 2014-08-03 NOTE — H&P (Signed)
Chief Complaint: "I'm having a liver biopsy"   Referring Physician(s): Feng,Yan  History of Present Illness: [name] is a 53 y.o. female with history of fatigue , constipation, anemia, anorexia, nausea, abdominal pain, elevated CA125, mildly elevated CEA, normal AFP, previously elevated beta HCG,  and recent imaging studies revealing extensive multifocal liver lesions,HSM, ascites, anasarca, upper abdominal adenopathy. She presents today for US guided liver lesion biopsy. She has no prior cancer history.  Past Medical History  Diagnosis Date  . Mania   . Depression   . Thyroid disease   . Headache(784.0)   . Bipolar disorder     Past Surgical History  Procedure Laterality Date  . Tubal ligation      Allergies: Buspar; Cymbalta; Hydroxyzine; and Sertraline  Medications: Prior to Admission medications   Medication Sig Start Date End Date Taking? Authorizing Provider  acetaminophen-codeine (TYLENOL #3) 300-30 MG per tablet Take 1 tablet by mouth as needed (pain).    Yes Historical Provider, MD  benztropine (COGENTIN) 1 MG tablet Take 1 tablet (1 mg total) by mouth daily. Patient taking differently: Take 1 mg by mouth at bedtime.  05/19/14  Yes Levonne Spiller, MD  buPROPion (WELLBUTRIN XL) 300 MG 24 hr tablet Take 1 tablet (300 mg total) by mouth daily. 05/19/14  Yes Levonne Spiller, MD  butalbital-acetaminophen-caffeine (ESGIC PLUS) 50-500-40 MG per tablet Take 1 tablet by mouth as needed for headache.    Yes Historical Provider, MD  lactulose (CHRONULAC) 10 GM/15ML solution 15 ml q 4 hr as needed for constipation.  Max of 60 ml per day Patient taking differently: 10 g. 30 ml q 4 hr as needed for constipation.  Max of 60 ml per day 07/25/14  Yes [name], MD  levothyroxine (SYNTHROID, LEVOTHROID) 88 MCG tablet Take 88 mcg by mouth at bedtime.  09/22/13  Yes Historical Provider, MD  ondansetron (ZOFRAN) 4 MG tablet Take 1 tablet (4 mg total) by mouth every 6 (six) hours as  needed for nausea or vomiting. 08/01/14  Yes [name], MD  oxyCODONE (OXY IR/ROXICODONE) 5 MG immediate release tablet Take 1-2 tablets (5-10 mg total) by mouth every 4 (four) hours as needed for severe pain. 07/26/14  Yes [name], MD  propranolol (INDERAL) 10 MG tablet Take 1 tablet (10 mg total) by mouth 3 (three) times daily. 05/19/14  Yes Levonne Spiller, MD  risperiDONE (RISPERDAL) 3 MG tablet Take 1 tablet (3 mg total) by mouth at bedtime. 05/19/14 05/19/15 Yes Levonne Spiller, MD  zolpidem (AMBIEN) 10 MG tablet Take 1 tablet (10 mg total) by mouth at bedtime as needed for sleep. 05/19/14 08/19/14 Yes Levonne Spiller, MD  oxyCODONE-acetaminophen (PERCOCET) 5-325 MG per tablet Take 1-2 tablets by mouth every 4 (four) hours as needed. 07/21/14   Orpah Greek, MD    Family History  Problem Relation Age of Onset  . OCD Mother   . Dementia Father   . Bipolar disorder Sister   . Drug abuse Sister   . Alcohol abuse Sister   . Anxiety disorder Neg Hx   . Depression Neg Hx   . Paranoid behavior Neg Hx   . Schizophrenia Neg Hx   . Seizures Neg Hx   . Sexual abuse Neg Hx   . Physical abuse Neg Hx   . ADD / ADHD Other     History   Social History  . Marital Status: Married    Spouse Name: N/A    Number of Children:  N/A  . Years of Education: N/A   Social History Main Topics  . Smoking status: Former Smoker    Types: Cigarettes    Quit date: 09/15/1989  . Smokeless tobacco: Not on file  . Alcohol Use: No  . Drug Use: No  . Sexual Activity: Yes    Birth Control/ Protection: Surgical   Other Topics Concern  . Not on file   Social History Narrative         Review of Systems  Constitutional: Positive for appetite change, fatigue and unexpected weight change. Negative for fever and chills.  Respiratory: Negative for cough and shortness of breath.   Cardiovascular: Positive for leg swelling. Negative for chest pain.  Gastrointestinal: Positive for nausea, abdominal pain,  constipation and abdominal distention. Negative for vomiting and blood in stool.  Genitourinary: Negative for dysuria and hematuria.  Musculoskeletal: Negative for back pain.  Skin: Positive for pallor.  Neurological: Negative for headaches.  Hematological: Does not bruise/bleed easily.  Psychiatric/Behavioral:       Slightly flat affect; hx bipolar d/o    Vital Signs: BP 127/76 mmHg  Pulse 90  Temp(Src) 97.9 F (36.6 C) (Oral)  Resp 16  SpO2 96%  Physical Exam  Constitutional: She is oriented to person, place, and time.  Thin WF in NAD  Cardiovascular: Normal rate and regular rhythm.   Pulmonary/Chest: Effort normal.  Dim BS bases  Abdominal: Bowel sounds are normal. She exhibits distension. There is tenderness.  hepatosplenomegaly  Musculoskeletal: Normal range of motion. She exhibits edema.  Neurological: She is alert and oriented to person, place, and time.    Imaging: Mr Abdomen W Wo Contrast  07/28/2014   CLINICAL DATA:  Abdominal pain.  EXAM: MRI ABDOMEN WITHOUT AND WITH CONTRAST  TECHNIQUE: Multiplanar multisequence MR imaging of the abdomen was performed both before and after the administration of intravenous contrast.  CONTRAST:  5 cc of Eovist  COMPARISON:  Ultrasound 07/27/2014  FINDINGS: Lower chest:  Small bilateral pleural effusions are identified.  Hepatobiliary: There is a hepatomegaly. Hypertrophy of the lateral segment of left lobe of liver in caudate lobe of liver also noted. The liver measures 27 cm in cranial caudal dimension. There are innumerable lesions within both lobes of the liver. Index lesion within caudate lobe of liver measures 2.2 cm, image 22 of series 5015. Inferior right hepatic lobe lesion measures 5 cm, image 64/series 5015. Lateral right hepatic lobe lesion measures 2 cm, image 24/series 5015. The gallbladder appears normal. No biliary dilatation.  Pancreas: The pancreas is unremarkable. The spleen measures 12.3 cm in cranial caudal dimension.   Spleen: The spleen is enlarged measuring 13 cm in length.  Adrenals/Urinary Tract: Normal appearance of the adrenal glands. The kidneys are unremarkable.  Stomach/Bowel: Normal caliber of the of bowel loops.  Vascular/Lymphatic: The abdominal aorta has a normal caliber. Enlarged portacaval lymph node measures 1.8 cm, image 36 of series 5,015. There is no retroperitoneal adenopathy. No mesenteric adenopathy.  Other: There is abdominal and pelvic ascites. Diffuse body wall edema consistent with anasarca noted.  Musculoskeletal: No abnormal enhancement within the visualized osseous structures.  IMPRESSION: 1. Extensive multi focal liver metastasis. Right hepatic lobe liver lesion should be easily amendable to ultrasound-guided percutaneous biopsy. 2. Hepatosplenomegaly. 3. Abdominal and pelvic ascites 4. Pathologically enlarged upper abdominal lymph node concerning for metastatic adenopathy. 5. Anasarca.   Electronically Signed   By: Kerby Moors M.D.   On: 07/28/2014 17:18   US Abdomen Complete  07/21/2014  CLINICAL DATA:  Abdominal distention.  EXAM: ULTRASOUND ABDOMEN COMPLETE  COMPARISON:  None.  FINDINGS: Gallbladder: Gallbladder is not identified and may be contracted or surgically absent.  Common bile duct: Diameter: 3.7 mm, normal  Liver: Hepatic parenchymal echotexture is diffusely heterogeneous with nodular contour suggesting cirrhosis. Focal poorly defined hypoechoic nodules are demonstrated in the liver, measuring up to about 2.7 cm diameter. Appearance is worrisome for primary or metastatic neoplasm.  IVC: No abnormality visualized.  Pancreas: Not well visualized due to overlying bowel gas.  Spleen: Spleen is enlarged, measuring 16.7 cm length with volume calculated at 1022 mL.  Right Kidney: Length: 12.7 cm. Echogenicity within normal limits. No mass or hydronephrosis visualized.  Left Kidney: Length: 12.6 cm. Echogenicity within normal limits. No mass or hydronephrosis visualized.  Abdominal aorta:  No aneurysm visualized.  Other findings: Small amount of free fluid in the upper abdomen consistent with ascites. Small bilateral pleural effusions noted.  IMPRESSION: Changes of hepatic cirrhosis with focal nodules in the liver worrisome for primary or metastatic disease. Splenic enlargement. Mild ascites. Small pleural effusions.   Electronically Signed   By: Lucienne Capers M.D.   On: 07/21/2014 23:03   US Ob Comp Less 14 Wks  07/21/2014   CLINICAL DATA:  Abdominal distention. Estimated gestational age by LMP is 9 weeks 5 days. Quantitative beta HCG is 86. History of tubal ligations.  EXAM: OBSTETRIC <14 WK [place] AND TRANSVAGINAL OB US  TECHNIQUE: Both transabdominal and transvaginal ultrasound examinations were performed for complete evaluation of the gestation as well as the maternal uterus, adnexal regions, and pelvic cul-de-sac. Transvaginal technique was performed to assess early pregnancy.  COMPARISON:  None.  FINDINGS: Intrauterine gestational sac: No intrauterine gestational sac is identified.  Yolk sac:  Not identified.  Embryo:  Not identified.  Cardiac Activity: Not identified.  Heart Rate:   bpm  Maternal uterus/adnexae: There is a large amount of free fluid demonstrated in the pelvis with floating bowel loops. Fluid appears to be simple without significant debris. Uterus is anteverted. Heterogeneous myometrium with focal anterior fibroid measuring about 1.3 cm maximal diameter. Endometrium is not separately distinguished from the heterogeneous myometrium. Ovaries are not identified.  IMPRESSION: Large amount of free fluid in the pelvis. No intrauterine gestational sac, yolk sac, or fetal pole identified. Differential considerations include intrauterine pregnancy too early to be sonographically visualized, missed abortion, or ectopic pregnancy. Followup ultrasound is recommended in 10-14 days for further evaluation.   Electronically Signed   By: Lucienne Capers M.D.   On: 07/21/2014 22:43   US Ob  Transvaginal  07/21/2014   CLINICAL DATA:  Abdominal distention. Estimated gestational age by LMP is 9 weeks 5 days. Quantitative beta HCG is 86. History of tubal ligations.  EXAM: OBSTETRIC <14 WK [place] AND TRANSVAGINAL OB US  TECHNIQUE: Both transabdominal and transvaginal ultrasound examinations were performed for complete evaluation of the gestation as well as the maternal uterus, adnexal regions, and pelvic cul-de-sac. Transvaginal technique was performed to assess early pregnancy.  COMPARISON:  None.  FINDINGS: Intrauterine gestational sac: No intrauterine gestational sac is identified.  Yolk sac:  Not identified.  Embryo:  Not identified.  Cardiac Activity: Not identified.  Heart Rate:   bpm  Maternal uterus/adnexae: There is a large amount of free fluid demonstrated in the pelvis with floating bowel loops. Fluid appears to be simple without significant debris. Uterus is anteverted. Heterogeneous myometrium with focal anterior fibroid measuring about 1.3 cm maximal diameter. Endometrium is not separately distinguished  from the heterogeneous myometrium. Ovaries are not identified.  IMPRESSION: Large amount of free fluid in the pelvis. No intrauterine gestational sac, yolk sac, or fetal pole identified. Differential considerations include intrauterine pregnancy too early to be sonographically visualized, missed abortion, or ectopic pregnancy. Followup ultrasound is recommended in 10-14 days for further evaluation.   Electronically Signed   By: Lucienne Capers M.D.   On: 07/21/2014 22:43   US Abdomen Limited  07/27/2014   CLINICAL DATA:  Abdominal distention.  EXAM: US ABDOMEN LIMITED - RIGHT UPPER QUADRANT  COMPARISON:  None.  FINDINGS: Mild ascites noted. Given the small volume of ascites, no paracentesis performed.  IMPRESSION: Mild ascites.  No paracentesis performed.   Electronically Signed   By: Marcello Moores  Register   On: 07/27/2014 10:39   Dg Abd Acute W/chest  07/21/2014   CLINICAL DATA:  Abdominal  pain, nausea and vomiting for 2 weeks  EXAM: ACUTE ABDOMEN SERIES (ABDOMEN 2 VIEW & CHEST 1 VIEW)  COMPARISON:  None.  FINDINGS: Mild enlargement of the cardiac silhouette is noted. Trace left pleural fluid is present. Diffusely prominent interstitial markings are noted without focal pulmonary opacity. No free air beneath the diaphragms.  Loops of bowel are mildly centrally displaced which may be seen with ascites or organomegaly. No dilated gas-filled loop of small or large bowel is identified. No differential air-fluid level. No acute osseous finding.  IMPRESSION: Diffusely prominent interstitial lung markings without focal pulmonary opacity.  Nonobstructing bowel gas pattern with central displacement of loops of bowel which may indicate ascites or organomegaly.   Electronically Signed   By: Conchita Paris M.D.   On: 07/21/2014 17:27   Mm Digital Screening Bilateral  07/14/2014   CLINICAL DATA:  Screening.  EXAM: DIGITAL SCREENING BILATERAL MAMMOGRAM WITH CAD  COMPARISON:  Previous exam(s).  ACR Breast Density Category c: The breast tissue is heterogeneously dense, which may obscure small masses.  FINDINGS: There are no findings suspicious for malignancy. Images were processed with CAD.  IMPRESSION: No mammographic evidence of malignancy. A result letter of this screening mammogram will be mailed directly to the patient.  RECOMMENDATION: Screening mammogram in one year. (Code:SM-B-01Y)  BI-RADS CATEGORY  1: Negative.   Electronically Signed   By: Lovey Newcomer M.D.   On: 07/14/2014 15:29    Labs:  CBC:  Recent Labs  07/21/14 1352 07/25/14 1643 08/01/14 1326  WBC 19.3* 18.3* 23.2*  HGB 9.2* 8.0* 8.2*  HCT 30.8* 27.1* 28.9*  PLT 409* 332 421*    COAGS:  Recent Labs  08/01/14 1328  INR 1.14  APTT 30.8    BMP:  Recent Labs  07/21/14 1352 07/25/14 1643 08/01/14 1330  NA 141 142 141  K 3.8 4.4 4.2  CL 101 104  --   CO2 24 24 26   GLUCOSE 96 96 90  BUN 11 23 11.1  CALCIUM 9.1 8.7  8.9  CREATININE 0.67 0.99 0.7  GFRNONAA >90  --   --   GFRAA >90  --   --     LIVER FUNCTION TESTS:  Recent Labs  07/21/14 1352 08/01/14 1330  BILITOT 0.6 0.84  AST 20 13  ALT 15 7  ALKPHOS 138* 190*  PROT 6.1 5.9*  ALBUMIN 2.4* 2.3*    TUMOR MARKERS:  Recent Labs  07/25/14 0920 08/01/14 1328  AFPTM 1.9  --   CEA  --  23.2*    Assessment and Plan: LABREA ECCLESTON is a 54 y.o. female with history of  fatigue , constipation, anemia, anorexia, nausea, abdominal pain, elevated CA125, mildly elevated CEA, normal AFP, previously elevated beta HCG,  and recent imaging studies revealing extensive multifocal liver lesions,HSM, ascites, anasarca, upper abdominal adenopathy. She presents today for US guided liver lesion biopsy/possible paracentesis if necessary. She has no prior cancer history. Details/risks of procedure d/w pt/husband with their understanding and consent.           Signed: Autumn Messing 08/03/2014, 10:42 AM

## 2014-08-03 NOTE — Telephone Encounter (Signed)
patient's daughter called and changed appt to Monday 11/23 at 9:30am - stated patient was having liver biopsy 11/19am and could not make appt due to that today

## 2014-08-07 ENCOUNTER — Ambulatory Visit (INDEPENDENT_AMBULATORY_CARE_PROVIDER_SITE_OTHER): Payer: Medicare HMO | Admitting: Obstetrics & Gynecology

## 2014-08-07 ENCOUNTER — Ambulatory Visit (HOSPITAL_COMMUNITY)
Admission: RE | Admit: 2014-08-07 | Discharge: 2014-08-07 | Disposition: A | Discharge status: Home / Self Care | Payer: Medicare HMO | Source: Ambulatory Visit | Attending: Hematology | Admitting: Hematology

## 2014-08-07 ENCOUNTER — Encounter: Payer: Self-pay | Admitting: Obstetrics & Gynecology

## 2014-08-07 VITALS — BP 127/90 | HR 107 | Ht 66.0 in | Wt 133.0 lb

## 2014-08-07 DIAGNOSIS — E349 Endocrine disorder, unspecified: Secondary | ICD-10-CM

## 2014-08-07 DIAGNOSIS — R188 Other ascites: Secondary | ICD-10-CM | POA: Diagnosis present

## 2014-08-07 DIAGNOSIS — I313 Pericardial effusion (noninflammatory): Secondary | ICD-10-CM | POA: Diagnosis not present

## 2014-08-07 DIAGNOSIS — C227 Other specified carcinomas of liver: Secondary | ICD-10-CM | POA: Insufficient documentation

## 2014-08-07 DIAGNOSIS — R160 Hepatomegaly, not elsewhere classified: Secondary | ICD-10-CM

## 2014-08-07 DIAGNOSIS — M5134 Other intervertebral disc degeneration, thoracic region: Secondary | ICD-10-CM | POA: Insufficient documentation

## 2014-08-07 DIAGNOSIS — C799 Secondary malignant neoplasm of unspecified site: Secondary | ICD-10-CM | POA: Diagnosis not present

## 2014-08-07 DIAGNOSIS — Z01419 Encounter for gynecological examination (general) (routine) without abnormal findings: Secondary | ICD-10-CM

## 2014-08-07 DIAGNOSIS — J90 Pleural effusion, not elsewhere classified: Secondary | ICD-10-CM | POA: Insufficient documentation

## 2014-08-07 DIAGNOSIS — J9811 Atelectasis: Secondary | ICD-10-CM | POA: Diagnosis not present

## 2014-08-07 DIAGNOSIS — Z0000 Encounter for general adult medical examination without abnormal findings: Secondary | ICD-10-CM

## 2014-08-07 DIAGNOSIS — Z3201 Encounter for pregnancy test, result positive: Secondary | ICD-10-CM

## 2014-08-07 DIAGNOSIS — R5383 Other fatigue: Secondary | ICD-10-CM | POA: Diagnosis present

## 2014-08-07 LAB — POCT URINE PREGNANCY: Preg Test, Ur: POSITIVE

## 2014-08-08 ENCOUNTER — Ambulatory Visit: Payer: Self-pay

## 2014-08-08 ENCOUNTER — Telehealth: Payer: Self-pay | Admitting: Hematology

## 2014-08-08 ENCOUNTER — Telehealth: Payer: Self-pay | Admitting: *Deleted

## 2014-08-08 ENCOUNTER — Ambulatory Visit (HOSPITAL_BASED_OUTPATIENT_CLINIC_OR_DEPARTMENT_OTHER): Payer: Medicare HMO | Admitting: Hematology

## 2014-08-08 VITALS — BP 117/82 | HR 102 | Resp 18 | Ht 66.0 in | Wt 131.9 lb

## 2014-08-08 DIAGNOSIS — R160 Hepatomegaly, not elsewhere classified: Secondary | ICD-10-CM

## 2014-08-08 DIAGNOSIS — C799 Secondary malignant neoplasm of unspecified site: Secondary | ICD-10-CM

## 2014-08-08 DIAGNOSIS — D630 Anemia in neoplastic disease: Secondary | ICD-10-CM

## 2014-08-08 DIAGNOSIS — C801 Malignant (primary) neoplasm, unspecified: Secondary | ICD-10-CM

## 2014-08-08 DIAGNOSIS — C787 Secondary malignant neoplasm of liver and intrahepatic bile duct: Secondary | ICD-10-CM

## 2014-08-08 DIAGNOSIS — E349 Endocrine disorder, unspecified: Secondary | ICD-10-CM | POA: Insufficient documentation

## 2014-08-08 LAB — PAP IG W/ RFLX HPV ASCU

## 2014-08-08 LAB — HCG, QUANTITATIVE, PREGNANCY: hCG, Beta Chain, Quant, S: 31.3 m[IU]/mL

## 2014-08-08 MED ORDER — OXYCODONE HCL 5 MG PO TABS: 5.0000 mg | ORAL_TABLET | ORAL | Status: DC | PRN

## 2014-08-08 MED ORDER — OXYCODONE HCL ER 10 MG PO T12A: 10.0000 mg | EXTENDED_RELEASE_TABLET | Freq: Two times a day (BID) | ORAL | Status: DC

## 2014-08-08 NOTE — Telephone Encounter (Signed)
-----   Message from Lahoma Crocker, MD sent at 08/08/2014  8:54 AM EST ----- Needs bHCG next week.

## 2014-08-08 NOTE — Telephone Encounter (Signed)
Gave avs & cal. Sent mess to sch tx. Appt Sch w/ Dr.Brewster for 08/17/14 @ 12:15.

## 2014-08-08 NOTE — Progress Notes (Signed)
Patient ID: [name], female   DOB: 1960-10-25, 53 y.o.   MRN: [id number]  Chief Complaint  Patient presents with  . NP/ Problem    HPI [name] is a 53 y.o. female.  She is referred for an elevated serum bHCG--26. 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  The ascites from the paracentesis was negative.  A pelvic U/S was negative.    HPI  Past Medical History  Diagnosis Date  . Mania   . Depression   . Thyroid disease   . Headache(784.0)   . Bipolar disorder     Past Surgical History  Procedure Laterality Date  . Tubal ligation      Family History  Problem Relation Age of Onset  . OCD Mother   . Dementia Father   . Bipolar disorder Sister   . Drug abuse Sister   . Alcohol abuse Sister   . Anxiety disorder Neg Hx   . Depression Neg Hx   . Paranoid behavior Neg Hx   . Schizophrenia Neg Hx   . Seizures Neg Hx   . Sexual abuse Neg Hx   . Physical abuse Neg Hx   . ADD / ADHD Other     Social History History  Substance Use Topics  . Smoking status: Former Smoker    Types: Cigarettes    Quit date: 09/15/1989  . Smokeless tobacco: Never Used  . Alcohol Use: No    Allergies  Allergen Reactions  . Buspar [Buspirone] Other (See Comments)    More irritable and "goes off the handle"  . Cymbalta [Duloxetine Hcl] Other (See Comments)    Worked against her, husband can't remember exactly how  . Hydroxyzine Other (See Comments)    Doesn't work that well and causes constipation.  . Sertraline Other (See Comments)    Worked against her, but husband can not remember how so    Current Outpatient Prescriptions  Medication Sig Dispense Refill  . acetaminophen-codeine (TYLENOL #3) 300-30 MG per tablet Take 1 tablet by mouth as needed (pain).     . benztropine (COGENTIN) 1 MG tablet Take 1 tablet (1 mg total) by mouth daily. (Patient taking differently: Take 1 mg by mouth at bedtime. ) 90 tablet 2  . buPROPion (WELLBUTRIN XL) 300  MG 24 hr tablet Take 1 tablet (300 mg total) by mouth daily. 90 tablet 2  . butalbital-acetaminophen-caffeine (ESGIC PLUS) 50-500-40 MG per tablet Take 1 tablet by mouth as needed for headache.     . lactulose (CHRONULAC) 10 GM/15ML solution 15 ml q 4 hr as needed for constipation.  Max of 60 ml per day (Patient taking differently: 10 g. 30 ml q 4 hr as needed for constipation.  Max of 60 ml per day) 500 mL 2  . levothyroxine (SYNTHROID, LEVOTHROID) 88 MCG tablet Take 88 mcg by mouth at bedtime.     . ondansetron (ZOFRAN) 4 MG tablet Take 1 tablet (4 mg total) by mouth every 6 (six) hours as needed for nausea or vomiting. 60 tablet 4  . oxyCODONE (OXY IR/ROXICODONE) 5 MG immediate release tablet Take 1-2 tablets (5-10 mg total) by mouth every 4 (four) hours as needed for severe pain. 60 tablet 0  . oxyCODONE-acetaminophen (PERCOCET) 5-325 MG per tablet Take 1-2 tablets by mouth every 4 (four) hours as needed. 20 tablet 0  . propranolol (INDERAL) 10 MG tablet Take 1 tablet (10 mg total) by mouth 3 (three) times daily. [place]  tablet 2  . risperiDONE (RISPERDAL) 3 MG tablet Take 1 tablet (3 mg total) by mouth at bedtime. 90 tablet 2  . zolpidem (AMBIEN) 10 MG tablet Take 1 tablet (10 mg total) by mouth at bedtime as needed for sleep. 90 tablet 1   No current facility-administered medications for this visit.    Review of Systems Review of Systems Constitutional: negative for fatigue and weight loss Respiratory: negative for cough and wheezing Cardiovascular: negative for chest pain, fatigue and palpitations Gastrointestinal: negative for abdominal pain and change in bowel habits Genitourinary:negative Integument/breast: negative for nipple discharge Musculoskeletal:negative for myalgias Neurological: negative for gait problems and tremors Behavioral/Psych: negative for abusive relationship, depression Endocrine: negative for temperature intolerance     Blood pressure 127/90, pulse 107, height 5'  6" (1.676 m), weight 60.328 kg (133 lb).  Physical Exam Physical Exam General:   alert  Skin:   no rash or abnormalities  Lungs:   clear to auscultation bilaterally  Heart:   regular rate and rhythm, S1, S2 normal, no murmur, click, rub or gallop  Breasts:   normal without suspicious masses, skin or nipple changes or axillary nodes  Abdomen:  hepatomegaly, NT  Pelvis:  External genitalia: normal general appearance Urinary system: urethral meatus normal and bladder without fullness, nontender Vaginal: normal without tenderness, induration or masses Cervix: normal appearance Adnexa: normal bimanual exam Uterus: anteverted and non-tender, normal size      Data Reviewed Labs, U/S, Pathology  Assessment    Mildly elevated quantitative hCG--ddx early pregnancy failure with unknown location, non-pregnancy/trophoblastic source, early menopause    Plan   Serial bHCG Orders Placed This Encounter  Procedures  . hCG, quantitative, pregnancy  . POCT urine pregnancy  Pap smear performed Need radiologic imaging of the ovaries--either CT scan or MRI with views of the pelvis Follow up as needed or in  2 weeks         JACKSON-MOORE,Benjamine Strout A 08/08/2014, 8:47 AM

## 2014-08-08 NOTE — Telephone Encounter (Signed)
Placed call to pt. Spoke with pt husband.  Made him aware that we need to speak with his wife to schedule appt.  Pt husband states he will have their daughter call office to schedule appt for [name].

## 2014-08-09 ENCOUNTER — Encounter: Payer: Self-pay | Admitting: *Deleted

## 2014-08-09 ENCOUNTER — Ambulatory Visit (HOSPITAL_COMMUNITY): Payer: Self-pay | Admitting: Psychiatry

## 2014-08-09 ENCOUNTER — Other Ambulatory Visit: Payer: Medicare HMO

## 2014-08-09 ENCOUNTER — Ambulatory Visit (HOSPITAL_COMMUNITY)
Admission: RE | Admit: 2014-08-09 | Discharge: 2014-08-09 | Disposition: A | Discharge status: Home / Self Care | Payer: Medicare HMO | Source: Ambulatory Visit | Attending: Hematology | Admitting: Hematology

## 2014-08-09 ENCOUNTER — Other Ambulatory Visit (HOSPITAL_BASED_OUTPATIENT_CLINIC_OR_DEPARTMENT_OTHER): Payer: Medicare HMO

## 2014-08-09 ENCOUNTER — Encounter: Payer: Self-pay | Admitting: Hematology

## 2014-08-09 ENCOUNTER — Telehealth: Payer: Self-pay | Admitting: *Deleted

## 2014-08-09 DIAGNOSIS — C801 Malignant (primary) neoplasm, unspecified: Secondary | ICD-10-CM | POA: Insufficient documentation

## 2014-08-09 DIAGNOSIS — D630 Anemia in neoplastic disease: Secondary | ICD-10-CM

## 2014-08-09 DIAGNOSIS — R160 Hepatomegaly, not elsewhere classified: Secondary | ICD-10-CM | POA: Diagnosis not present

## 2014-08-09 DIAGNOSIS — C799 Secondary malignant neoplasm of unspecified site: Secondary | ICD-10-CM

## 2014-08-09 DIAGNOSIS — C787 Secondary malignant neoplasm of liver and intrahepatic bile duct: Secondary | ICD-10-CM | POA: Insufficient documentation

## 2014-08-09 LAB — HOLD TUBE, BLOOD BANK

## 2014-08-09 LAB — CBC WITH DIFFERENTIAL/PLATELET
BASO%: 0.5 % (ref 0.0–2.0)
Basophils Absolute: 0.1 10e3/uL (ref 0.0–0.1)
EOS%: 0.2 % (ref 0.0–7.0)
Eosinophils Absolute: 0.1 10e3/uL (ref 0.0–0.5)
HCT: 27.1 % — ABNORMAL LOW (ref 34.8–46.6)
HGB: 7.9 g/dL — ABNORMAL LOW (ref 11.6–15.9)
LYMPH%: 5 % — ABNORMAL LOW (ref 14.0–49.7)
MCH: 20.8 pg — ABNORMAL LOW (ref 25.1–34.0)
MCHC: 29.1 g/dL — ABNORMAL LOW (ref 31.5–36.0)
MCV: 71.6 fL — ABNORMAL LOW (ref 79.5–101.0)
MONO#: 2.5 10e3/uL — ABNORMAL HIGH (ref 0.1–0.9)
MONO%: 11.6 % (ref 0.0–14.0)
NEUT#: 17.9 10e3/uL — ABNORMAL HIGH (ref 1.5–6.5)
NEUT%: 82.7 % — ABNORMAL HIGH (ref 38.4–76.8)
Platelets: 332 10e3/uL (ref 145–400)
RBC: 3.78 10e6/uL (ref 3.70–5.45)
RDW: 19.9 % — ABNORMAL HIGH (ref 11.2–14.5)
WBC: 21.7 10e3/uL — ABNORMAL HIGH (ref 3.9–10.3)
lymph#: 1.1 10e3/uL (ref 0.9–3.3)

## 2014-08-09 LAB — ABO/RH: ABO/RH(D): O POS

## 2014-08-09 LAB — PREPARE RBC (CROSSMATCH)

## 2014-08-09 NOTE — Progress Notes (Signed)
Patient and daughter aware of Hgb result, 7.9.  Will keep app't for blood on 11/28.

## 2014-08-09 NOTE — Progress Notes (Signed)
No episodes in as of today. She starts 08/18/14

## 2014-08-09 NOTE — Telephone Encounter (Signed)
Per staff message and POF I have scheduled appts. Advised scheduler of appts. JMW  

## 2014-08-09 NOTE — Progress Notes (Signed)
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 Hepatosplenomegaly. Abdominal and pelvic ascites and Pathologically enlarged upper abdominal. CT chest (-).    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 Bipolar 2. Hypothyroidism   INTERVAL HISTORY: She returns to discuss her biopsy results. She has not had a significant change since her last visit. She still quite fatigued, able to self-care, and move around at home.. She continue complaints of abdominal pain and distention, she takes oxycodone every 4 hours with reasonable control of her abdominal pain. nausea is controlled by Zofran. No fever or chills. Able to eat and drink some. No dizziness.  REVIEW OF SYSTEMS:   Constitutional: Denies fevers, chills, (+) weight loss and very fatigued  Eyes: Denies blurriness of vision Ears, nose, mouth, throat, and face: Denies mucositis or sore throat Respiratory: Denies cough, dyspnea or wheezes Cardiovascular: Denies palpitation, chest discomfort or lower extremity swelling Gastrointestinal:  (+) nausea better with Zofran, no heartburn or change in bowel habits Skin: Denies abnormal skin rashes. (+) b/l leg swelling  Lymphatics: Denies new lymphadenopathy or easy bruising Neurological:Denies numbness, tingling or new weaknesses Behavioral/Psych: Mood is stable, no new changes  All other systems were reviewed with the patient and are negative.  I have reviewed the past medical history, past surgical history, social history and family history with the patient and  they are unchanged from previous note.  ALLERGIES:  is allergic to buspar; cymbalta; hydroxyzine; and sertraline.  MEDICATIONS:  Current Outpatient Prescriptions  Medication Sig Dispense Refill  . acetaminophen-codeine (TYLENOL #3) 300-30 MG per tablet Take 1 tablet by mouth as needed (pain).     . benztropine (COGENTIN) 1 MG tablet Take 1 tablet (1 mg total) by mouth daily. (Patient taking differently: Take 1 mg by mouth at bedtime. ) 90 tablet 2  . buPROPion (WELLBUTRIN XL) 300 MG 24 hr tablet Take 1 tablet (300 mg total) by mouth daily. 90 tablet 2  . butalbital-acetaminophen-caffeine (ESGIC PLUS) 50-500-40 MG per tablet Take 1 tablet by mouth as needed for headache.     . lactulose (CHRONULAC) 10 GM/15ML solution 15 ml q 4 hr as needed for constipation.  Max of 60 ml per day (Patient taking differently: 10 g. 30 ml q 4 hr as needed for constipation.  Max of 60 ml per day) 500 mL 2  . levothyroxine (SYNTHROID, LEVOTHROID) 88 MCG tablet Take 88 mcg by mouth at bedtime.     . ondansetron (ZOFRAN) 4 MG tablet Take 1 tablet (4 mg total) by mouth every 6 (six) hours as needed for nausea or vomiting. 60 tablet 4  . oxyCODONE (OXY IR/ROXICODONE) 5 MG immediate release tablet Take 1 tablet (5 mg total) by mouth every 4 (four) hours as needed for severe pain. 90 tablet 0  . propranolol (INDERAL) 10 MG tablet Take 1 tablet (10 mg total) by mouth 3 (three) times daily. 270 tablet 2  . risperiDONE (RISPERDAL) 3 MG tablet Take 1 tablet (3 mg total) by mouth at bedtime. 90 tablet 2  . zolpidem (AMBIEN) 10 MG tablet Take 1 tablet (10 mg  total) by mouth at bedtime as needed for sleep. 90 tablet 1  . OxyCODONE (OXYCONTIN) 10 mg T12A 12 hr tablet Take 1 tablet (10 mg total) by mouth every 12 (twelve) hours. 60 tablet 0   No current facility-administered medications for this visit.    PHYSICAL EXAMINATION: ECOG PERFORMANCE STATUS: 3 - Symptomatic, >50% confined to bed  Filed Vitals:   08/08/14 1342  BP:  117/82  Pulse: 102  Resp: 18   Filed Weights   08/08/14 1335 08/08/14 1342  Weight: 131 lb 14.4 oz (59.829 kg) 131 lb 14.4 oz (59.829 kg)    GENERAL:alert, no distress and comfortable SKIN: skin color, texture, turgor are normal, no rashes or significant lesions EYES: normal, Conjunctiva are pink and non-injected, sclera clear OROPHARYNX:no exudate, no erythema and lips, buccal mucosa, and tongue normal  NECK: supple, thyroid normal size, non-tender, without nodularity LYMPH:  no palpable lymphadenopathy in the cervical, axillary or inguinal LUNGS: clear to auscultation and percussion with normal breathing effort HEART: regular rate & rhythm and no murmurs and no lower extremity edema ABDOMEN:abdomen soft, non-tender and normal bowel sounds. (+) distended, (+) hepatomegaly, firm, palpable 10cm under rib cage  Musculoskeletal:no cyanosis of digits and no clubbing  NEURO: alert & oriented x 3 with fluent speech, no focal motor/sensory deficits  LABORATORY DATA:  I have reviewed the data as listed    Component Value Date/Time   NA 141 08/01/2014 1330   NA 142 07/25/2014 1643   K 4.2 08/01/2014 1330   K 4.4 07/25/2014 1643   CL 104 07/25/2014 1643   CO2 26 08/01/2014 1330   CO2 24 07/25/2014 1643   GLUCOSE 90 08/01/2014 1330   GLUCOSE 96 07/25/2014 1643   BUN 11.1 08/01/2014 1330   BUN 23 07/25/2014 1643   CREATININE 0.7 08/01/2014 1330   CREATININE 0.99 07/25/2014 1643   CREATININE 0.84 12/18/2011 1012   CALCIUM 8.9 08/01/2014 1330   CALCIUM 8.7 07/25/2014 1643   PROT 5.9* 08/01/2014 1330   PROT 6.1 07/21/2014 1352   ALBUMIN 2.3* 08/01/2014 1330   ALBUMIN 2.4* 07/21/2014 1352   AST 13 08/01/2014 1330   AST 20 07/21/2014 1352   ALT 7 08/01/2014 1330   ALT 15 07/21/2014 1352   ALKPHOS 190* 08/01/2014 1330   ALKPHOS 138* 07/21/2014 1352   BILITOT 0.84 08/01/2014 1330   BILITOT 0.6 07/21/2014 1352   GFRNONAA >90 07/21/2014 1352   GFRAA >90 07/21/2014 1352    No  results found for: SPEP, UPEP  Lab Results  Component Value Date   WBC 21.7* 08/09/2014   NEUTROABS 17.9* 08/09/2014   HGB 7.9* 08/09/2014   HCT 27.1* 08/09/2014   MCV 71.6* 08/09/2014   PLT 332 08/09/2014      Chemistry      Component Value Date/Time   NA 141 08/01/2014 1330   NA 142 07/25/2014 1643   K 4.2 08/01/2014 1330   K 4.4 07/25/2014 1643   CL 104 07/25/2014 1643   CO2 26 08/01/2014 1330   CO2 24 07/25/2014 1643   BUN 11.1 08/01/2014 1330   BUN 23 07/25/2014 1643   CREATININE 0.7 08/01/2014 1330   CREATININE 0.99 07/25/2014 1643   CREATININE 0.84 12/18/2011 1012      Component Value Date/Time   CALCIUM 8.9 08/01/2014 1330   CALCIUM 8.7 07/25/2014 1643   ALKPHOS 190* 08/01/2014 1330   ALKPHOS 138* 07/21/2014 1352   AST 13 08/01/2014 1330   AST 20 07/21/2014 1352  ALT 7 08/01/2014 1330   ALT 15 07/21/2014 1352   BILITOT 0.84 08/01/2014 1330   BILITOT 0.6 07/21/2014 1352     Surgical path 08/03/14 - ADENOCARCINOMA. Microscopic Comment The core biopsies are extensively involved by adenocarcinoma with focal goblet cells and foci with signet ring morphology. Immunohistochemistry will be performed and reported as an addendum. (JDP:gt, 08/04/14) Addendum: Immunohistochemistry is performed and the tumor is positive with Cytokeratin 7 and negative with Cytokeratin 20, CDX2, thyroid transcription factor-1, Napsin A, estrogen receptor, progesterone receptor and gross cystic disease fluid protein. The immunophenotype is nonspecific.  RADIOGRAPHIC STUDIES: I have personally reviewed the radiological images as listed and agreed with the findings in the report.  I have reviewed her EGD and colonoscopy reports which was done on 11/20, which was negative for ulceration or mass.   ASSESSMENT & PLAN:  53 yo female with PMH of bipolar but otherwise healthy, no history of liver disease or alcohol abuse, no history of blood transfusion, who presented with significant N/V,  abdominal pain and bloating, New discovered multiple liver masses and small ascites, and elevated serum beta HCG, CA125 and CEA.   1. Metastatic adenocarcinoma to liver, unknown primary. Differential including gynecological (endometrial or ovarian cancer), cholangiocarcinoma, or occluded pancreatic cancer. - I reviewed her biopsy results with patient and her family members. Unfortunately, the IHC is not specific. But based on the IHC results and imaging findings, I can exclude lung cancer, gastric or colon cancer. Given the significantly elevated CA-125 and beta hCG, endometrial cancer or ovary cancer I high on the differential list. However her vaginal ultrasound was negative for ovary mass . I have discussed the case with our GYN oncologist Dr. Skeet Latch, who will see her and obtain endometrial biopsy. If this negative, then I think this is likely a cholangiocarcinoma or occluded pancreatic cancer. -The incurable nature of the disease was extensive discussed with the patient and her husband and daughter. Giving her mental illness, I'm not sure how much she is able to understand. I discussed the goal of therapy is palliation. -I discussed the option of palliative chemotherapy versus positive care alone. The patient and her family is anxious to start treatment and opted to try chemotherapy. I recommend start chemotherapy with carboplatin and paclitaxel next week. If she does not response to dislodgment, I will likely switch to gemcitabine or 5-FU-based regimen. Given her limited performance status I'll give low-dose chemotherapy for the first cycle. -I'll present her case in our GI tumor Board next week to review her images and biopsy results.   2. Anemia, probably related to her underline malignancy.  -We'll repeat CBC today. She is symptomatic from her anemia. - Blood transfusion 2 units RBC this week. Consent obtained.  3. Bilateral leg edema -I checked Doppler ultrasound on her legs which showed no  evidence of DVT. -this is likely related to her hypoalbuminemia, and the poor venous return. I encouraged her to elevate her legs and the use compression stocks. I would not use Lasix due to her poor by mouth intake.  4. Elevate beta hCG -she was seen by OB/GYN Dr. Laurance Flatten and will follow up.   5. Malnutrition and deconditioning secondary to her probably underline malignancy  -nutrition consult   I will see her back next week  before her first cycle of chemotherapy.    Orders Placed This Encounter  Procedures  . CBC with Differential    Standing Status: Future     Number of Occurrences: 1  Standing Expiration Date: 08/09/2015  . Ambulatory referral to Gynecologic Oncology    Referral Priority:  Routine    Referral Type:  Consultation    Referral Reason:  Specialty Services Required    Requested Specialty:  Oncology    Number of Visits Requested:  1  . Ambulatory referral to Social Work    Referral Priority:  Routine    Referral Type:  Consultation    Referral Reason:  Specialty Services Required    Number of Visits Requested:  1  . Care order/instruction    Transfuse Parameters    Standing Status: Future     Number of Occurrences:      Standing Expiration Date: 08/09/2015  . Practitioner attestation of consent    I, the ordering practitioner, attest that I have discussed with the patient the benefits, risks, side effects, alternatives, likelihood of achieving goals and potential problems during recovery for the procedure listed.    Standing Status: Future     Number of Occurrences:      Standing Expiration Date: 08/09/2015    Order Specific Question:  Procedure    Answer:  Blood Product(s)  . Type and screen    Standing Status: Future     Number of Occurrences: 1     Standing Expiration Date: 08/09/2015   All questions were answered. The patient knows to call the clinic with any problems, questions or concerns. No barriers to learning was detected. I spent 40  minutes counseling the patient face to face. The total time spent in the appointment was 50 minutes and more than 50% was on counseling and review of test results     [name], MD 08/09/2014 10:51 PM

## 2014-08-11 NOTE — Telephone Encounter (Signed)
appt has been made for 08-17-14

## 2014-08-12 ENCOUNTER — Ambulatory Visit (HOSPITAL_BASED_OUTPATIENT_CLINIC_OR_DEPARTMENT_OTHER): Payer: Medicare HMO

## 2014-08-12 VITALS — BP 114/81 | HR 82 | Temp 97.8°F | Resp 18

## 2014-08-12 DIAGNOSIS — C799 Secondary malignant neoplasm of unspecified site: Secondary | ICD-10-CM

## 2014-08-12 DIAGNOSIS — C801 Malignant (primary) neoplasm, unspecified: Secondary | ICD-10-CM

## 2014-08-12 DIAGNOSIS — R160 Hepatomegaly, not elsewhere classified: Secondary | ICD-10-CM

## 2014-08-12 DIAGNOSIS — D630 Anemia in neoplastic disease: Secondary | ICD-10-CM

## 2014-08-12 MED ORDER — SODIUM CHLORIDE 0.9 % IV SOLN
250.0000 mL | Freq: Once | INTRAVENOUS | Status: AC
  Administered 2014-08-12: 250 mL via INTRAVENOUS

## 2014-08-12 NOTE — Patient Instructions (Signed)

## 2014-08-13 LAB — TYPE AND SCREEN
ABO/RH(D): O POS
ANTIBODY SCREEN: NEGATIVE
UNIT DIVISION: 0
Unit division: 0

## 2014-08-14 ENCOUNTER — Other Ambulatory Visit: Payer: Self-pay | Admitting: Hematology

## 2014-08-17 ENCOUNTER — Ambulatory Visit: Payer: Medicare HMO | Attending: Gynecologic Oncology | Admitting: Gynecologic Oncology

## 2014-08-17 ENCOUNTER — Other Ambulatory Visit: Payer: Self-pay | Admitting: *Deleted

## 2014-08-17 ENCOUNTER — Other Ambulatory Visit: Payer: Medicare HMO

## 2014-08-17 ENCOUNTER — Encounter: Payer: Self-pay | Admitting: Gynecologic Oncology

## 2014-08-17 VITALS — BP 135/93 | HR 96 | Temp 97.8°F | Resp 18 | Ht 66.0 in | Wt 131.7 lb

## 2014-08-17 DIAGNOSIS — Z87891 Personal history of nicotine dependence: Secondary | ICD-10-CM | POA: Diagnosis not present

## 2014-08-17 DIAGNOSIS — I313 Pericardial effusion (noninflammatory): Secondary | ICD-10-CM | POA: Diagnosis not present

## 2014-08-17 DIAGNOSIS — C787 Secondary malignant neoplasm of liver and intrahepatic bile duct: Secondary | ICD-10-CM | POA: Diagnosis not present

## 2014-08-17 DIAGNOSIS — E349 Endocrine disorder, unspecified: Secondary | ICD-10-CM

## 2014-08-17 DIAGNOSIS — C801 Malignant (primary) neoplasm, unspecified: Secondary | ICD-10-CM

## 2014-08-17 DIAGNOSIS — J9811 Atelectasis: Secondary | ICD-10-CM | POA: Insufficient documentation

## 2014-08-17 DIAGNOSIS — Z79899 Other long term (current) drug therapy: Secondary | ICD-10-CM | POA: Insufficient documentation

## 2014-08-17 DIAGNOSIS — J90 Pleural effusion, not elsewhere classified: Secondary | ICD-10-CM | POA: Insufficient documentation

## 2014-08-17 NOTE — Patient Instructions (Addendum)
Lab work will be obtained today.  Please follow up with Dr. Lester [place] office about arranging a D&C.  Followup in 3 months with Dr.Brewster. Please call us with any questions or concerns.

## 2014-08-18 ENCOUNTER — Ambulatory Visit (HOSPITAL_BASED_OUTPATIENT_CLINIC_OR_DEPARTMENT_OTHER): Payer: Medicare HMO

## 2014-08-18 ENCOUNTER — Ambulatory Visit: Payer: Medicare HMO | Admitting: Nutrition

## 2014-08-18 ENCOUNTER — Telehealth: Payer: Self-pay | Admitting: Hematology

## 2014-08-18 ENCOUNTER — Encounter: Payer: Self-pay | Admitting: *Deleted

## 2014-08-18 ENCOUNTER — Other Ambulatory Visit (HOSPITAL_BASED_OUTPATIENT_CLINIC_OR_DEPARTMENT_OTHER): Payer: Medicare HMO

## 2014-08-18 ENCOUNTER — Ambulatory Visit (HOSPITAL_COMMUNITY): Payer: Self-pay | Admitting: Psychiatry

## 2014-08-18 ENCOUNTER — Other Ambulatory Visit: Payer: Self-pay | Admitting: Hematology & Oncology

## 2014-08-18 ENCOUNTER — Other Ambulatory Visit: Payer: Self-pay | Admitting: *Deleted

## 2014-08-18 ENCOUNTER — Other Ambulatory Visit: Payer: Self-pay

## 2014-08-18 ENCOUNTER — Ambulatory Visit (HOSPITAL_BASED_OUTPATIENT_CLINIC_OR_DEPARTMENT_OTHER): Payer: Medicare HMO | Admitting: Hematology

## 2014-08-18 VITALS — BP 133/103 | HR 82 | Temp 97.5°F | Resp 17 | Ht 66.0 in | Wt 132.8 lb

## 2014-08-18 DIAGNOSIS — C787 Secondary malignant neoplasm of liver and intrahepatic bile duct: Secondary | ICD-10-CM

## 2014-08-18 DIAGNOSIS — C801 Malignant (primary) neoplasm, unspecified: Secondary | ICD-10-CM

## 2014-08-18 DIAGNOSIS — D649 Anemia, unspecified: Secondary | ICD-10-CM

## 2014-08-18 DIAGNOSIS — E46 Unspecified protein-calorie malnutrition: Secondary | ICD-10-CM

## 2014-08-18 DIAGNOSIS — D72829 Elevated white blood cell count, unspecified: Secondary | ICD-10-CM

## 2014-08-18 DIAGNOSIS — R609 Edema, unspecified: Secondary | ICD-10-CM

## 2014-08-18 DIAGNOSIS — Z5111 Encounter for antineoplastic chemotherapy: Secondary | ICD-10-CM

## 2014-08-18 LAB — COMPREHENSIVE METABOLIC PANEL (CC13)
ALT: 8 U/L (ref 0–55)
AST: 13 U/L (ref 5–34)
Albumin: 1.9 g/dL — ABNORMAL LOW (ref 3.5–5.0)
Alkaline Phosphatase: 228 U/L — ABNORMAL HIGH (ref 40–150)
Anion Gap: 11 mEq/L (ref 3–11)
BUN: 19.1 mg/dL (ref 7.0–26.0)
CO2: 26 meq/L (ref 22–29)
Calcium: 9 mg/dL (ref 8.4–10.4)
Chloride: 106 mEq/L (ref 98–109)
Creatinine: 0.7 mg/dL (ref 0.6–1.1)
EGFR: >90 mL/min/{1.73_m2} (ref >90)
Glucose: 82 mg/dl (ref 70–140)
Potassium: 4.2 mEq/L (ref 3.5–5.1)
SODIUM: 143 meq/L (ref 136–145)
TOTAL PROTEIN: 5.5 g/dL — AB (ref 6.4–8.3)
Total Bilirubin: 0.65 mg/dL (ref 0.20–1.20)

## 2014-08-18 LAB — CBC WITH DIFFERENTIAL/PLATELET
BASO%: 0.1 % (ref 0.0–2.0)
BASOS ABS: 0 10*3/uL (ref 0.0–0.1)
EOS ABS: 0.2 10*3/uL (ref 0.0–0.5)
EOS%: 0.6 % (ref 0.0–7.0)
HCT: 36.8 % (ref 34.8–46.6)
HEMOGLOBIN: 10.7 g/dL — AB (ref 11.6–15.9)
LYMPH%: 5.2 % — AB (ref 14.0–49.7)
MCH: 23.3 pg — AB (ref 25.1–34.0)
MCHC: 29.1 g/dL — ABNORMAL LOW (ref 31.5–36.0)
MCV: 80.2 fL (ref 79.5–101.0)
MONO#: 2.5 10*3/uL — ABNORMAL HIGH (ref 0.1–0.9)
MONO%: 9.1 % (ref 0.0–14.0)
NEUT%: 85 % — AB (ref 38.4–76.8)
NEUTROS ABS: 23.3 10*3/uL — AB (ref 1.5–6.5)
PLATELETS: 298 10*3/uL (ref 145–400)
RBC: 4.59 10*6/uL (ref 3.70–5.45)
RDW: 20.2 % — AB (ref 11.2–14.5)
WBC: 27.4 10*3/uL — AB (ref 3.9–10.3)
lymph#: 1.4 10*3/uL (ref 0.9–3.3)
nRBC: 0 % (ref 0–0)

## 2014-08-18 LAB — HCG, QUANTITATIVE, PREGNANCY: hCG, Beta Chain, Quant, S: 41.2 m[IU]/mL

## 2014-08-18 LAB — CANCER ANTIGEN 19-9: CA 19-9: 57710.9 U/mL — ABNORMAL HIGH (ref <35.0)

## 2014-08-18 LAB — MORPHOLOGY: PLT EST: ADEQUATE

## 2014-08-18 MED ORDER — FAMOTIDINE IN NACL 20-0.9 MG/50ML-% IV SOLN
20.0000 mg | Freq: Once | INTRAVENOUS | Status: AC
Start: 2014-08-18 — End: 2014-08-18
  Administered 2014-08-18: 20 mg via INTRAVENOUS

## 2014-08-18 MED ORDER — ONDANSETRON 16 MG/50ML IVPB (CHCC)
16.0000 mg | Freq: Once | INTRAVENOUS | Status: AC
Start: 2014-08-18 — End: 2014-08-18
  Administered 2014-08-18: 16 mg via INTRAVENOUS

## 2014-08-18 MED ORDER — SODIUM CHLORIDE 0.9 % IV SOLN
517.5000 mg | Freq: Once | INTRAVENOUS | Status: AC
  Administered 2014-08-18: 520 mg via INTRAVENOUS
  Filled 2014-08-18: qty 52

## 2014-08-18 MED ORDER — MORPHINE SULFATE ER 15 MG PO TBCR: 15.0000 mg | EXTENDED_RELEASE_TABLET | Freq: Two times a day (BID) | ORAL | Status: DC

## 2014-08-18 MED ORDER — PACLITAXEL CHEMO INJECTION 300 MG/50ML
150.0000 mg/m2 | Freq: Once | INTRAVENOUS | Status: AC
  Administered 2014-08-18: 252 mg via INTRAVENOUS
  Filled 2014-08-18: qty 42

## 2014-08-18 MED ORDER — SODIUM CHLORIDE 0.9 % IV SOLN
Freq: Once | INTRAVENOUS | Status: AC
  Administered 2014-08-18: 11:00:00 via INTRAVENOUS

## 2014-08-18 MED ORDER — DEXAMETHASONE 4 MG PO TABS: ORAL_TABLET | ORAL | Status: DC

## 2014-08-18 MED ORDER — DIPHENHYDRAMINE HCL 50 MG/ML IJ SOLN
50.0000 mg | Freq: Once | INTRAMUSCULAR | Status: AC
  Administered 2014-08-18: 50 mg via INTRAVENOUS

## 2014-08-18 MED ORDER — DEXAMETHASONE SODIUM PHOSPHATE 20 MG/5ML IJ SOLN
20.0000 mg | Freq: Once | INTRAMUSCULAR | Status: AC
  Administered 2014-08-18: 20 mg via INTRAVENOUS

## 2014-08-18 NOTE — Progress Notes (Signed)
[facility] Psychosocial Distress Screening Clinical Social Work  Clinical Social Work was referred by distress screening protocol.  The patient scored a 6 on the Psychosocial Distress Thermometer which indicates moderate distress. Clinical Social Worker met with pt and her husband in the infusion room to assess for distress and other psychosocial needs. Pt very quiet and reserved with CSW and let her husband do most of the talking. CSW explained CSW role and discussed Pt and [facility] Support [facility] resources. Pt and husband report to have family near that are supportive. Pt's husband and her have both been on disability and not working since 2009, per their report. Pt receives assistance with her mental health care needs [facility] [place]. Pt appreciated visit and both pt and husband denied current concerns. CSW will continue to follow and will try to see Pt again in near future to reassess needs.   ONCBCN DISTRESS SCREENING 08/08/2014  Screening Type Initial Screening  Distress experienced in past week (1-10) 6  Emotional problem type Nervousness/Anxiety;Adjusting to illness;Adjusting to appearance changes  Spiritual/Religous concerns type Relating to God;Facing my mortality  Information Concerns Type Lack of info about diagnosis;Lack of info about treatment  Physical Problem type Pain;Nausea/vomiting;Sleep/insomnia;Getting around;Breathing;Mouth sores/swallowing;Loss of appetitie;Talking;Constipation/diarrhea;Skin dry/itchy;Swollen arms/legs;Other (comment)  Physician notified of physical symptoms Yes  Referral to clinical social work Yes  Referral to dietition Yes    Clinical Social Worker follow up needed: Yes.    If yes, follow up plan: See above [place] , LCSW Clinical Social Worker  [facility]  [facility] Phone: [phone] Fax: [phone]     

## 2014-08-18 NOTE — Patient Instructions (Signed)
[facility] Discharge Instructions for Patients Receiving Chemotherapy  Today you received the following chemotherapy agents Taxol/Carboplatin To help prevent nausea and vomiting after your treatment, we encourage you to take your nausea medication as prescribed.  If you develop nausea and vomiting that is not controlled by your nausea medication, call the clinic.   BELOW ARE SYMPTOMS THAT SHOULD BE REPORTED IMMEDIATELY:  *FEVER GREATER THAN 100.5 F  *CHILLS WITH OR WITHOUT FEVER  NAUSEA AND VOMITING THAT IS NOT CONTROLLED WITH YOUR NAUSEA MEDICATION  *UNUSUAL SHORTNESS OF BREATH  *UNUSUAL BRUISING OR BLEEDING  TENDERNESS IN MOUTH AND THROAT WITH OR WITHOUT PRESENCE OF ULCERS  *URINARY PROBLEMS  *BOWEL PROBLEMS  UNUSUAL RASH Items with * indicate a potential emergency and should be followed up as soon as possible.  Feel free to call the clinic you have any questions or concerns. The clinic phone number is [phone].    

## 2014-08-18 NOTE — Telephone Encounter (Signed)
Gave avs & cal for Dec. °

## 2014-08-18 NOTE — Progress Notes (Signed)
53 year old female with metastatic cancer to the liver with unknown primary.  Past medical history includes bipolar disease and hypothyroidism.  Medications include Wellbutrin, lactulose, Synthroid, Zofran, risperidone.  Labs include albumin 2.3 on November 17.  Height: 66 inches. Weight: 132.8 pounds.  This reflects edema and ascites.  Patient reports poor appetite and early satiety.   She has tried ensure and boost but does not care for them.   Dietary recall reveals patient consumed one donut and a small salad yesterday.   She reports she drinks water milk and orange juice.   She has taste alterations.  Nutrition diagnosis: Food and nutrition related knowledge deficit related to diagnosis of metastatic cancer as evidenced by no prior need for nutrition related information.  Intervention: Patient was educated to consume smaller, more frequent meals and snacks throughout the day. Educated patient on sources of high-calorie, high-protein foods. Provided patient with a variety of oral nutrition supplements for patient to try.  I've also given her ideas for recipes using oral nutrition supplements. Provided fact sheets on increasing calories and protein, taste alterations, and nausea, vomiting. Questions were answered.  Teach back method used.  Contact information was given.  Monitoring, evaluation, goals: Patient will tolerate small amounts of nutrition throughout the day. Patient will work to increase protein foods.  Next visit:patient will contact me for questions or concerns.  **Disclaimer: This note was dictated with voice recognition software. Similar sounding words can inadvertently be transcribed and this note may contain transcription errors which may not have been corrected upon publication of note.**

## 2014-08-19 ENCOUNTER — Encounter: Payer: Self-pay | Admitting: Hematology

## 2014-08-19 LAB — CA 125: CA 125: 946 U/mL — AB (ref <35)

## 2014-08-19 LAB — CA 125(PREVIOUS METHOD): CA 125: 833.4 U/mL — ABNORMAL HIGH (ref 0.0–30.2)

## 2014-08-19 NOTE — Progress Notes (Signed)
Consult Note: Gyn-Onc  Consult was requested by Dr. Burr Medico  for the evaluation of [name] 53 y.o. female  CC:  Chief Complaint  Patient presents with  . adenocarcinoma    Assessment/Plan:  [name]  is a 53 y.o.   With very poor performance status an evidence of metastatic malignancy of unclear primary.  Carboplatin/paclitaxel chemotherapy will begin 08/18/2014.  The patient denies vaginal bleeding , the adnexa are not clearly visualized .  The presentation is complicated by HCG over the past fes weels from 26.1-41.2.  Differential diagnosis includes missed abortion and less likely an ectopic pregnancy or molar pregnancy.  The pathology is not c/w a germ cell malignancy.  Dr. Delsa Sale will perform uterine D&C to evalute the uterus for villi or malignancy.  Follow-up in 2 months HPI: [name]  is a 53 y.o.  G3P3 LMP 4 months ago.  In September 2015 she noted low energy and the onset of RLQ pain.  She subsequently had a fall and was evaluated by her PCP when anemia was noted.  She developed nausea vomiting and low urine output so her family encouraged her to present to the emergency room.  CT 08/07/2014 Upper abdomen: Multiple hepatic lesions are again noted, measuring up to 9.4 cm posteriorly in the right lobe. The left adrenal gland is incompletely visualized. The right adrenal gland appears normal. There is a small amount of ascites within the upper abdomen.  Musculoskeletal/Chest wall: There is no axillary adenopathy or chest wall mass. No suspicious osseous lesions. There is degenerative disc disease at T10-11 with endplate sclerosis.  IMPRESSION: 1. No evidence of primary malignancy or definite metastatic disease within the chest. 2. Right-greater-than-left pleural effusions with associated bibasilar atelectasis. Small moderate pericardial effusion. 3. Cannot exclude left supraclavicular adenopathy. 4. Widespread hepatic metastatic disease    Results for Anne, Hill (MRN [id number]) as of 08/19/2014 23:33  Ref. Range 07/25/2014 09:20 08/01/2014 13:28 08/18/2014 13:52 08/18/2014 13:52 08/18/2014 13:53  AFP Tumor Marker Latest Range: <6.1 ng/mL 1.9      CA 19-9 Latest Range: <35.0 U/mL     57710.9 (H)  CA 125 Latest Range: <35 U/mL 843 (H)  946 (H) 833.4 (H)   CEA Latest Range: 0.0-5.0 ng/mL  23.2 (H)      Biopsy of the metastatic disease to the liver 08/03/2014 - ADENOCARCINOMA. Microscopic Comment The core biopsies are extensively involved by adenocarcinoma with focal goblet cells and foci with signet ring morphology. Immunohistochemistry will be performed and reported as an addendum. (JDP:gt, 08/04/14) Addendum: Immunohistochemistry is performed and the tumor is positive with Cytokeratin 7 and negative with Cytokeratin 20, CDX2, thyroid transcription factor-1, Napsin A, estrogen receptor, progesterone receptor and gross cystic disease fluid protein. The immunophenotype is nonspecific.  Plan is for cycle 1 carboplatin/taxane therapy 08/18/2014 for malignancy of unclear primary    Ref. Range 08/01/2014 13:28 08/07/2014 14:45 08/18/2014 13:52  hCG, Beta Chain, Quant, S No range found 26.1 31.3 41.2   Patient denies vaginal bleeding.  Last sexual intercourse 5 months ago. Pelvic UTZ 07/2014 FINDINGS: Intrauterine gestational sac: No intrauterine gestational sac is identified.  Yolk sac: Not identified.  Embryo: Not identified.  Cardiac Activity: Not identified.  Heart Rate: bpm  Maternal uterus/adnexae: There is a large amount of free fluid demonstrated in the pelvis with floating bowel loops. Fluid appears to be simple without significant debris. Uterus is anteverted. Heterogeneous myometrium with focal anterior fibroid measuring about 1.3 cm maximal  diameter. Endometrium is not separately distinguished from the heterogeneous myometrium. Ovaries are not identified.  IMPRESSION: Large amount of free fluid in  the pelvis. No intrauterine gestational sac, yolk sac, or fetal pole identified. Differential considerations include intrauterine pregnancy too early to be sonographically visualized, missed abortion, or ectopic pregnancy. Followup ultrasound is recommended in 10-14 days for further evaluation.   Review of Systems:  Constitutional  Feels poorly.  Severe fatigue. Cardiovascular  No chest pain, shortness of breath, or edema  Pulmonary  No cough or wheeze.  Gastro Intestinal  No nausea, vomitting, or diarrhoea. No bright red blood per rectum, vague continuous  abdominal pain, reports severe constipation.  Is not compliant with the constipation recommendations, denies jaundice.  Very poor appetite, early satiety, 20 pound weight loss in the past few weeks.   Genito Urinary  No frequency, urgency, dysuria, no vaginal bleeding or cramping.   Musculo Skeletal  No myalgia, arthralgia, joint swelling or pain  Neurologic  Generalized  weakness, no numbness, change in gait,  Psychology  Reports  depression,   Current Meds:  Outpatient Encounter Prescriptions as of 08/17/2014  Medication Sig  . benztropine (COGENTIN) 1 MG tablet Take 1 tablet (1 mg total) by mouth daily. (Patient taking differently: Take 1 mg by mouth at bedtime. )  . buPROPion (WELLBUTRIN XL) 300 MG 24 hr tablet Take 1 tablet (300 mg total) by mouth daily.  Marland Kitchen lactulose (CHRONULAC) 10 GM/15ML solution 15 ml q 4 hr as needed for constipation.  Max of 60 ml per day (Patient taking differently: 10 g. 30 ml q 4 hr as needed for constipation.  Max of 60 ml per day)  . levothyroxine (SYNTHROID, LEVOTHROID) 88 MCG tablet Take 88 mcg by mouth at bedtime.   . ondansetron (ZOFRAN) 4 MG tablet Take 1 tablet (4 mg total) by mouth every 6 (six) hours as needed for nausea or vomiting.  Marland Kitchen oxyCODONE (OXY IR/ROXICODONE) 5 MG immediate release tablet Take 1 tablet (5 mg total) by mouth every 4 (four) hours as needed for severe pain.  . OxyCODONE  (OXYCONTIN) 10 mg T12A 12 hr tablet Take 1 tablet (10 mg total) by mouth every 12 (twelve) hours.  . propranolol (INDERAL) 10 MG tablet Take 1 tablet (10 mg total) by mouth 3 (three) times daily.  . risperiDONE (RISPERDAL) 3 MG tablet Take 1 tablet (3 mg total) by mouth at bedtime.  . butalbital-acetaminophen-caffeine (ESGIC PLUS) 50-500-40 MG per tablet Take 1 tablet by mouth as needed for headache.   . zolpidem (AMBIEN) 10 MG tablet Take 1 tablet (10 mg total) by mouth at bedtime as needed for sleep. (Patient not taking: Reported on 08/17/2014)  . [DISCONTINUED] acetaminophen-codeine (TYLENOL #3) 300-30 MG per tablet Take 1 tablet by mouth as needed (pain).   . [DISCONTINUED] oxyCODONE-acetaminophen (PERCOCET/ROXICET) 5-325 MG per tablet     Allergy:  Allergies  Allergen Reactions  . Buspar [Buspirone] Other (See Comments)    More irritable and "goes off the handle"  . Cymbalta [Duloxetine Hcl] Other (See Comments)    Worked against her, husband can't remember exactly how  . Hydroxyzine Other (See Comments)    Doesn't work that well and causes constipation.  . Sertraline Other (See Comments)    Worked against her, but husband can not remember how so    Social Hx:   History   Social History  . Marital Status: Married    Spouse Name: N/A    Number of Children: N/A  .  Years of Education: N/A   Occupational History  . Not on file.   Social History Main Topics  . Smoking status: Former Smoker    Types: Cigarettes    Quit date: 09/15/1989  . Smokeless tobacco: Never Used  . Alcohol Use: No  . Drug Use: No  . Sexual Activity: Not Currently    Birth Control/ Protection: Surgical     Comment: Last Encounter a few months ago   Other Topics Concern  . Not on file   Social History Narrative  Not able to perform most ADL.  Has no cooked in months, does no answer the phone.  Spends more than 75% of the day in bed.  Past Surgical Hx:  Past Surgical History  Procedure Laterality  Date  . Tubal ligation      Past Medical Hx:  Past Medical History  Diagnosis Date  . Mania   . Depression   . Thyroid disease   . Headache(784.0)   . Bipolar disorder   Mammogram 06/2014 wnl Colonoscopy 2015 neg   Past Gynecological History:  G3P3 LMP 4 months ago.  Last sevual activity 5 months ago.  Menarche at 53 y/o regular menses.  OCP use for 20 years.  No h/o abnormal pap No LMP recorded. Patient is not currently having periods (Reason: Perimenopausal).  Family Hx:  Family History  Problem Relation Age of Onset  . OCD Mother   . Dementia Father   . Bipolar disorder Sister   . Drug abuse Sister   . Alcohol abuse Sister   . Anxiety disorder Neg Hx   . Depression Neg Hx   . Paranoid behavior Neg Hx   . Schizophrenia Neg Hx   . Seizures Neg Hx   . Sexual abuse Neg Hx   . Physical abuse Neg Hx   . ADD / ADHD Other     Vitals:  Blood pressure 135/93, pulse 96, temperature 97.8 F (36.6 C), temperature source Oral, resp. rate 18, height 5\' 6"  (1.676 m), weight 131 lb 11.2 oz (59.739 kg).  Physical Exam: WD in NAD Neck  Supple NROM, without any enlargements.  Lymph Node Survey No cervical supraclavicular or inguinal adenopathy Cardiovascular  Pulse normal rate, regularity and rhythm. S1 and S2 normal.  Lungs  Clear to auscultation bilaterally,Good air movement.  Skin  No rash/lesions/breakdown  Psychiatry  Alert and oriented flat mood and affect speech, normal reasoning. Abdomen  Normoactive bowel sounds, abdomen soft, distended.  Liver and spleen are palpable.  Liver extends to the pelvis.  Examination c/w ascites Back No CVA tenderness Genito Urinary  Vulva/vagina: Normal external female genitalia.  No lesions. No discharge or bleeding.  Bladder/urethra:  No lesions or masses  Vagina:no masses atrophic Cervix: Normal appearing, no lesions.  Uterus: Mobile, no parametrial involvement or nodularity.  Adnexa: No palpable masses. Rectal  Good tone, no  masses no cul de sac nodularity.  Extremities  No bilateral cyanosis or  clubbing 3+edema. Bilaterally to the knee   Janie Morning, MD, PhD

## 2014-08-19 NOTE — Progress Notes (Signed)
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 Hepatosplenomegaly. Abdominal and pelvic ascites and Pathologically enlarged upper abdominal. CT chest (-).    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 Bipolar 2. Hypothyroidism   INTERVAL HISTORY: [name] returns for follow-up and initiating the first cycle chemotherapy. No significant change since last visit. She still has moderate fatigue, which did not improve after 2 units of blood transfusion. Her nausea and pain are controlled by medication. She still constipated, on laxative. No fever chills, productive cough or other new symptoms.   REVIEW OF SYSTEMS:   Constitutional: Denies fevers, chills, (+) weight loss and very fatigued  Eyes: Denies blurriness of vision Ears, nose, mouth, throat, and face: Denies mucositis or sore throat Respiratory: Denies cough, dyspnea or wheezes Cardiovascular: Denies palpitation, chest discomfort or lower extremity swelling Gastrointestinal:  (+) nausea better with Zofran, no heartburn or change in bowel habits Skin: Denies abnormal skin rashes. (+) b/l leg swelling  Lymphatics: Denies new lymphadenopathy or easy bruising Neurological:Denies numbness, tingling or new weaknesses Behavioral/Psych: Mood is stable, no new changes  All other systems were reviewed with the patient and are negative.  I have reviewed the past medical history, past surgical history, social history and family history with the patient and they are unchanged from previous note.  ALLERGIES:  is  allergic to buspar; cymbalta; hydroxyzine; and sertraline.  MEDICATIONS:  Current Outpatient Prescriptions  Medication Sig Dispense Refill  . benztropine (COGENTIN) 1 MG tablet Take 1 tablet (1 mg total) by mouth daily. (Patient taking differently: Take 1 mg by mouth at bedtime. ) 90 tablet 2  . buPROPion (WELLBUTRIN XL) 300 MG 24 hr tablet Take 1 tablet (300 mg total) by mouth daily. 90 tablet 2  . butalbital-acetaminophen-caffeine (ESGIC PLUS) 50-500-40 MG per tablet Take 1 tablet by mouth as needed for headache.     . dexamethasone (DECADRON) 4 MG tablet 1 pill twice a day for 4 days only.  Take with food. 8 tablet 3  . lactulose (CHRONULAC) 10 GM/15ML solution 15 ml q 4 hr as needed for constipation.  Max of 60 ml per day (Patient taking differently: 10 g. 30 ml q 4 hr as needed for constipation.  Max of 60 ml per day) 500 mL 2  . levothyroxine (SYNTHROID, LEVOTHROID) 88 MCG tablet Take 88 mcg by mouth at bedtime.     Marland Kitchen morphine (MS CONTIN) 15 MG 12 hr tablet Take 1 tablet (15 mg total) by mouth every 12 (twelve) hours. 60 tablet 0  . ondansetron (ZOFRAN) 4 MG tablet Take 1 tablet (4 mg total) by mouth every 6 (six) hours as needed for nausea or vomiting. 60 tablet 4  . oxyCODONE (OXY IR/ROXICODONE) 5 MG immediate release tablet Take 1 tablet (5 mg total) by mouth every 4 (four) hours as needed for severe pain. 90 tablet 0  . OxyCODONE (OXYCONTIN) 10 mg T12A 12 hr tablet Take 1 tablet (10 mg total) by mouth every 12 (twelve) hours. 60 tablet 0  . propranolol (INDERAL) 10 MG tablet Take 1 tablet (10 mg total) by mouth  3 (three) times daily. 270 tablet 2  . risperiDONE (RISPERDAL) 3 MG tablet Take 1 tablet (3 mg total) by mouth at bedtime. 90 tablet 2  . zolpidem (AMBIEN) 10 MG tablet Take 1 tablet (10 mg total) by mouth at bedtime as needed for sleep. (Patient not taking: Reported on 08/17/2014) 90 tablet 1   No current facility-administered medications for this visit.    PHYSICAL  EXAMINATION: ECOG PERFORMANCE STATUS: 2   Filed Vitals:   08/18/14 0904  BP: 133/103  Pulse: 82  Temp: 97.5 F (36.4 C)  Resp: 17   Filed Weights   08/18/14 0904  Weight: 132 lb 12.8 oz (60.238 kg)    GENERAL:alert, no distress and comfortable SKIN: skin color, texture, turgor are normal, no rashes or significant lesions EYES: normal, Conjunctiva are pink and non-injected, sclera clear OROPHARYNX:no exudate, no erythema and lips, buccal mucosa, and tongue normal  NECK: supple, thyroid normal size, non-tender, without nodularity LYMPH:  no palpable lymphadenopathy in the cervical, axillary or inguinal LUNGS: clear to auscultation and percussion with normal breathing effort HEART: regular rate & rhythm and no murmurs and no lower extremity edema ABDOMEN:abdomen soft, non-tender and normal bowel sounds. (+) distended, (+) hepatomegaly, firm, palpable 10cm under rib cage  Musculoskeletal:no cyanosis of digits and no clubbing  NEURO: alert & oriented x 3 with fluent speech, no focal motor/sensory deficits  LABORATORY DATA:  I have reviewed the data as listed  CBC Latest Ref Rng 08/18/2014 08/09/2014 08/03/2014  WBC 3.9 - 10.3 10e3/uL 27.4(H) 21.7(H) 18.7(H)  Hemoglobin 11.6 - 15.9 g/dL 10.7(L) 7.9(L) 8.2(L)  Hematocrit 34.8 - 46.6 % 36.8 27.1(L) 28.7(L)  Platelets 145 - 400 10e3/uL 298 332 383    CMP Latest Ref Rng 08/18/2014 08/01/2014 07/25/2014  Glucose 70 - 140 mg/dl 82 90 96  BUN 7.0 - 26.0 mg/dL 19.1 11.1 23  Creatinine 0.6 - 1.1 mg/dL 0.7 0.7 0.99  Sodium 136 - 145 mEq/L 143 141 142  Potassium 3.5 - 5.1 mEq/L 4.2 4.2 4.4  Chloride 96 - 112 mEq/L - - 104  CO2 22 - 29 mEq/L 26 26 24   Calcium 8.4 - 10.4 mg/dL 9.0 8.9 8.7  Total Protein 6.4 - 8.3 g/dL 5.5(L) 5.9(L) -  Total Bilirubin 0.20 - 1.20 mg/dL 0.65 0.84 -  Alkaline Phos 40 - 150 U/L 228(H) 190(H) -  AST 5 - 34 U/L 13 13 -  ALT 0 - 55 U/L 8 7 -      Surgical path 08/03/14 - ADENOCARCINOMA. Microscopic  Comment The core biopsies are extensively involved by adenocarcinoma with focal goblet cells and foci with signet ring morphology. Immunohistochemistry will be performed and reported as an addendum. (JDP:gt, 08/04/14) Addendum: Immunohistochemistry is performed and the tumor is positive with Cytokeratin 7 and negative with Cytokeratin 20, CDX2, thyroid transcription factor-1, Napsin A, estrogen receptor, progesterone receptor and gross cystic disease fluid protein. The immunophenotype is nonspecific.  RADIOGRAPHIC STUDIES: I have personally reviewed the radiological images as listed and agreed with the findings in the report.  I have reviewed her EGD and colonoscopy reports which was done on 11/20, which was negative for ulceration or mass.   ASSESSMENT & PLAN:  53 yo female with PMH of bipolar but otherwise healthy, no history of liver disease or alcohol abuse, no history of blood transfusion, who presented with significant N/V, abdominal pain and bloating, New discovered multiple liver masses and small ascites, and elevated serum beta HCG, CA125 and CEA.   1. Metastatic  adenocarcinoma to liver, unknown primary. Differential including gynecological (endometrial or ovarian cancer), cholangiocarcinoma, or occluded pancreatic cancer. -The patient and her family understand the overall poor prognosis, and his treatment goal is palliation. - Her case was reviewed in our GI tumor Board this week. The radiologist feels her liver lesions are most consistent with metastatic lesions, not typical for cholangiocarcinoma. -I asked pathologist Dr. Lynnell Chad to review the case for a second opinion, she favors cholangiocarcinoma or pancreatic cancer. I will ask an additional immunostain marker WT 1 to ruled out only orange and. She was also seen by GYN oncologist Dr. Skeet Latch a few days ago and will have endometrial biopsy early next week. CA-19-9 is still pending. -I discussed the role of tumor genetic  testing, such as [facility], to see if she has any actionable Gene mutations in her tumor. Her family agrees to proceed. Potential out-of-pocket cost was discussed.  -The lab are adequate for treatment. Chemotherapy consent obtained. She and her daughter participated the chemotherapy class. Due to her limited performance status, I'll reduce dose of carboplatin to AUC 5 and paclitaxel 1 50 mg/m. First cycle today.  2. Anemia, probably related to her underline malignancy.  -Improved after blood transfusion  3. Leukocytosis -No fever or chills, no signs of infection. This could be related to reaction to her underlying malignancy, although it is unusually high. We'll review her peripheral blood smear.  3. Bilateral leg edema -I checked Doppler ultrasound on her legs which showed no evidence of DVT. -this is likely related to her hypoalbuminemia, and the poor venous return. I encouraged her to elevate her legs and the use compression stocks. I would not use Lasix due to her poor by mouth intake.  4. Elevate beta hCG -she was seen by OB/GYN Dr. Laurance Flatten and will follow up.   5. Malnutrition and deconditioning secondary to her probably underline malignancy  -I encouraged her to take nutrition supplement, and the [facility] dietitian here.  I will see her back next week  for toxicity check up.   Orders Placed This Encounter  Procedures  . CBC with Differential    With each visit With each visit, stat     Standing Status: Standing     Number of Occurrences: 20     Standing Expiration Date: 08/18/2015  . Comprehensive metabolic panel (Cmet) - [facility]    With each visit With each visit, stat     Standing Status: Standing     Number of Occurrences: 20     Standing Expiration Date: 08/18/2015  . CA 19.9    Standing Status: Future     Number of Occurrences: 1     Standing Expiration Date: 08/17/2015   All questions were answered. The patient knows to call the clinic with any problems, questions or  concerns. No barriers to learning was detected. I spent 40 minutes counseling the patient face to face. The total time spent in the appointment was 50 minutes and more than 50% was on counseling and review of test results     [name], MD 08/18/2014 9:02 AM

## 2014-08-21 ENCOUNTER — Ambulatory Visit (HOSPITAL_COMMUNITY): Payer: Medicare HMO | Admitting: Certified Registered Nurse Anesthetist

## 2014-08-21 ENCOUNTER — Ambulatory Visit (HOSPITAL_COMMUNITY)
Admission: RE | Admit: 2014-08-21 | Discharge: 2014-08-21 | Disposition: A | Discharge status: Home / Self Care | Payer: Medicare HMO | Source: Ambulatory Visit | Attending: Obstetrics & Gynecology | Admitting: Obstetrics & Gynecology

## 2014-08-21 ENCOUNTER — Encounter (HOSPITAL_COMMUNITY)
Admission: RE | Disposition: A | Discharge status: Home / Self Care | Payer: Self-pay | Source: Ambulatory Visit | Attending: Obstetrics & Gynecology

## 2014-08-21 DIAGNOSIS — Z87891 Personal history of nicotine dependence: Secondary | ICD-10-CM | POA: Diagnosis not present

## 2014-08-21 DIAGNOSIS — N882 Stricture and stenosis of cervix uteri: Secondary | ICD-10-CM | POA: Insufficient documentation

## 2014-08-21 DIAGNOSIS — C801 Malignant (primary) neoplasm, unspecified: Secondary | ICD-10-CM | POA: Insufficient documentation

## 2014-08-21 DIAGNOSIS — E349 Endocrine disorder, unspecified: Secondary | ICD-10-CM | POA: Diagnosis present

## 2014-08-21 DIAGNOSIS — R7989 Other specified abnormal findings of blood chemistry: Secondary | ICD-10-CM | POA: Diagnosis present

## 2014-08-21 DIAGNOSIS — O021 Missed abortion: Secondary | ICD-10-CM | POA: Diagnosis not present

## 2014-08-21 DIAGNOSIS — E079 Disorder of thyroid, unspecified: Secondary | ICD-10-CM | POA: Diagnosis not present

## 2014-08-21 DIAGNOSIS — C787 Secondary malignant neoplasm of liver and intrahepatic bile duct: Secondary | ICD-10-CM | POA: Insufficient documentation

## 2014-08-21 DIAGNOSIS — F319 Bipolar disorder, unspecified: Secondary | ICD-10-CM | POA: Insufficient documentation

## 2014-08-21 DIAGNOSIS — F339 Major depressive disorder, recurrent, unspecified: Secondary | ICD-10-CM | POA: Diagnosis not present

## 2014-08-21 DIAGNOSIS — D649 Anemia, unspecified: Secondary | ICD-10-CM | POA: Insufficient documentation

## 2014-08-21 DIAGNOSIS — E039 Hypothyroidism, unspecified: Secondary | ICD-10-CM | POA: Insufficient documentation

## 2014-08-21 HISTORY — PX: DILATION AND EVACUATION: SHX1459

## 2014-08-21 SURGERY — DILATION AND EVACUATION, UTERUS
Anesthesia: Monitor Anesthesia Care | Site: Vagina

## 2014-08-21 MED ORDER — LIDOCAINE HCL 1 % IJ SOLN
INTRAMUSCULAR | Status: AC
  Filled 2014-08-21: qty 20

## 2014-08-21 MED ORDER — DEXAMETHASONE SODIUM PHOSPHATE 4 MG/ML IJ SOLN
INTRAMUSCULAR | Status: AC
  Filled 2014-08-21: qty 1

## 2014-08-21 MED ORDER — LIDOCAINE HCL (CARDIAC) 20 MG/ML IV SOLN
INTRAVENOUS | Status: AC
  Filled 2014-08-21: qty 5

## 2014-08-21 MED ORDER — LIDOCAINE HCL (CARDIAC) 20 MG/ML IV SOLN
INTRAVENOUS | Status: DC | PRN
  Administered 2014-08-21: 40 mg via INTRAVENOUS

## 2014-08-21 MED ORDER — FENTANYL CITRATE 0.05 MG/ML IJ SOLN: 25.0000 ug | INTRAMUSCULAR | Status: DC | PRN

## 2014-08-21 MED ORDER — FENTANYL CITRATE 0.05 MG/ML IJ SOLN
INTRAMUSCULAR | Status: DC | PRN
  Administered 2014-08-21: 50 ug via INTRAVENOUS

## 2014-08-21 MED ORDER — KETOROLAC TROMETHAMINE 30 MG/ML IJ SOLN
INTRAMUSCULAR | Status: AC
  Filled 2014-08-21: qty 1

## 2014-08-21 MED ORDER — PROMETHAZINE HCL 25 MG/ML IJ SOLN: 6.2500 mg | INTRAMUSCULAR | Status: DC | PRN

## 2014-08-21 MED ORDER — PROPOFOL INFUSION 10 MG/ML OPTIME
INTRAVENOUS | Status: DC | PRN
  Administered 2014-08-21: 50 ug/kg/min via INTRAVENOUS

## 2014-08-21 MED ORDER — MIDAZOLAM HCL 2 MG/2ML IJ SOLN
INTRAMUSCULAR | Status: DC | PRN
  Administered 2014-08-21: 1 mg via INTRAVENOUS

## 2014-08-21 MED ORDER — SCOPOLAMINE 1 MG/3DAYS TD PT72
MEDICATED_PATCH | TRANSDERMAL | Status: AC
  Filled 2014-08-21: qty 1

## 2014-08-21 MED ORDER — PROPOFOL 10 MG/ML IV EMUL
INTRAVENOUS | Status: AC
  Filled 2014-08-21: qty 20

## 2014-08-21 MED ORDER — KETOROLAC TROMETHAMINE 30 MG/ML IJ SOLN
INTRAMUSCULAR | Status: DC | PRN
  Administered 2014-08-21: 30 mg via INTRAVENOUS

## 2014-08-21 MED ORDER — SCOPOLAMINE 1 MG/3DAYS TD PT72
1.0000 | MEDICATED_PATCH | Freq: Once | TRANSDERMAL | Status: AC
  Administered 2014-08-21: 1.5 mg via TRANSDERMAL
  Administered 2014-08-21: 1 via TRANSDERMAL

## 2014-08-21 MED ORDER — LACTATED RINGERS IV SOLN
INTRAVENOUS | Status: DC
  Administered 2014-08-21 (×2): via INTRAVENOUS

## 2014-08-21 MED ORDER — ONDANSETRON HCL 4 MG/2ML IJ SOLN
INTRAMUSCULAR | Status: DC | PRN
  Administered 2014-08-21: 4 mg via INTRAVENOUS

## 2014-08-21 MED ORDER — DEXAMETHASONE SODIUM PHOSPHATE 4 MG/ML IJ SOLN
INTRAMUSCULAR | Status: DC | PRN
  Administered 2014-08-21: 4 mg via INTRAVENOUS

## 2014-08-21 MED ORDER — LIDOCAINE HCL 1 % IJ SOLN
INTRAMUSCULAR | Status: DC | PRN
  Administered 2014-08-21: 10 mL

## 2014-08-21 MED ORDER — MIDAZOLAM HCL 2 MG/2ML IJ SOLN
INTRAMUSCULAR | Status: AC
  Filled 2014-08-21: qty 2

## 2014-08-21 MED ORDER — ONDANSETRON HCL 4 MG/2ML IJ SOLN
INTRAMUSCULAR | Status: AC
  Filled 2014-08-21: qty 2

## 2014-08-21 MED ORDER — FENTANYL CITRATE 0.05 MG/ML IJ SOLN
INTRAMUSCULAR | Status: AC
  Filled 2014-08-21: qty 2

## 2014-08-21 SURGICAL SUPPLY — 19 items
CATH ROBINSON RED A/P 16FR (CATHETERS) ×3 IMPLANT
CLOTH BEACON ORANGE TIMEOUT ST (SAFETY) ×3 IMPLANT
DECANTER SPIKE VIAL GLASS SM (MISCELLANEOUS) ×3 IMPLANT
GLOVE BIO SURGEON STRL SZ 6.5 (GLOVE) ×2 IMPLANT
GLOVE BIO SURGEONS STRL SZ 6.5 (GLOVE) ×1
GOWN STRL REUS W/TWL LRG LVL3 (GOWN DISPOSABLE) ×6 IMPLANT
KIT BERKELEY 1ST TRIMESTER 3/8 (MISCELLANEOUS) ×3 IMPLANT
NS IRRIG 1000ML POUR BTL (IV SOLUTION) ×3 IMPLANT
PACK VAGINAL MINOR WOMEN LF (CUSTOM PROCEDURE TRAY) ×3 IMPLANT
PAD OB MATERNITY 4.3X12.25 (PERSONAL CARE ITEMS) ×3 IMPLANT
PAD PREP 24X48 CUFFED NSTRL (MISCELLANEOUS) ×3 IMPLANT
SCRUB PCMX 4 OZ (MISCELLANEOUS) ×3 IMPLANT
SET BERKELEY SUCTION TUBING (SUCTIONS) ×3 IMPLANT
TOWEL OR 17X24 6PK STRL BLUE (TOWEL DISPOSABLE) ×6 IMPLANT
VACURETTE 10 RIGID CVD (CANNULA) IMPLANT
VACURETTE 6 ASPIR F TIP BERK (CANNULA) ×4 IMPLANT
VACURETTE 7MM CVD STRL WRAP (CANNULA) IMPLANT
VACURETTE 8 RIGID CVD (CANNULA) IMPLANT
VACURETTE 9 RIGID CVD (CANNULA) IMPLANT

## 2014-08-21 NOTE — Anesthesia Postprocedure Evaluation (Signed)
  Anesthesia Post-op Note  Anesthesia Post Note  Patient: [name]  Procedure(s) Performed: Procedure(s) (LRB): DILATATION AND EVACUATION (N/A)  Anesthesia type: MAC  Patient location: PACU  Post pain: Pain level controlled  Post assessment: Post-op Vital signs reviewed  Last Vitals:  Filed Vitals:   08/21/14 1210  BP: 127/92  Pulse: 89  Temp: 36.4 C  Resp: 16    Post vital signs: Reviewed  Level of consciousness: sedated  Complications: No apparent anesthesia complications

## 2014-08-21 NOTE — Op Note (Signed)
Preoperative diagnosis: Early pregnancy failure, pregnancy of unknown location  Postoperative diagnosis: Same  Procedure: Suction dilatation and curretage Surgeon: Lahoma Crocker A  Anesthesia: Managed anesthesia care, paracervical block  Estimated blood loss:  Minimal   Urine output: per Anesthesiology  IV Fluids: per Anesthesiology  Complications: None  Specimen: PATHOLOGY  Operative Findings: The uterus was small, retroverted.  The internal cervical os was stenotic.  There was very scant tissue retrieved.  Description of procedure:   The patient was taken to the operating room and placed on the operating table in the semi-lithotomy position in [place].  Examination under anesthesia was performed.  The patient was prepped and draped in the usual manner.  After a time-out had been completed, a speculum was placed in the vagina.  The anterior lip of the cervix was grasped with a single-toothed tenaculum.  A paracervical block was performed using 10 ml of 1% lidocaine.  The block was performed at 4 and 8 o'clock at the cervical vaginal junction. The cervix was dilated with Kennon Rounds dilators.  A 6 -mm suction curet was inserted in the uterine cavity.  The device was activated and the curet rotated to evacuate the products of conception.   All the instruments were removed from the vagina.  Final instrument counts were correct.  The patient was taken to the PACU in stable condition.

## 2014-08-21 NOTE — Anesthesia Preprocedure Evaluation (Signed)
Anesthesia Evaluation  Patient identified by MRN, date of birth, ID band Patient awake    Reviewed: Allergy & Precautions, H&P , NPO status , Patient's Chart, lab work & pertinent test results, reviewed documented beta blocker date and time   History of Anesthesia Complications Negative for: history of anesthetic complications  Airway Mallampati: II  TM Distance: >3 FB Neck ROM: full    Dental  (+) Teeth Intact   Pulmonary shortness of breath (due to ascites and abdominal distension), former smoker,  breath sounds clear to auscultation  Pulmonary exam normal       Cardiovascular Exercise Tolerance: Poor Rhythm:regular Rate:Normal     Neuro/Psych  Headaches, Anxiety Depression Bipolar Disorder    GI/Hepatic negative GI ROS, Liver metastasis - 12/4 was first round of chemotherapy   Endo/Other  Hypothyroidism   Renal/GU negative Renal ROS  Female GU complaint (unknown primary cancer - possibly ovarian or pancreatic origin.  elevated HCG)     Musculoskeletal   Abdominal   Peds  Hematology negative hematology ROS (+) anemia ,   Anesthesia Other Findings   Reproductive/Obstetrics (+) Pregnancy (possible missed ab? recent metastatic cancer diagnosis with elevated HCG)                             Anesthesia Physical Anesthesia Plan  ASA: III  Anesthesia Plan: MAC   Post-op Pain Management:    Induction:   Airway Management Planned:   Additional Equipment:   Intra-op Plan:   Post-operative Plan:   Informed Consent: I have reviewed the patients History and Physical, chart, labs and discussed the procedure including the risks, benefits and alternatives for the proposed anesthesia with the patient or authorized representative who has indicated his/her understanding and acceptance.   Dental Advisory Given  Plan Discussed with: CRNA and Surgeon  Anesthesia Plan Comments:          Anesthesia Quick Evaluation

## 2014-08-21 NOTE — Discharge Instructions (Signed)
DISCHARGE INSTRUCTIONS: D&C / D&E The following instructions have been prepared to help you care for yourself upon your return home.  May remove Scop patch on or before 08/23/2014.  May take Ibuprofen after 4:36 p.m. As needed for cramps.   Personal hygiene:  Use sanitary pads for vaginal drainage, not tampons.  Shower the day after your procedure.  NO tub baths, pools or Jacuzzis for 2-3 weeks.  Wipe front to back after using the bathroom.  Activity and limitations:  Do NOT drive or operate any equipment for 24 hours. The effects of anesthesia are still present and drowsiness may result.  Do NOT rest in bed all day.  Walking is encouraged.  Walk up and down stairs slowly.  You may resume your normal activity in one to two days or as indicated by your physician.  Sexual activity: NO intercourse for at least 2 weeks after the procedure, or as indicated by your physician.  Diet: Eat a light meal as desired this evening. You may resume your usual diet tomorrow.  Return to work: You may resume your work activities in one to two days or as indicated by your doctor.  What to expect after your surgery: Expect to have vaginal bleeding/discharge for 2-3 days and spotting for up to 10 days. It is not unusual to have soreness for up to 1-2 weeks. You may have a slight burning sensation when you urinate for the first day. Mild cramps may continue for a couple of days. You may have a regular period in 2-6 weeks.  Call your doctor for any of the following:  Excessive vaginal bleeding, saturating and changing one pad every hour.  Inability to urinate 6 hours after discharge from hospital.  Pain not relieved by pain medication.  Fever of 100.4 F or greater.  Unusual vaginal discharge or odor.   Call for an appointment:    Patients signature: ______________________  Nurses signature ________________________  Support person's signature_______________________

## 2014-08-21 NOTE — H&P (Signed)
  Chief Complaint: 53 y.o.  who presents with a possible early pregnancy failure  Details of Present Illness: The patient has had serial low titer quantitative bHCG levels.  BP 119/89 mmHg  Pulse 102  Temp(Src) 97.3 F (36.3 C) (Oral)  SpO2 99%  Past Medical History  Diagnosis Date  . Mania   . Depression   . Thyroid disease   . Headache(784.0)   . Bipolar disorder    History   Social History  . Marital Status: Married    Spouse Name: N/A    Number of Children: N/A  . Years of Education: N/A   Occupational History  . Not on file.   Social History Main Topics  . Smoking status: Former Smoker    Types: Cigarettes    Quit date: 09/15/1989  . Smokeless tobacco: Never Used  . Alcohol Use: No  . Drug Use: No  . Sexual Activity: Not Currently    Birth Control/ Protection: Surgical     Comment: Last Encounter a few months ago   Other Topics Concern  . Not on file   Social History Narrative   Family History  Problem Relation Age of Onset  . OCD Mother   . Dementia Father   . Bipolar disorder Sister   . Drug abuse Sister   . Alcohol abuse Sister   . Anxiety disorder Neg Hx   . Depression Neg Hx   . Paranoid behavior Neg Hx   . Schizophrenia Neg Hx   . Seizures Neg Hx   . Sexual abuse Neg Hx   . Physical abuse Neg Hx   . ADD / ADHD Other     Pertinent items are noted in HPI.  Pre-Op Diagnosis: Missed abortion, possible pregnancy of unknown location [phone]   Planned Procedure: Procedure(s): DILATATION AND EVACUATION  I have reviewed the patient's history and have completed the physical exam and [name] is acceptable for surgery.  Agnes Lawrence, MD 08/21/2014 9:40 AM

## 2014-08-21 NOTE — Transfer of Care (Signed)
Immediate Anesthesia Transfer of Care Note  Patient: [name]  Procedure(s) Performed: Procedure(s): DILATATION AND EVACUATION (N/A)  Patient Location: PACU  Anesthesia Type:MAC  Level of Consciousness: awake, alert  and oriented  Airway & Oxygen Therapy: Patient Spontanous Breathing and Patient connected to nasal cannula oxygen  Post-op Assessment: Report given to PACU RN, Post -op Vital signs reviewed and stable and Patient moving all extremities X 4  Post vital signs: Reviewed and stable  Complications: No apparent anesthesia complications

## 2014-08-22 ENCOUNTER — Encounter (HOSPITAL_COMMUNITY): Payer: Self-pay | Admitting: Obstetrics & Gynecology

## 2014-08-22 ENCOUNTER — Telehealth: Payer: Self-pay | Admitting: *Deleted

## 2014-08-22 NOTE — Telephone Encounter (Signed)
Spoke with husband, pt resting. Tolerated chemo without problems, denies vomiting-slight nausea- relief with meds. Drinking and eating, denies diarrhea or constipation. To call if has any questions

## 2014-08-22 NOTE — Telephone Encounter (Signed)
-----   Message from Ardeen Garland, RN sent at 08/18/2014  2:21 PM EST ----- Regarding: FW: chemo follow-up call   ----- Message -----    From: Renford Dills, RN    Sent: 08/18/2014   2:00 PM      To: Onc Triage Nurse [facility] Subject: chemo follow-up call                           1st Taxol/Carbo  Dr. Burr Medico  [phone]

## 2014-08-23 ENCOUNTER — Ambulatory Visit: Payer: Medicare HMO | Admitting: Obstetrics & Gynecology

## 2014-08-25 ENCOUNTER — Encounter: Payer: Self-pay | Admitting: Hematology

## 2014-08-25 ENCOUNTER — Ambulatory Visit (HOSPITAL_BASED_OUTPATIENT_CLINIC_OR_DEPARTMENT_OTHER): Payer: Medicare HMO | Admitting: Hematology

## 2014-08-25 ENCOUNTER — Other Ambulatory Visit (HOSPITAL_BASED_OUTPATIENT_CLINIC_OR_DEPARTMENT_OTHER): Payer: Medicare HMO

## 2014-08-25 ENCOUNTER — Telehealth: Payer: Self-pay | Admitting: Hematology

## 2014-08-25 VITALS — BP 148/97 | HR 75 | Temp 95.7°F | Resp 17 | Ht 66.0 in | Wt 136.9 lb

## 2014-08-25 DIAGNOSIS — C801 Malignant (primary) neoplasm, unspecified: Secondary | ICD-10-CM

## 2014-08-25 DIAGNOSIS — D72829 Elevated white blood cell count, unspecified: Secondary | ICD-10-CM

## 2014-08-25 DIAGNOSIS — D649 Anemia, unspecified: Secondary | ICD-10-CM

## 2014-08-25 DIAGNOSIS — C787 Secondary malignant neoplasm of liver and intrahepatic bile duct: Secondary | ICD-10-CM

## 2014-08-25 LAB — COMPREHENSIVE METABOLIC PANEL (CC13)
ALT: 11 U/L (ref 0–55)
ANION GAP: 9 meq/L (ref 3–11)
AST: 15 U/L (ref 5–34)
Albumin: 2 g/dL — ABNORMAL LOW (ref 3.5–5.0)
Alkaline Phosphatase: 257 U/L — ABNORMAL HIGH (ref 40–150)
BUN: 15.1 mg/dL (ref 7.0–26.0)
CO2: 26 meq/L (ref 22–29)
Calcium: 8.4 mg/dL (ref 8.4–10.4)
Chloride: 105 mEq/L (ref 98–109)
Creatinine: 0.5 mg/dL — ABNORMAL LOW (ref 0.6–1.1)
EGFR: >90 mL/min/{1.73_m2} (ref >90)
Glucose: 66 mg/dl — ABNORMAL LOW (ref 70–140)
Potassium: 3.8 mEq/L (ref 3.5–5.1)
Sodium: 140 mEq/L (ref 136–145)
Total Bilirubin: 0.65 mg/dL (ref 0.20–1.20)
Total Protein: 5.1 g/dL — ABNORMAL LOW (ref 6.4–8.3)

## 2014-08-25 LAB — CBC WITH DIFFERENTIAL/PLATELET
BASO%: 0.1 % (ref 0.0–2.0)
Basophils Absolute: 0 10*3/uL (ref 0.0–0.1)
EOS%: 0.5 % (ref 0.0–7.0)
Eosinophils Absolute: 0.1 10*3/uL (ref 0.0–0.5)
HCT: 33.1 % — ABNORMAL LOW (ref 34.8–46.6)
HGB: 9.8 g/dL — ABNORMAL LOW (ref 11.6–15.9)
LYMPH#: 1.5 10*3/uL (ref 0.9–3.3)
LYMPH%: 12.2 % — ABNORMAL LOW (ref 14.0–49.7)
MCH: 23.4 pg — AB (ref 25.1–34.0)
MCHC: 29.6 g/dL — AB (ref 31.5–36.0)
MCV: 79 fL — ABNORMAL LOW (ref 79.5–101.0)
MONO#: 0.6 10*3/uL (ref 0.1–0.9)
MONO%: 4.6 % (ref 0.0–14.0)
NEUT%: 82.6 % — AB (ref 38.4–76.8)
NEUTROS ABS: 10.2 10*3/uL — AB (ref 1.5–6.5)
PLATELETS: 180 10*3/uL (ref 145–400)
RBC: 4.19 10*6/uL (ref 3.70–5.45)
RDW: 20.2 % — ABNORMAL HIGH (ref 11.2–14.5)
WBC: 12.4 10*3/uL — AB (ref 3.9–10.3)

## 2014-08-25 NOTE — Telephone Encounter (Signed)
gv and printed appt sched and avs for pt for Dec....MW added tx.

## 2014-08-25 NOTE — Progress Notes (Signed)
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 Hepatosplenomegaly. Abdominal and pelvic ascites and Pathologically enlarged upper abdominal. CT chest (-).    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 Bipolar 2. Hypothyroidism   INTERVAL HISTORY: [name] returns for follow-up for toxicity checkup after first dose chemo. She tolerated it well overall, had mild nausea and vomited once the day after chemo. No other new complains. She overall feels slightly better. She eats two meals a day, has not been able to drink ensure much due to the taste. She is still spends most of time sitting in wheelchair during the day, she has a walker at home but has not used much. She denies any fever or chills.  She had endometrial biopsy early this week, which reviewed no malignant cells.  REVIEW OF SYSTEMS:   Constitutional: Denies fevers, chills, (+) weight loss and very fatigued  Eyes: Denies blurriness of vision Ears, nose, mouth, throat, and face: Denies mucositis or sore throat Respiratory: Denies cough, dyspnea or wheezes Cardiovascular: Denies palpitation, chest discomfort or lower extremity swelling Gastrointestinal:  (+) nausea better with Zofran, no heartburn or change in bowel habits Skin: Denies abnormal skin rashes. (+) b/l leg swelling  Lymphatics: Denies new lymphadenopathy or easy bruising Neurological:Denies numbness, tingling or new weaknesses Behavioral/Psych: Mood is stable, no new changes  All other systems were reviewed  with the patient and are negative.  I have reviewed the past medical history, past surgical history, social history and family history with the patient and they are unchanged from previous note.  ALLERGIES:  is allergic to buspar; cymbalta; hydroxyzine; and sertraline.  MEDICATIONS:  Current Outpatient Prescriptions  Medication Sig Dispense Refill  . benztropine (COGENTIN) 1 MG tablet Take 1 tablet (1 mg total) by mouth daily. (Patient taking differently: Take 1 mg by mouth at bedtime. ) 90 tablet 2  . buPROPion (WELLBUTRIN XL) 300 MG 24 hr tablet Take 1 tablet (300 mg total) by mouth daily. 90 tablet 2  . lactulose (CHRONULAC) 10 GM/15ML solution 15 ml q 4 hr as needed for constipation.  Max of 60 ml per day (Patient taking differently: Take 10 g by mouth daily as needed. 30 ml q 4 hr as needed for constipation.  Max of 60 ml per day) 500 mL 2  . levothyroxine (SYNTHROID, LEVOTHROID) 88 MCG tablet Take 88 mcg by mouth at bedtime.     Marland Kitchen morphine (MS CONTIN) 15 MG 12 hr tablet Take 1 tablet (15 mg total) by mouth every 12 (twelve) hours. 60 tablet 0  . ondansetron (ZOFRAN) 4 MG tablet Take 1 tablet (4 mg total) by mouth every 6 (six) hours as needed for nausea or vomiting. 60 tablet 4  . oxyCODONE (OXY IR/ROXICODONE) 5 MG immediate release tablet Take 1 tablet (5 mg total) by mouth every 4 (four) hours as needed for severe pain. 90 tablet 0  . propranolol (INDERAL) 10 MG tablet Take 1 tablet (10 mg total) by mouth 3 (three) times daily. 270 tablet 2  . risperiDONE (RISPERDAL) 3 MG tablet Take 1 tablet (3  mg total) by mouth at bedtime. 90 tablet 2  . zolpidem (AMBIEN) 10 MG tablet Take 1 tablet (10 mg total) by mouth at bedtime as needed for sleep. (Patient not taking: Reported on 08/17/2014) 90 tablet 1   No current facility-administered medications for this visit.    PHYSICAL EXAMINATION: ECOG PERFORMANCE STATUS: 2   Filed Vitals:   08/25/14 1033  BP: 148/97  Pulse: 75  Temp: 95.7 F  (35.4 C)  Resp: 17   Filed Weights   08/25/14 1033  Weight: 136 lb 14.4 oz (62.097 kg)    GENERAL:alert, no distress and comfortable SKIN: skin color, texture, turgor are normal, no rashes or significant lesions EYES: normal, Conjunctiva are pink and non-injected, sclera clear OROPHARYNX:no exudate, no erythema and lips, buccal mucosa, and tongue normal  NECK: supple, thyroid normal size, non-tender, without nodularity LYMPH:  no palpable lymphadenopathy in the cervical, axillary or inguinal LUNGS: clear to auscultation and percussion with normal breathing effort HEART: regular rate & rhythm and no murmurs and no lower extremity edema ABDOMEN:abdomen soft, non-tender and normal bowel sounds. (+) distended, (+) hepatomegaly, firm, palpable 10cm under rib cage  Musculoskeletal:no cyanosis of digits and no clubbing  NEURO: alert & oriented x 3 with fluent speech, no focal motor/sensory deficits  LABORATORY DATA:  I have reviewed the data as listed  CBC Latest Ref Rng 08/25/2014 08/18/2014 08/09/2014  WBC 3.9 - 10.3 10e3/uL 12.4(H) 27.4(H) 21.7(H)  Hemoglobin 11.6 - 15.9 g/dL 9.8(L) 10.7(L) 7.9(L)  Hematocrit 34.8 - 46.6 % 33.1(L) 36.8 27.1(L)  Platelets 145 - 400 10e3/uL 180 298 332    CMP Latest Ref Rng 08/25/2014 08/18/2014 08/01/2014  Glucose 70 - 140 mg/dl 66(L) 82 90  BUN 7.0 - 26.0 mg/dL 15.1 19.1 11.1  Creatinine 0.6 - 1.1 mg/dL 0.5(L) 0.7 0.7  Sodium 136 - 145 mEq/L 140 143 141  Potassium 3.5 - 5.1 mEq/L 3.8 4.2 4.2  Chloride 96 - 112 mEq/L - - -  CO2 22 - 29 mEq/L 26 26 26   Calcium 8.4 - 10.4 mg/dL 8.4 9.0 8.9  Total Protein 6.4 - 8.3 g/dL 5.1(L) 5.5(L) 5.9(L)  Total Bilirubin 0.20 - 1.20 mg/dL 0.65 0.65 0.84  Alkaline Phos 40 - 150 U/L 257(H) 228(H) 190(H)  AST 5 - 34 U/L 15 13 13   ALT 0 - 55 U/L 11 8 7    Results for [facility], [facility] (MRN [id number]) as of 08/25/2014 16:01  Ref. Range 07/25/2014 09:20 08/01/2014 13:28 08/18/2014 13:52 08/18/2014 13:52 08/18/2014 13:53   AFP Tumor Marker Latest Range: <6.1 ng/mL 1.9      CA 19-9 Latest Range: <35.0 U/mL     57710.9 (H)  CA 125 Latest Range: <35 U/mL 843 (H)  946 (H) 833.4 (H)   CEA Latest Range: 0.0-5.0 ng/mL  23.2 (H)       PATHOLOGY REPORT 08/21/14 Products of Conception - DECIDUA AND BLOOD CLOT. - PLEASE SEE COMMENT. Microscopic Comment The specimen is entirely submitted for microscopic examination. Sections show blood clot and decidualized stroma. There is no evidence of immature chorionic villi identified. Clinical and serological correlation is highly recommended. (HCL:kh 08-22-14)   RADIOGRAPHIC STUDIES: I have personally reviewed the radiological images as listed and agreed with the findings in the report.  I have reviewed her EGD and colonoscopy reports which was done on 11/20, which was negative for ulceration or mass.   ASSESSMENT & PLAN:  53 yo female with PMH of bipolar but otherwise healthy, no history of liver disease  or alcohol abuse, no history of blood transfusion, who presented with significant N/V, abdominal pain and bloating, New discovered multiple liver masses and small ascites, and elevated serum beta HCG, CA125 and CEA, and significant elevated CA 19.9.   1. Metastatic adenocarcinoma to liver, unknown primary. Likely cholangiocarcinoma, or occluded pancreatic cancer. -The patient and her family understand the overall poor prognosis, and his treatment goal is palliation. - Her EGD and colonoscopy was negative. Her case was reviewed in our GI tumor Board. The radiologist feels her liver lesions are most consistent with metastatic lesions, not typical for cholangiocarcinoma. However her pathology morphology is favor for upper GI origin, such as cholangiocarcinoma. 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 -Foundation one genetic test results is still  pending -She tolerated the first cycle of Carbo and Taxol well, I will continue current regiment and repeat scan after second cycle. If she does not report responding well, I will change her chemotherapy to Gemzar or 5-FU-based regimen.  2. Anemia, probably related to her underline malignancy.  -Improved after blood transfusion  3. Leukocytosis -No fever or chills, no signs of infection. This could be related to reaction to her underlying malignancy, although it is unusually high.   3. Bilateral leg edema -I checked Doppler ultrasound on her legs which showed no evidence of DVT. -this is likely related to her hypoalbuminemia, and the poor venous return. I encouraged her to elevate her legs and the use compression stocks. I would not use Lasix due to her poor by mouth intake.  4. Malnutrition and deconditioning secondary to her probably underline malignancy  -I encouraged her to take nutrition supplement, and the follow up dietitian here.  5. Deconditioning due to metastatic cancer -I'll refer her for home physical therapy. I encouraged her to be more active.  I will see her back on 12/30 for second cycle of chemotherapy.   All questions were answered. The patient knows to call the clinic with any problems, questions or concerns. No barriers to learning was detected.  I spent 20 minutes counseling the patient face to face. The total time spent in the appointment was 30 minutes and more than 50% was on counseling and review of test results     [name], MD 08/18/2014 11:02 AM

## 2014-08-31 ENCOUNTER — Encounter (HOSPITAL_COMMUNITY): Payer: Self-pay

## 2014-09-11 ENCOUNTER — Other Ambulatory Visit: Payer: Self-pay | Admitting: Hematology

## 2014-09-11 ENCOUNTER — Encounter: Payer: Self-pay | Admitting: *Deleted

## 2014-09-12 ENCOUNTER — Other Ambulatory Visit (HOSPITAL_BASED_OUTPATIENT_CLINIC_OR_DEPARTMENT_OTHER): Payer: Medicare HMO

## 2014-09-12 ENCOUNTER — Telehealth: Payer: Self-pay | Admitting: Hematology

## 2014-09-12 ENCOUNTER — Ambulatory Visit (HOSPITAL_BASED_OUTPATIENT_CLINIC_OR_DEPARTMENT_OTHER): Payer: Medicare HMO | Admitting: Hematology

## 2014-09-12 ENCOUNTER — Encounter: Payer: Self-pay | Admitting: Obstetrics & Gynecology

## 2014-09-12 ENCOUNTER — Encounter: Payer: Self-pay | Admitting: Hematology

## 2014-09-12 VITALS — BP 148/95 | HR 80 | Temp 97.9°F | Resp 18 | Ht 66.0 in | Wt 145.2 lb

## 2014-09-12 DIAGNOSIS — C801 Malignant (primary) neoplasm, unspecified: Secondary | ICD-10-CM

## 2014-09-12 DIAGNOSIS — F319 Bipolar disorder, unspecified: Secondary | ICD-10-CM

## 2014-09-12 DIAGNOSIS — C787 Secondary malignant neoplasm of liver and intrahepatic bile duct: Secondary | ICD-10-CM

## 2014-09-12 DIAGNOSIS — R600 Localized edema: Secondary | ICD-10-CM

## 2014-09-12 DIAGNOSIS — D649 Anemia, unspecified: Secondary | ICD-10-CM

## 2014-09-12 DIAGNOSIS — E46 Unspecified protein-calorie malnutrition: Secondary | ICD-10-CM

## 2014-09-12 LAB — CBC WITH DIFFERENTIAL/PLATELET
BASO%: 0.6 % (ref 0.0–2.0)
Basophils Absolute: 0.1 10*3/uL (ref 0.0–0.1)
EOS ABS: 0 10*3/uL (ref 0.0–0.5)
EOS%: 0.1 % (ref 0.0–7.0)
HCT: 26 % — ABNORMAL LOW (ref 34.8–46.6)
HGB: 8 g/dL — ABNORMAL LOW (ref 11.6–15.9)
LYMPH%: 15.3 % (ref 14.0–49.7)
MCH: 25.5 pg (ref 25.1–34.0)
MCHC: 31 g/dL — ABNORMAL LOW (ref 31.5–36.0)
MCV: 82.2 fL (ref 79.5–101.0)
MONO#: 0.9 10*3/uL (ref 0.1–0.9)
MONO%: 8.8 % (ref 0.0–14.0)
NEUT%: 75.2 % (ref 38.4–76.8)
NEUTROS ABS: 7.4 10*3/uL — AB (ref 1.5–6.5)
Platelets: 147 10*3/uL (ref 145–400)
RBC: 3.16 10*6/uL — AB (ref 3.70–5.45)
RDW: 29.6 % — ABNORMAL HIGH (ref 11.2–14.5)
WBC: 9.8 10*3/uL (ref 3.9–10.3)
lymph#: 1.5 10*3/uL (ref 0.9–3.3)

## 2014-09-12 LAB — COMPREHENSIVE METABOLIC PANEL (CC13)
ALBUMIN: 2 g/dL — AB (ref 3.5–5.0)
ALK PHOS: 240 U/L — AB (ref 40–150)
ALT: 49 U/L (ref 0–55)
AST: 61 U/L — AB (ref 5–34)
Anion Gap: 7 mEq/L (ref 3–11)
BUN: 8.9 mg/dL (ref 7.0–26.0)
CHLORIDE: 103 meq/L (ref 98–109)
CO2: 30 mEq/L — ABNORMAL HIGH (ref 22–29)
Calcium: 8.1 mg/dL — ABNORMAL LOW (ref 8.4–10.4)
Creatinine: 0.5 mg/dL — ABNORMAL LOW (ref 0.6–1.1)
EGFR: >90 mL/min/{1.73_m2} (ref >90)
Glucose: 89 mg/dl (ref 70–140)
Potassium: 3.8 mEq/L (ref 3.5–5.1)
SODIUM: 139 meq/L (ref 136–145)
TOTAL PROTEIN: 5 g/dL — AB (ref 6.4–8.3)
Total Bilirubin: 1 mg/dL (ref 0.20–1.20)

## 2014-09-12 NOTE — Progress Notes (Signed)
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 Hepatosplenomegaly. Abdominal and pelvic ascites and Pathologically enlarged upper abdominal. CT chest (-).    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 Bipolar 2. Hypothyroidism   INTERVAL HISTORY: [name] returns for follow-up. She is doing slightly better at home. She is tolerating physical therapy 3 times a week at home, she is able to walk for 15-20 minutes each time. Her appetite is still low, she is taking Ensure and a small meals. She is drinking adequately. Her pain seems less than before. She has some side effects with confusion and hallucination with moving, so her daughter has stopped her morphine ER. She is using oxycodone as needed, every 4-6 hours. No other new complaints, no fever or chills. Her weight is stable.  REVIEW OF SYSTEMS:   Constitutional: Denies fevers, chills, (+) weight loss and fatigued  Eyes: Denies blurriness of vision Ears, nose, mouth, throat, and face: Denies mucositis or sore throat Respiratory: Denies cough, dyspnea or wheezes Cardiovascular: Denies palpitation, chest discomfort or lower extremity swelling Gastrointestinal:  (+) nausea better with Zofran, no heartburn or change in bowel habits Skin: Denies abnormal skin rashes. (+) b/l leg swelling  Lymphatics: Denies new lymphadenopathy or easy bruising Neurological:Denies numbness, tingling or new weaknesses Behavioral/Psych: Mood is stable, no new changes  All other systems were reviewed with the patient and are  negative.  I have reviewed the past medical history, past surgical history, social history and family history with the patient and they are unchanged from previous note.  ALLERGIES:  is allergic to buspar; cymbalta; hydroxyzine; and sertraline.  MEDICATIONS:  Current Outpatient Prescriptions  Medication Sig Dispense Refill  . benztropine (COGENTIN) 1 MG tablet Take 1 tablet (1 mg total) by mouth daily. (Patient taking differently: Take 1 mg by mouth at bedtime. ) 90 tablet 2  . buPROPion (WELLBUTRIN XL) 300 MG 24 hr tablet Take 1 tablet (300 mg total) by mouth daily. 90 tablet 2  . lactulose (CHRONULAC) 10 GM/15ML solution 15 ml q 4 hr as needed for constipation.  Max of 60 ml per day (Patient taking differently: Take 10 g by mouth daily as needed. 30 ml q 4 hr as needed for constipation.  Max of 60 ml per day) 500 mL 2  . levothyroxine (SYNTHROID, LEVOTHROID) 88 MCG tablet Take 88 mcg by mouth at bedtime.     Marland Kitchen morphine (MS CONTIN) 15 MG 12 hr tablet Take 1 tablet (15 mg total) by mouth every 12 (twelve) hours. 60 tablet 0  . ondansetron (ZOFRAN) 4 MG tablet Take 1 tablet (4 mg total) by mouth every 6 (six) hours as needed for nausea or vomiting. 60 tablet 4  . oxyCODONE (OXY IR/ROXICODONE) 5 MG immediate release tablet Take 1 tablet (5 mg total) by mouth every 4 (four) hours as needed for severe pain. 90 tablet 0  . propranolol (INDERAL) 10 MG tablet Take 1 tablet (10 mg total) by mouth 3 (three) times daily. 270 tablet 2  . risperiDONE (RISPERDAL) 3 MG tablet Take 1 tablet (3 mg total) by mouth at  bedtime. 90 tablet 2  . zolpidem (AMBIEN) 10 MG tablet Take 1 tablet (10 mg total) by mouth at bedtime as needed for sleep. (Patient not taking: Reported on 08/17/2014) 90 tablet 1   No current facility-administered medications for this visit.    PHYSICAL EXAMINATION: ECOG PERFORMANCE STATUS: 2   Filed Vitals:   09/12/14 0850  BP: 148/95  Pulse: 80  Temp: 97.9 F (36.6 C)  Resp: 18    Filed Weights   09/12/14 0850  Weight: 145 lb 3.2 oz (65.862 kg)    GENERAL:alert, no distress and comfortable SKIN: skin color, texture, turgor are normal, no rashes or significant lesions EYES: normal, Conjunctiva are pink and non-injected, sclera clear OROPHARYNX:no exudate, no erythema and lips, buccal mucosa, and tongue normal  NECK: supple, thyroid normal size, non-tender, without nodularity LYMPH:  no palpable lymphadenopathy in the cervical, axillary or inguinal LUNGS: clear to auscultation and percussion with normal breathing effort HEART: regular rate & rhythm and no murmurs and no lower extremity edema ABDOMEN:abdomen soft, non-tender and normal bowel sounds. (+) distended, (+) hepatomegaly, firm, palpable 10cm under rib cage  Musculoskeletal:no cyanosis of digits and no clubbing  NEURO: alert & oriented x 3 with fluent speech, no focal motor/sensory deficits  LABORATORY DATA:  I have reviewed the data as listed  CBC Latest Ref Rng 09/12/2014 08/25/2014 08/18/2014  WBC 3.9 - 10.3 10e3/uL 9.8 12.4(H) 27.4(H)  Hemoglobin 11.6 - 15.9 g/dL 8.0(L) 9.8(L) 10.7(L)  Hematocrit 34.8 - 46.6 % 26.0(L) 33.1(L) 36.8  Platelets 145 - 400 10e3/uL 147 180 298    CMP Latest Ref Rng 09/12/2014 08/25/2014 08/18/2014  Glucose 70 - 140 mg/dl 89 66(L) 82  BUN 7.0 - 26.0 mg/dL 8.9 15.1 19.1  Creatinine 0.6 - 1.1 mg/dL 0.5(L) 0.5(L) 0.7  Sodium 136 - 145 mEq/L 139 140 143  Potassium 3.5 - 5.1 mEq/L 3.8 3.8 4.2  Chloride 96 - 112 mEq/L - - -  CO2 22 - 29 mEq/L 30(H) 26 26  Calcium 8.4 - 10.4 mg/dL 8.1(L) 8.4 9.0  Total Protein 6.4 - 8.3 g/dL 5.0(L) 5.1(L) 5.5(L)  Total Bilirubin 0.20 - 1.20 mg/dL 1.00 0.65 0.65  Alkaline Phos 40 - 150 U/L 240(H) 257(H) 228(H)  AST 5 - 34 U/L 61(H) 15 13  ALT 0 - 55 U/L 49 11 8      Surgical path 08/03/14 - ADENOCARCINOMA. Microscopic Comment The core biopsies are extensively involved by adenocarcinoma with focal goblet cells and foci with signet  ring morphology. Immunohistochemistry will be performed and reported as an addendum. (JDP:gt, 08/04/14) Addendum: Immunohistochemistry is performed and the tumor is positive with Cytokeratin 7 and negative with Cytokeratin 20, CDX2, thyroid transcription factor-1, Napsin A, estrogen receptor, progesterone receptor and gross cystic disease fluid protein. The immunophenotype is nonspecific.  RADIOGRAPHIC STUDIES: I have personally reviewed the radiological images as listed and agreed with the findings in the report.  I have reviewed her EGD and colonoscopy reports which was done on 11/20, which was negative for ulceration or mass.   ASSESSMENT & PLAN:  53 yo female with PMH of bipolar but otherwise healthy, no history of liver disease or alcohol abuse, no history of blood transfusion, who presented with significant N/V, abdominal pain and bloating, New discovered multiple liver masses and small ascites, and elevated serum beta HCG, CA125 and CEA.   1. 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 -The patient and her family understand the overall poor prognosis, and  his treatment goal is palliation. - Her case was reviewed in our GI tumor Board this week. The radiologist feels her liver lesions are most consistent with metastatic lesions, not typical for cholangiocarcinoma, but pathology morphology favors cholangiocarcinoma or pancreatic cancer. CA19.9 57,711. 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  -The lab are adequate for treatment. Will continue second cycle chemo tomorrow. Will increase her Taxol to 175mg /m2, and keep carbo at AUC 5.   2. Anemia, probably related to her underline malignancy and anemia -Her hemoglobin dropped again, she appears pale today. We'll consider blood transfusion this week.   3. Bilateral leg edema -I checked Doppler ultrasound on her legs which  showed no evidence of DVT. -this is likely related to her hypoalbuminemia, and the poor venous return. I encouraged her to elevate her legs and the use compression stocks. I would not use Lasix due to her poor by mouth intake.  4. Elevate beta hCG -she was seen by OB/GYN Dr. Laurance Flatten and will follow up.  endometrial biopsy was negative. This is likely related to her underlying malignancy.   5. Malnutrition and deconditioning secondary to her probably underline malignancy  -I encouraged her to take nutrition supplement, and continue home PT.   6. Bipolar -stable mood, continue meds  I will see her back in 3 weeks with a restaging CT chest, abdomen and pelvis.   All questions were answered. The patient knows to call the clinic with any problems, questions or concerns. No barriers to learning was detected. I spent 15 minutes counseling the patient face to face. The total time spent in the appointment was 20 minutes and more than 50% was on counseling and review of test results     [name], MD 09/12/2014

## 2014-09-12 NOTE — Telephone Encounter (Signed)
gv and printed appt sched and avs for pt for DEC and Jan 2016....sed added tx.

## 2014-09-13 ENCOUNTER — Ambulatory Visit (HOSPITAL_BASED_OUTPATIENT_CLINIC_OR_DEPARTMENT_OTHER): Payer: Medicare HMO

## 2014-09-13 DIAGNOSIS — C801 Malignant (primary) neoplasm, unspecified: Secondary | ICD-10-CM

## 2014-09-13 DIAGNOSIS — C787 Secondary malignant neoplasm of liver and intrahepatic bile duct: Secondary | ICD-10-CM

## 2014-09-13 DIAGNOSIS — Z5111 Encounter for antineoplastic chemotherapy: Secondary | ICD-10-CM

## 2014-09-13 MED ORDER — DIPHENHYDRAMINE HCL 50 MG/ML IJ SOLN
INTRAMUSCULAR | Status: AC
Start: 2014-09-13 — End: 2014-09-13
  Filled 2014-09-13: qty 1

## 2014-09-13 MED ORDER — ONDANSETRON 16 MG/50ML IVPB (CHCC)
16.0000 mg | Freq: Once | INTRAVENOUS | Status: AC
  Administered 2014-09-13: 16 mg via INTRAVENOUS

## 2014-09-13 MED ORDER — DIPHENHYDRAMINE HCL 50 MG/ML IJ SOLN
50.0000 mg | Freq: Once | INTRAMUSCULAR | Status: AC
  Administered 2014-09-13: 50 mg via INTRAVENOUS

## 2014-09-13 MED ORDER — DEXAMETHASONE SODIUM PHOSPHATE 20 MG/5ML IJ SOLN
INTRAMUSCULAR | Status: AC
  Filled 2014-09-13: qty 5

## 2014-09-13 MED ORDER — CARBOPLATIN CHEMO INJECTION 600 MG/60ML
520.0000 mg | Freq: Once | INTRAVENOUS | Status: AC
  Administered 2014-09-13: 520 mg via INTRAVENOUS
  Filled 2014-09-13: qty 52

## 2014-09-13 MED ORDER — ONDANSETRON 16 MG/50ML IVPB (CHCC)
INTRAVENOUS | Status: AC
  Filled 2014-09-13: qty 16

## 2014-09-13 MED ORDER — FAMOTIDINE IN NACL 20-0.9 MG/50ML-% IV SOLN
INTRAVENOUS | Status: AC
  Filled 2014-09-13: qty 50

## 2014-09-13 MED ORDER — FAMOTIDINE IN NACL 20-0.9 MG/50ML-% IV SOLN
20.0000 mg | Freq: Once | INTRAVENOUS | Status: AC
  Administered 2014-09-13: 20 mg via INTRAVENOUS

## 2014-09-13 MED ORDER — DEXAMETHASONE SODIUM PHOSPHATE 20 MG/5ML IJ SOLN
20.0000 mg | Freq: Once | INTRAMUSCULAR | Status: AC
  Administered 2014-09-13: 20 mg via INTRAVENOUS

## 2014-09-13 MED ORDER — DEXTROSE 5 % IV SOLN
175.0000 mg/m2 | Freq: Once | INTRAVENOUS | Status: AC
  Administered 2014-09-13: 294 mg via INTRAVENOUS
  Filled 2014-09-13: qty 49

## 2014-09-13 MED ORDER — SODIUM CHLORIDE 0.9 % IV SOLN
Freq: Once | INTRAVENOUS | Status: AC
  Administered 2014-09-13: 09:00:00 via INTRAVENOUS

## 2014-09-13 NOTE — Patient Instructions (Signed)
[facility] Discharge Instructions for Patients Receiving Chemotherapy  Today you received the following chemotherapy agents:  Taxol & Carboplatin  To help prevent nausea and vomiting after your treatment, we encourage you to take your nausea medication    If you develop nausea and vomiting that is not controlled by your nausea medication, call the clinic.   BELOW ARE SYMPTOMS THAT SHOULD BE REPORTED IMMEDIATELY:  *FEVER GREATER THAN 100.5 F  *CHILLS WITH OR WITHOUT FEVER  NAUSEA AND VOMITING THAT IS NOT CONTROLLED WITH YOUR NAUSEA MEDICATION  *UNUSUAL SHORTNESS OF BREATH  *UNUSUAL BRUISING OR BLEEDING  TENDERNESS IN MOUTH AND THROAT WITH OR WITHOUT PRESENCE OF ULCERS  *URINARY PROBLEMS  *BOWEL PROBLEMS  UNUSUAL RASH Items with * indicate a potential emergency and should be followed up as soon as possible.  Feel free to call the clinic you have any questions or concerns. The clinic phone number is [phone].    

## 2014-09-14 ENCOUNTER — Ambulatory Visit: Payer: Self-pay

## 2014-09-14 ENCOUNTER — Other Ambulatory Visit: Payer: Self-pay | Admitting: *Deleted

## 2014-09-20 ENCOUNTER — Encounter (HOSPITAL_COMMUNITY): Payer: Self-pay | Admitting: Psychiatry

## 2014-09-20 ENCOUNTER — Ambulatory Visit (INDEPENDENT_AMBULATORY_CARE_PROVIDER_SITE_OTHER): Payer: Medicare HMO | Admitting: Psychiatry

## 2014-09-20 VITALS — BP 155/108 | HR 90 | Ht 66.0 in | Wt 145.8 lb

## 2014-09-20 DIAGNOSIS — F319 Bipolar disorder, unspecified: Secondary | ICD-10-CM

## 2014-09-20 MED ORDER — PROPRANOLOL HCL 10 MG PO TABS: 10.0000 mg | ORAL_TABLET | Freq: Three times a day (TID) | ORAL | Status: DC

## 2014-09-20 MED ORDER — BENZTROPINE MESYLATE 1 MG PO TABS: 1.0000 mg | ORAL_TABLET | Freq: Every day | ORAL | Status: DC

## 2014-09-20 MED ORDER — BUPROPION HCL ER (XL) 300 MG PO TB24: 300.0000 mg | ORAL_TABLET | Freq: Every day | ORAL | Status: DC

## 2014-09-20 MED ORDER — ZOLPIDEM TARTRATE 10 MG PO TABS: 10.0000 mg | ORAL_TABLET | Freq: Every evening | ORAL | Status: DC | PRN

## 2014-09-20 MED ORDER — RISPERIDONE 3 MG PO TABS: 3.0000 mg | ORAL_TABLET | Freq: Every day | ORAL | Status: DC

## 2014-09-20 NOTE — Progress Notes (Signed)
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 10 MG tablet    Sig: Take 1 tablet (10 mg total) by mouth at bedtime as needed for sleep.    Dispense:  90 tablet    Refill:  1   Medical Decision Making Problem Points:  Established problem, stable/improving (1), New problem, with no additional work-up planned (3) and Review of last therapy session (1) Data Points:  Review or order clinical lab tests (1) Review of medication regiment & side effects (2) Review of new medications or change in dosage (2)  I certify that outpatient services furnished can reasonably be expected  to improve the patient's condition.   Levonne Spiller, MD

## 2014-09-27 ENCOUNTER — Encounter (HOSPITAL_COMMUNITY): Payer: Self-pay

## 2014-09-27 ENCOUNTER — Ambulatory Visit (HOSPITAL_COMMUNITY)
Admission: RE | Admit: 2014-09-27 | Discharge: 2014-09-27 | Disposition: A | Discharge status: Home / Self Care | Payer: Medicare HMO | Source: Ambulatory Visit | Attending: Hematology | Admitting: Hematology

## 2014-09-27 ENCOUNTER — Other Ambulatory Visit (HOSPITAL_BASED_OUTPATIENT_CLINIC_OR_DEPARTMENT_OTHER): Payer: Medicare HMO

## 2014-09-27 DIAGNOSIS — C787 Secondary malignant neoplasm of liver and intrahepatic bile duct: Secondary | ICD-10-CM | POA: Diagnosis not present

## 2014-09-27 DIAGNOSIS — C801 Malignant (primary) neoplasm, unspecified: Secondary | ICD-10-CM | POA: Diagnosis not present

## 2014-09-27 DIAGNOSIS — Z9221 Personal history of antineoplastic chemotherapy: Secondary | ICD-10-CM | POA: Diagnosis not present

## 2014-09-27 DIAGNOSIS — D649 Anemia, unspecified: Secondary | ICD-10-CM

## 2014-09-27 LAB — CBC WITH DIFFERENTIAL/PLATELET
BASO%: 0.3 % (ref 0.0–2.0)
BASOS ABS: 0 10*3/uL (ref 0.0–0.1)
EOS%: 0.1 % (ref 0.0–7.0)
Eosinophils Absolute: 0 10*3/uL (ref 0.0–0.5)
HCT: 30.1 % — ABNORMAL LOW (ref 34.8–46.6)
HGB: 8.9 g/dL — ABNORMAL LOW (ref 11.6–15.9)
LYMPH#: 1.7 10*3/uL (ref 0.9–3.3)
LYMPH%: 24.9 % (ref 14.0–49.7)
MCH: 27.7 pg (ref 25.1–34.0)
MCHC: 29.6 g/dL — AB (ref 31.5–36.0)
MCV: 93.8 fL (ref 79.5–101.0)
MONO#: 0.9 10*3/uL (ref 0.1–0.9)
MONO%: 12.7 % (ref 0.0–14.0)
NEUT#: 4.3 10*3/uL (ref 1.5–6.5)
NEUT%: 62 % (ref 38.4–76.8)
Platelets: 198 10*3/uL (ref 145–400)
RBC: 3.21 10*6/uL — ABNORMAL LOW (ref 3.70–5.45)
RDW: 25.1 % — AB (ref 11.2–14.5)
WBC: 7 10*3/uL (ref 3.9–10.3)

## 2014-09-27 LAB — COMPREHENSIVE METABOLIC PANEL (CC13)
ALBUMIN: 2.4 g/dL — AB (ref 3.5–5.0)
ALK PHOS: 228 U/L — AB (ref 40–150)
ALT: 30 U/L (ref 0–55)
AST: 41 U/L — ABNORMAL HIGH (ref 5–34)
Anion Gap: 8 mEq/L (ref 3–11)
BILIRUBIN TOTAL: 0.77 mg/dL (ref 0.20–1.20)
BUN: 6.6 mg/dL — AB (ref 7.0–26.0)
CALCIUM: 8.7 mg/dL (ref 8.4–10.4)
CO2: 31 meq/L — AB (ref 22–29)
Chloride: 100 mEq/L (ref 98–109)
Creatinine: 0.5 mg/dL — ABNORMAL LOW (ref 0.6–1.1)
EGFR: >90 mL/min/{1.73_m2} (ref >90)
GLUCOSE: 74 mg/dL (ref 70–140)
Potassium: 3.8 mEq/L (ref 3.5–5.1)
SODIUM: 139 meq/L (ref 136–145)
TOTAL PROTEIN: 6.1 g/dL — AB (ref 6.4–8.3)

## 2014-09-27 MED ORDER — IOHEXOL 300 MG/ML  SOLN
100.0000 mL | Freq: Once | INTRAMUSCULAR | Status: AC | PRN
  Administered 2014-09-27: 100 mL via INTRAVENOUS

## 2014-09-28 LAB — CA 125: CA 125: 1698 U/mL — AB (ref <35)

## 2014-09-28 LAB — CEA: CEA: 17.9 ng/mL — ABNORMAL HIGH (ref 0.0–5.0)

## 2014-09-28 LAB — CANCER ANTIGEN 19-9: CA 19-9: 86243.8 U/mL — ABNORMAL HIGH (ref <35.0)

## 2014-10-03 ENCOUNTER — Telehealth: Payer: Self-pay | Admitting: Hematology

## 2014-10-03 NOTE — Telephone Encounter (Signed)
Lm to confirm lab appt for 10/04/14

## 2014-10-04 ENCOUNTER — Ambulatory Visit: Payer: Self-pay

## 2014-10-04 ENCOUNTER — Telehealth: Payer: Self-pay | Admitting: *Deleted

## 2014-10-04 ENCOUNTER — Encounter: Payer: Self-pay | Admitting: Hematology

## 2014-10-04 ENCOUNTER — Telehealth: Payer: Self-pay | Admitting: Hematology

## 2014-10-04 ENCOUNTER — Other Ambulatory Visit (HOSPITAL_BASED_OUTPATIENT_CLINIC_OR_DEPARTMENT_OTHER): Payer: Medicare HMO

## 2014-10-04 ENCOUNTER — Ambulatory Visit (HOSPITAL_BASED_OUTPATIENT_CLINIC_OR_DEPARTMENT_OTHER): Payer: Medicare HMO | Admitting: Hematology

## 2014-10-04 ENCOUNTER — Other Ambulatory Visit: Payer: Self-pay | Admitting: *Deleted

## 2014-10-04 VITALS — BP 150/104 | HR 79 | Temp 98.1°F | Resp 20 | Ht 66.0 in | Wt 137.6 lb

## 2014-10-04 DIAGNOSIS — D649 Anemia, unspecified: Secondary | ICD-10-CM

## 2014-10-04 DIAGNOSIS — C801 Malignant (primary) neoplasm, unspecified: Secondary | ICD-10-CM

## 2014-10-04 DIAGNOSIS — J910 Malignant pleural effusion: Secondary | ICD-10-CM

## 2014-10-04 DIAGNOSIS — C787 Secondary malignant neoplasm of liver and intrahepatic bile duct: Secondary | ICD-10-CM

## 2014-10-04 DIAGNOSIS — E46 Unspecified protein-calorie malnutrition: Secondary | ICD-10-CM

## 2014-10-04 DIAGNOSIS — R609 Edema, unspecified: Secondary | ICD-10-CM

## 2014-10-04 LAB — COMPREHENSIVE METABOLIC PANEL (CC13)
ALBUMIN: 2.2 g/dL — AB (ref 3.5–5.0)
ALT: 19 U/L (ref 0–55)
ANION GAP: 9 meq/L (ref 3–11)
AST: 21 U/L (ref 5–34)
Alkaline Phosphatase: 183 U/L — ABNORMAL HIGH (ref 40–150)
BUN: 8 mg/dL (ref 7.0–26.0)
CALCIUM: 8.3 mg/dL — AB (ref 8.4–10.4)
CHLORIDE: 104 meq/L (ref 98–109)
CO2: 26 mEq/L (ref 22–29)
Creatinine: 0.5 mg/dL — ABNORMAL LOW (ref 0.6–1.1)
Glucose: 80 mg/dl (ref 70–140)
Potassium: 3.8 mEq/L (ref 3.5–5.1)
SODIUM: 139 meq/L (ref 136–145)
Total Bilirubin: 0.4 mg/dL (ref 0.20–1.20)
Total Protein: 5.5 g/dL — ABNORMAL LOW (ref 6.4–8.3)

## 2014-10-04 LAB — CBC WITH DIFFERENTIAL/PLATELET
BASO%: 1.3 % (ref 0.0–2.0)
Basophils Absolute: 0.1 10*3/uL (ref 0.0–0.1)
EOS%: 0.1 % (ref 0.0–7.0)
Eosinophils Absolute: 0 10*3/uL (ref 0.0–0.5)
HCT: 27.7 % — ABNORMAL LOW (ref 34.8–46.6)
HGB: 8.5 g/dL — ABNORMAL LOW (ref 11.6–15.9)
LYMPH%: 20.6 % (ref 14.0–49.7)
MCH: 28.5 pg (ref 25.1–34.0)
MCHC: 30.5 g/dL — ABNORMAL LOW (ref 31.5–36.0)
MCV: 93.2 fL (ref 79.5–101.0)
MONO#: 0.8 10*3/uL (ref 0.1–0.9)
MONO%: 9.8 % (ref 0.0–14.0)
NEUT#: 5.4 10*3/uL (ref 1.5–6.5)
NEUT%: 68.2 % (ref 38.4–76.8)
Platelets: 247 10*3/uL (ref 145–400)
RBC: 2.97 10*6/uL — AB (ref 3.70–5.45)
RDW: 25.2 % — ABNORMAL HIGH (ref 11.2–14.5)
WBC: 8 10*3/uL (ref 3.9–10.3)
lymph#: 1.6 10*3/uL (ref 0.9–3.3)

## 2014-10-04 MED ORDER — OXYCODONE HCL ER 10 MG PO T12A: 10.0000 mg | EXTENDED_RELEASE_TABLET | Freq: Three times a day (TID) | ORAL | Status: DC

## 2014-10-04 MED ORDER — PROCHLORPERAZINE MALEATE 10 MG PO TABS: 10.0000 mg | ORAL_TABLET | Freq: Four times a day (QID) | ORAL | Status: DC | PRN

## 2014-10-04 MED ORDER — DEXAMETHASONE 4 MG PO TABS: 4.0000 mg | ORAL_TABLET | Freq: Every day | ORAL | Status: DC

## 2014-10-04 NOTE — Progress Notes (Signed)
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 Hepatosplenomegaly. Abdominal and pelvic ascites and Pathologically enlarged upper abdominal. CT chest (-).    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 Bipolar 2. Hypothyroidism   INTERVAL HISTORY: [name] returns for follow-up. She had more nausea after last cycle chemo, which lasted about 4 days. She recovered well. She has good appetite now, and is more mobile, with a walker. She still has abd pain 5/10, she is on morphine ER 15 and oxycodone.  She has confusion and hallucination with morphine, and did much better with OxyContin before, which is not covered by her insurance. She also reports slightly worsening of abdominal distention, and some shortness of breath when she lays flat.    REVIEW OF SYSTEMS:   Constitutional: Denies fevers, chills, (+) weight loss and fatigued  Eyes: Denies blurriness of vision Ears, nose, mouth, throat, and face: Denies mucositis or sore throat Respiratory: Denies cough, dyspnea or wheezes Cardiovascular: Denies palpitation, chest discomfort or lower extremity swelling Gastrointestinal:  (+) nausea better with Zofran, no heartburn or change in bowel habits Skin: Denies abnormal skin rashes. (+) b/l leg swelling  Lymphatics: Denies new lymphadenopathy or easy bruising Neurological:Denies numbness, tingling or new weaknesses Behavioral/Psych: Mood is stable, no new changes  All other systems were reviewed with the patient and are negative.  I have reviewed the past medical  history, past surgical history, social history and family history with the patient and they are unchanged from previous note.  ALLERGIES:  is allergic to buspar; cymbalta; hydroxyzine; and sertraline.  MEDICATIONS:  Current Outpatient Prescriptions  Medication Sig Dispense Refill  . benztropine (COGENTIN) 1 MG tablet Take 1 tablet (1 mg total) by mouth at bedtime. 90 tablet 2  . buPROPion (WELLBUTRIN XL) 300 MG 24 hr tablet Take 1 tablet (300 mg total) by mouth daily. 90 tablet 2  . lactulose (CHRONULAC) 10 GM/15ML solution 15 ml q 4 hr as needed for constipation.  Max of 60 ml per day (Patient taking differently: Take 10 g by mouth daily as needed. 30 ml q 4 hr as needed for constipation.  Max of 60 ml per day) 500 mL 2  . levothyroxine (SYNTHROID, LEVOTHROID) 88 MCG tablet Take 88 mcg by mouth at bedtime.     Marland Kitchen morphine (MS CONTIN) 15 MG 12 hr tablet Take 1 tablet (15 mg total) by mouth every 12 (twelve) hours. 60 tablet 0  . ondansetron (ZOFRAN) 4 MG tablet Take 1 tablet (4 mg total) by mouth every 6 (six) hours as needed for nausea or vomiting. 60 tablet 4  . oxyCODONE (OXY IR/ROXICODONE) 5 MG immediate release tablet Take 1 tablet (5 mg total) by mouth every 4 (four) hours as needed for severe pain. 90 tablet 0  . propranolol (INDERAL) 10 MG tablet Take 1 tablet (10 mg total) by mouth 3 (three) times daily. 270 tablet 2  . risperiDONE (RISPERDAL) 3 MG tablet Take 1 tablet (3 mg total) by mouth at bedtime. 90 tablet 2  . zolpidem (AMBIEN) 10 MG tablet Take 1 tablet (10 mg total) by  mouth at bedtime as needed for sleep. 90 tablet 1   No current facility-administered medications for this visit.    PHYSICAL EXAMINATION: ECOG PERFORMANCE STATUS: 2   Filed Vitals:   10/04/14 0859  BP: 150/104  Pulse: 79  Temp: 98.1 F (36.7 C)  Resp: 20   Filed Weights   10/04/14 0859  Weight: 137 lb 9.6 oz (62.415 kg)    GENERAL:alert, no distress and comfortable SKIN: skin color, texture,  turgor are normal, no rashes or significant lesions EYES: normal, Conjunctiva are pink and non-injected, sclera clear OROPHARYNX:no exudate, no erythema and lips, buccal mucosa, and tongue normal  NECK: supple, thyroid normal size, non-tender, without nodularity LYMPH:  no palpable lymphadenopathy in the cervical, axillary or inguinal LUNGS: clear to auscultation and percussion with normal breathing effort HEART: regular rate & rhythm and no murmurs and no lower extremity edema ABDOMEN:abdomen soft,  and normal bowel sounds. (+) distended, (+) hepatomegaly, firm, palpable 10cm under rib cage  Musculoskeletal:no cyanosis of digits and no clubbing  NEURO: alert & oriented x 3 with fluent speech, no focal motor/sensory deficits  LABORATORY DATA:  I have reviewed the data as listed  CBC Latest Ref Rng 10/04/2014 09/27/2014 09/12/2014  WBC 3.9 - 10.3 10e3/uL 8.0 7.0 9.8  Hemoglobin 11.6 - 15.9 g/dL 8.5(L) 8.9(L) 8.0(L)  Hematocrit 34.8 - 46.6 % 27.7(L) 30.1(L) 26.0(L)  Platelets 145 - 400 10e3/uL 247 198 147    CMP Latest Ref Rng 09/27/2014 09/12/2014 08/25/2014  Glucose 70 - 140 mg/dl 74 89 66(L)  BUN 7.0 - 26.0 mg/dL 6.6(L) 8.9 15.1  Creatinine 0.6 - 1.1 mg/dL 0.5(L) 0.5(L) 0.5(L)  Sodium 136 - 145 mEq/L 139 139 140  Potassium 3.5 - 5.1 mEq/L 3.8 3.8 3.8  Chloride 96 - 112 mEq/L - - -  CO2 22 - 29 mEq/L 31(H) 30(H) 26  Calcium 8.4 - 10.4 mg/dL 8.7 8.1(L) 8.4  Total Protein 6.4 - 8.3 g/dL 6.1(L) 5.0(L) 5.1(L)  Total Bilirubin 0.20 - 1.20 mg/dL 0.77 1.00 0.65  Alkaline Phos 40 - 150 U/L 228(H) 240(H) 257(H)  AST 5 - 34 U/L 41(H) 61(H) 15  ALT 0 - 55 U/L 30 49 11      Surgical path 08/03/14 - ADENOCARCINOMA. Microscopic Comment The core biopsies are extensively involved by adenocarcinoma with focal goblet cells and foci with signet ring morphology. Immunohistochemistry will be performed and reported as an addendum. (JDP:gt, 08/04/14) Addendum: Immunohistochemistry is performed and  the tumor is positive with Cytokeratin 7 and negative with Cytokeratin 20, CDX2, thyroid transcription factor-1, Napsin A, estrogen receptor, progesterone receptor and gross cystic disease fluid protein. The immunophenotype is nonspecific.  RADIOGRAPHIC STUDIES: I have personally reviewed the radiological images as listed and agreed with the findings in the report.  CT chest 09/27/2014 1. Mild left supraclavicular adenopathy, for which nodal metastasis cannot be excluded. This is grossly similar. 2. Otherwise, no evidence of metastatic disease within the chest. 3. Right greater than left pleural effusions are similar. Similar right base atelectasis. 4. Borderline cardiomegaly with increase in mild to moderate pericardial effusion  CT abdomen and pelvis 09/27/2014 1. Progressive hepatic metastasis with persistent marked hepatomegaly. 2. Similar nodal metastasis in the portal caval space. 3. Increase in large volume abdominal pelvic ascites. 4. Diffuse anasarca.  I have reviewed her EGD and colonoscopy reports which was done on 11/20, which was negative for ulceration or mass.   ASSESSMENT & PLAN:  54 yo female with PMH of bipolar but otherwise healthy, no  history of liver disease or alcohol abuse, no history of blood transfusion, who presented with significant N/V, abdominal pain and bloating, New discovered multiple liver masses and small ascites, and elevated serum beta HCG, CA125 and CEA.   1. 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 -The patient and her family understand the overall poor prognosis, and his treatment goal is palliation. - Her case was reviewed in our GI tumor Board. The radiologist feels her liver lesions are most consistent with metastatic lesions, not typical for cholangiocarcinoma, but pathology morphology favors cholangiocarcinoma or pancreatic cancer. CA19.9 57,711. 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  -I reviewed her restaging CT scan, which showed disease progression in her liver and worsening ascites. I suggest to change her regimen to cisplatin and gemcitabine. Side effects reviewed with her and her family members, she agrees to proceed.  -We'll arrange IR paracentesis for symptom improvement.   2. Anemia, probably related to her underline malignancy and anemia -Her hemoglobIs 8.5 today, she will likely need blood transfusion next time.  3. Bilateral leg edema -I checked Doppler ultrasound on her legs which showed no evidence of DVT. -this is likely related to her hypoalbuminemia, and the poor venous return. I encouraged her to elevate her legs and the use compression stocks. I would not use Lasix due to her poor by mouth intake.  4. malignant ascites -We'll arrange IR for paracentesis for symptom relief.    5. Malnutrition and deconditioning secondary to her probably underline malignancy  -I encouraged her to take nutrition supplement, and continue home PT.   6. Bipolar -stable mood, continue meds  Plan -hold chemo today, change to cisplatin and gemcitabine, starting next Monday -IR or paracentesis and port placement  -will call your insurance for oxycontin preauthorization    All questions were answered. The patient knows to call the clinic with any problems, questions or concerns. No barriers to learning was detected. I spent 20 minutes counseling the patient face to face. The total time spent in the appointment was 25 minutes and more than 50% was on counseling and review of test results     [name], MD 10/04/2014

## 2014-10-04 NOTE — Telephone Encounter (Signed)
Per staff message and POF I have scheduled appts. Advised scheduler of appts. JMW  

## 2014-10-04 NOTE — Telephone Encounter (Signed)
Gave avs & calendar for January & February. Sent mess to sch tx.

## 2014-10-05 LAB — CANCER ANTIGEN 19-9: CA 19-9: 65508.4 U/mL — ABNORMAL HIGH (ref <35.0)

## 2014-10-06 ENCOUNTER — Telehealth: Payer: Self-pay | Admitting: Hematology

## 2014-10-06 NOTE — Telephone Encounter (Signed)
Confirm appointments for January & February

## 2014-10-09 ENCOUNTER — Other Ambulatory Visit: Payer: Self-pay | Admitting: *Deleted

## 2014-10-09 ENCOUNTER — Other Ambulatory Visit: Payer: Self-pay

## 2014-10-10 ENCOUNTER — Other Ambulatory Visit (HOSPITAL_BASED_OUTPATIENT_CLINIC_OR_DEPARTMENT_OTHER): Payer: Commercial Managed Care - HMO

## 2014-10-10 ENCOUNTER — Ambulatory Visit (HOSPITAL_BASED_OUTPATIENT_CLINIC_OR_DEPARTMENT_OTHER): Payer: Commercial Managed Care - HMO

## 2014-10-10 ENCOUNTER — Other Ambulatory Visit: Payer: Self-pay | Admitting: Radiology

## 2014-10-10 DIAGNOSIS — C787 Secondary malignant neoplasm of liver and intrahepatic bile duct: Secondary | ICD-10-CM

## 2014-10-10 DIAGNOSIS — C801 Malignant (primary) neoplasm, unspecified: Secondary | ICD-10-CM

## 2014-10-10 DIAGNOSIS — Z5111 Encounter for antineoplastic chemotherapy: Secondary | ICD-10-CM

## 2014-10-10 LAB — COMPREHENSIVE METABOLIC PANEL (CC13)
ALT: 12 U/L (ref 0–55)
AST: 14 U/L (ref 5–34)
Albumin: 2.6 g/dL — ABNORMAL LOW (ref 3.5–5.0)
Alkaline Phosphatase: 169 U/L — ABNORMAL HIGH (ref 40–150)
Anion Gap: 10 mEq/L (ref 3–11)
BILIRUBIN TOTAL: 0.58 mg/dL (ref 0.20–1.20)
BUN: 7.7 mg/dL (ref 7.0–26.0)
CHLORIDE: 103 meq/L (ref 98–109)
CO2: 27 meq/L (ref 22–29)
CREATININE: 0.5 mg/dL — AB (ref 0.6–1.1)
Calcium: 8.8 mg/dL (ref 8.4–10.4)
Glucose: 96 mg/dl (ref 70–140)
Potassium: 3.7 mEq/L (ref 3.5–5.1)
Sodium: 140 mEq/L (ref 136–145)
TOTAL PROTEIN: 6.1 g/dL — AB (ref 6.4–8.3)

## 2014-10-10 LAB — CBC WITH DIFFERENTIAL/PLATELET
BASO%: 0.2 % (ref 0.0–2.0)
Basophils Absolute: 0 10*3/uL (ref 0.0–0.1)
EOS ABS: 0 10*3/uL (ref 0.0–0.5)
EOS%: 0.1 % (ref 0.0–7.0)
HCT: 29.8 % — ABNORMAL LOW (ref 34.8–46.6)
HGB: 9 g/dL — ABNORMAL LOW (ref 11.6–15.9)
LYMPH%: 15.4 % (ref 14.0–49.7)
MCH: 29.6 pg (ref 25.1–34.0)
MCHC: 30.2 g/dL — ABNORMAL LOW (ref 31.5–36.0)
MCV: 98 fL (ref 79.5–101.0)
MONO#: 0.8 10*3/uL (ref 0.1–0.9)
MONO%: 8.3 % (ref 0.0–14.0)
NEUT%: 76 % (ref 38.4–76.8)
NEUTROS ABS: 7.3 10*3/uL — AB (ref 1.5–6.5)
PLATELETS: 278 10*3/uL (ref 145–400)
RBC: 3.04 10*6/uL — AB (ref 3.70–5.45)
RDW: 20.6 % — ABNORMAL HIGH (ref 11.2–14.5)
WBC: 9.5 10*3/uL (ref 3.9–10.3)
lymph#: 1.5 10*3/uL (ref 0.9–3.3)

## 2014-10-10 MED ORDER — FOSAPREPITANT DIMEGLUMINE INJECTION 150 MG
150.0000 mg | Freq: Once | INTRAVENOUS | Status: AC
  Administered 2014-10-10: 150 mg via INTRAVENOUS
  Filled 2014-10-10: qty 5

## 2014-10-10 MED ORDER — PALONOSETRON HCL INJECTION 0.25 MG/5ML
INTRAVENOUS | Status: AC
  Filled 2014-10-10: qty 5

## 2014-10-10 MED ORDER — HEPARIN SOD (PORK) LOCK FLUSH 100 UNIT/ML IV SOLN
500.0000 [IU] | Freq: Once | INTRAVENOUS | Status: DC | PRN
  Filled 2014-10-10: qty 5

## 2014-10-10 MED ORDER — DEXAMETHASONE SODIUM PHOSPHATE 20 MG/5ML IJ SOLN
12.0000 mg | Freq: Once | INTRAMUSCULAR | Status: AC
  Administered 2014-10-10: 12 mg via INTRAVENOUS

## 2014-10-10 MED ORDER — PALONOSETRON HCL INJECTION 0.25 MG/5ML
0.2500 mg | Freq: Once | INTRAVENOUS | Status: AC
  Administered 2014-10-10: 0.25 mg via INTRAVENOUS

## 2014-10-10 MED ORDER — SODIUM CHLORIDE 0.9 % IV SOLN
1000.0000 mg/m2 | Freq: Once | INTRAVENOUS | Status: AC
  Administered 2014-10-10: 1710 mg via INTRAVENOUS
  Filled 2014-10-10: qty 44.97

## 2014-10-10 MED ORDER — DEXTROSE-NACL 5-0.45 % IV SOLN
Freq: Once | INTRAVENOUS | Status: AC
  Administered 2014-10-10: 10:00:00 via INTRAVENOUS
  Filled 2014-10-10: qty 10

## 2014-10-10 MED ORDER — SODIUM CHLORIDE 0.9 % IV SOLN
Freq: Once | INTRAVENOUS | Status: AC
  Administered 2014-10-10: 09:00:00 via INTRAVENOUS

## 2014-10-10 MED ORDER — SODIUM CHLORIDE 0.9 % IJ SOLN
10.0000 mL | INTRAMUSCULAR | Status: DC | PRN
  Filled 2014-10-10: qty 10

## 2014-10-10 MED ORDER — SODIUM CHLORIDE 0.9 % IV SOLN
25.0000 mg/m2 | Freq: Once | INTRAVENOUS | Status: AC
  Administered 2014-10-10: 43 mg via INTRAVENOUS
  Filled 2014-10-10: qty 43

## 2014-10-10 MED ORDER — DEXAMETHASONE SODIUM PHOSPHATE 20 MG/5ML IJ SOLN
INTRAMUSCULAR | Status: AC
  Filled 2014-10-10: qty 5

## 2014-10-10 NOTE — Progress Notes (Signed)
Pt voided 600 cc straw colored urine at 0910. Bilateral extremities pitting edema +2. Legs elevated.

## 2014-10-10 NOTE — Patient Instructions (Signed)
[facility] Discharge Instructions for Patients Receiving Chemotherapy  Today you received the following chemotherapy agents Cisplatin and Gemzar  To help prevent nausea and vomiting after your treatment, we encourage you to take your nausea medication as directed If you develop nausea and vomiting that is not controlled by your nausea medication, call the clinic.   BELOW ARE SYMPTOMS THAT SHOULD BE REPORTED IMMEDIATELY:  *FEVER GREATER THAN 100.5 F  *CHILLS WITH OR WITHOUT FEVER  NAUSEA AND VOMITING THAT IS NOT CONTROLLED WITH YOUR NAUSEA MEDICATION  *UNUSUAL SHORTNESS OF BREATH  *UNUSUAL BRUISING OR BLEEDING  TENDERNESS IN MOUTH AND THROAT WITH OR WITHOUT PRESENCE OF ULCERS  *URINARY PROBLEMS  *BOWEL PROBLEMS  UNUSUAL RASH Items with * indicate a potential emergency and should be followed up as soon as possible.  Feel free to call the clinic you have any questions or concerns. The clinic phone number is [phone]. Cisplatin injection What is this medicine? CISPLATIN (SIS pla tin) is a chemotherapy drug. It targets fast dividing cells, like cancer cells, and causes these cells to die. This medicine is used to treat many types of cancer like bladder, ovarian, and testicular cancers. This medicine may be used for other purposes; ask your health care provider or pharmacist if you have questions. COMMON BRAND NAME(S): Platinol, Platinol -AQ What should I tell my health care provider before I take this medicine? They need to know if you have any of these conditions: -blood disorders -hearing problems -kidney disease -recent or ongoing radiation therapy -an unusual or allergic reaction to cisplatin, carboplatin, other chemotherapy, other medicines, foods, dyes, or preservatives -pregnant or trying to get pregnant -breast-feeding How should I use this medicine? This drug is given as an infusion into a vein. It is administered in a hospital or clinic by a  specially trained health care professional. Talk to your pediatrician regarding the use of this medicine in children. Special care may be needed. Overdosage: If you think you have taken too much of this medicine contact a poison control center or emergency room at once. NOTE: This medicine is only for you. Do not share this medicine with others. What if I miss a dose? It is important not to miss a dose. Call your doctor or health care professional if you are unable to keep an appointment. What may interact with this medicine? -dofetilide -foscarnet -medicines for seizures -medicines to increase blood counts like filgrastim, pegfilgrastim, sargramostim -probenecid -pyridoxine used with altretamine -rituximab -some antibiotics like amikacin, gentamicin, neomycin, polymyxin B, streptomycin, tobramycin -sulfinpyrazone -vaccines -zalcitabine Talk to your doctor or health care professional before taking any of these medicines: -acetaminophen -aspirin -ibuprofen -ketoprofen -naproxen This list may not describe all possible interactions. Give your health care provider a list of all the medicines, herbs, non-prescription drugs, or dietary supplements you use. Also tell them if you smoke, drink alcohol, or use illegal drugs. Some items may interact with your medicine. What should I watch for while using this medicine? Your condition will be monitored carefully while you are receiving this medicine. You will need important blood work done while you are taking this medicine. This drug may make you feel generally unwell. This is not uncommon, as chemotherapy can affect healthy cells as well as cancer cells. Report any side effects. Continue your course of treatment even though you feel ill unless your doctor tells you to stop. In some cases, you may be given additional medicines to help with side effects. Follow all directions for  their use. Call your doctor or health care professional for advice if  you get a fever, chills or sore throat, or other symptoms of a cold or flu. Do not treat yourself. This drug decreases your body's ability to fight infections. Try to avoid being around people who are sick. This medicine may increase your risk to bruise or bleed. Call your doctor or health care professional if you notice any unusual bleeding. Be careful brushing and flossing your teeth or using a toothpick because you may get an infection or bleed more easily. If you have any dental work done, tell your dentist you are receiving this medicine. Avoid taking products that contain aspirin, acetaminophen, ibuprofen, naproxen, or ketoprofen unless instructed by your doctor. These medicines may hide a fever. Do not become pregnant while taking this medicine. Women should inform their doctor if they wish to become pregnant or think they might be pregnant. There is a potential for serious side effects to an unborn child. Talk to your health care professional or pharmacist for more information. Do not breast-feed an infant while taking this medicine. Drink fluids as directed while you are taking this medicine. This will help protect your kidneys. Call your doctor or health care professional if you get diarrhea. Do not treat yourself. What side effects may I notice from receiving this medicine? Side effects that you should report to your doctor or health care professional as soon as possible: -allergic reactions like skin rash, itching or hives, swelling of the face, lips, or tongue -signs of infection - fever or chills, cough, sore throat, pain or difficulty passing urine -signs of decreased platelets or bleeding - bruising, pinpoint red spots on the skin, black, tarry stools, nosebleeds -signs of decreased red blood cells - unusually weak or tired, fainting spells, lightheadedness -breathing problems -changes in hearing -gout pain -low blood counts - This drug may decrease the number of white blood cells,  red blood cells and platelets. You may be at increased risk for infections and bleeding. -nausea and vomiting -pain, swelling, redness or irritation at the injection site -pain, tingling, numbness in the hands or feet -problems with balance, movement -trouble passing urine or change in the amount of urine Side effects that usually do not require medical attention (report to your doctor or health care professional if they continue or are bothersome): -changes in vision -loss of appetite -metallic taste in the mouth or changes in taste This list may not describe all possible side effects. Call your doctor for medical advice about side effects. You may report side effects to FDA at 1-800-FDA-1088. Where should I keep my medicine? This drug is given in a hospital or clinic and will not be stored at home. NOTE: This sheet is a summary. It may not cover all possible information. If you have questions about this medicine, talk to your doctor, pharmacist, or health care provider.  2015, [employer] Standard. (2007-12-07 14:40:54)  Gemcitabine injection What is this medicine? GEMCITABINE (jem SIT a been) is a chemotherapy drug. This medicine is used to treat many types of cancer like breast cancer, lung cancer, pancreatic cancer, and ovarian cancer. This medicine may be used for other purposes; ask your health care provider or pharmacist if you have questions. COMMON BRAND NAME(S): Gemzar What should I tell my health care provider before I take this medicine? They need to know if you have any of these conditions: -blood disorders -infection -kidney disease -liver disease -recent or ongoing radiation therapy -an unusual or  allergic reaction to gemcitabine, other chemotherapy, other medicines, foods, dyes, or preservatives -pregnant or trying to get pregnant -breast-feeding How should I use this medicine? This drug is given as an infusion into a vein. It is administered in a hospital or  clinic by a specially trained health care professional. Talk to your pediatrician regarding the use of this medicine in children. Special care may be needed. Overdosage: If you think you have taken too much of this medicine contact a poison control center or emergency room at once. NOTE: This medicine is only for you. Do not share this medicine with others. What if I miss a dose? It is important not to miss your dose. Call your doctor or health care professional if you are unable to keep an appointment. What may interact with this medicine? -medicines to increase blood counts like filgrastim, pegfilgrastim, sargramostim -some other chemotherapy drugs like cisplatin -vaccines Talk to your doctor or health care professional before taking any of these medicines: -acetaminophen -aspirin -ibuprofen -ketoprofen -naproxen This list may not describe all possible interactions. Give your health care provider a list of all the medicines, herbs, non-prescription drugs, or dietary supplements you use. Also tell them if you smoke, drink alcohol, or use illegal drugs. Some items may interact with your medicine. What should I watch for while using this medicine? Visit your doctor for checks on your progress. This drug may make you feel generally unwell. This is not uncommon, as chemotherapy can affect healthy cells as well as cancer cells. Report any side effects. Continue your course of treatment even though you feel ill unless your doctor tells you to stop. In some cases, you may be given additional medicines to help with side effects. Follow all directions for their use. Call your doctor or health care professional for advice if you get a fever, chills or sore throat, or other symptoms of a cold or flu. Do not treat yourself. This drug decreases your body's ability to fight infections. Try to avoid being around people who are sick. This medicine may increase your risk to bruise or bleed. Call your doctor or  health care professional if you notice any unusual bleeding. Be careful brushing and flossing your teeth or using a toothpick because you may get an infection or bleed more easily. If you have any dental work done, tell your dentist you are receiving this medicine. Avoid taking products that contain aspirin, acetaminophen, ibuprofen, naproxen, or ketoprofen unless instructed by your doctor. These medicines may hide a fever. Women should inform their doctor if they wish to become pregnant or think they might be pregnant. There is a potential for serious side effects to an unborn child. Talk to your health care professional or pharmacist for more information. Do not breast-feed an infant while taking this medicine. What side effects may I notice from receiving this medicine? Side effects that you should report to your doctor or health care professional as soon as possible: -allergic reactions like skin rash, itching or hives, swelling of the face, lips, or tongue -low blood counts - this medicine may decrease the number of white blood cells, red blood cells and platelets. You may be at increased risk for infections and bleeding. -signs of infection - fever or chills, cough, sore throat, pain or difficulty passing urine -signs of decreased platelets or bleeding - bruising, pinpoint red spots on the skin, black, tarry stools, blood in the urine -signs of decreased red blood cells - unusually weak or tired, fainting spells,  lightheadedness -breathing problems -chest pain -mouth sores -nausea and vomiting -pain, swelling, redness at site where injected -pain, tingling, numbness in the hands or feet -stomach pain -swelling of ankles, feet, hands -unusual bleeding Side effects that usually do not require medical attention (report to your doctor or health care professional if they continue or are bothersome): -constipation -diarrhea -hair loss -loss of appetite -stomach upset This list may not  describe all possible side effects. Call your doctor for medical advice about side effects. You may report side effects to FDA at 1-800-FDA-1088. Where should I keep my medicine? This drug is given in a hospital or clinic and will not be stored at home. NOTE: This sheet is a summary. It may not cover all possible information. If you have questions about this medicine, talk to your doctor, pharmacist, or health care provider.  2015, [employer] Standard. (2008-01-11 18:45:54)

## 2014-10-11 ENCOUNTER — Ambulatory Visit (HOSPITAL_COMMUNITY)
Admission: RE | Admit: 2014-10-11 | Discharge: 2014-10-11 | Disposition: A | Discharge status: Home / Self Care | Payer: Commercial Managed Care - HMO | Source: Ambulatory Visit | Attending: Hematology | Admitting: Hematology

## 2014-10-11 ENCOUNTER — Encounter (HOSPITAL_COMMUNITY): Payer: Self-pay

## 2014-10-11 ENCOUNTER — Other Ambulatory Visit: Payer: Self-pay | Admitting: Hematology

## 2014-10-11 DIAGNOSIS — C787 Secondary malignant neoplasm of liver and intrahepatic bile duct: Secondary | ICD-10-CM

## 2014-10-11 DIAGNOSIS — C7889 Secondary malignant neoplasm of other digestive organs: Secondary | ICD-10-CM | POA: Diagnosis not present

## 2014-10-11 DIAGNOSIS — F329 Major depressive disorder, single episode, unspecified: Secondary | ICD-10-CM | POA: Insufficient documentation

## 2014-10-11 DIAGNOSIS — E079 Disorder of thyroid, unspecified: Secondary | ICD-10-CM | POA: Diagnosis not present

## 2014-10-11 DIAGNOSIS — C801 Malignant (primary) neoplasm, unspecified: Secondary | ICD-10-CM

## 2014-10-11 DIAGNOSIS — Z87891 Personal history of nicotine dependence: Secondary | ICD-10-CM | POA: Insufficient documentation

## 2014-10-11 DIAGNOSIS — C481 Malignant neoplasm of specified parts of peritoneum: Secondary | ICD-10-CM | POA: Diagnosis not present

## 2014-10-11 LAB — CBC WITH DIFFERENTIAL/PLATELET
BASOS ABS: 0 10*3/uL (ref 0.0–0.1)
BASOS PCT: 0 % (ref 0–1)
EOS PCT: 0 % (ref 0–5)
Eosinophils Absolute: 0 10*3/uL (ref 0.0–0.7)
HCT: 31.1 % — ABNORMAL LOW (ref 36.0–46.0)
Hemoglobin: 9.4 g/dL — ABNORMAL LOW (ref 12.0–15.0)
Lymphocytes Relative: 15 % (ref 12–46)
Lymphs Abs: 1.4 10*3/uL (ref 0.7–4.0)
MCH: 29.8 pg (ref 26.0–34.0)
MCHC: 30.2 g/dL (ref 30.0–36.0)
MCV: 98.7 fL (ref 78.0–100.0)
Monocytes Absolute: 0.8 10*3/uL (ref 0.1–1.0)
Monocytes Relative: 9 % (ref 3–12)
NEUTROS PCT: 76 % (ref 43–77)
Neutro Abs: 7.1 10*3/uL (ref 1.7–7.7)
PLATELETS: 278 10*3/uL (ref 150–400)
RBC: 3.15 MIL/uL — AB (ref 3.87–5.11)
RDW: 19.9 % — ABNORMAL HIGH (ref 11.5–15.5)
WBC: 9.4 10*3/uL (ref 4.0–10.5)

## 2014-10-11 LAB — PROTIME-INR
INR: 1.07 (ref 0.00–1.49)
Prothrombin Time: 14 seconds (ref 11.6–15.2)

## 2014-10-11 LAB — APTT: aPTT: 26 seconds (ref 24–37)

## 2014-10-11 MED ORDER — FENTANYL CITRATE 0.05 MG/ML IJ SOLN
INTRAMUSCULAR | Status: AC | PRN
  Administered 2014-10-11: 50 ug via INTRAVENOUS
  Administered 2014-10-11: 25 ug via INTRAVENOUS

## 2014-10-11 MED ORDER — FENTANYL CITRATE 0.05 MG/ML IJ SOLN
INTRAMUSCULAR | Status: AC
  Filled 2014-10-11: qty 4

## 2014-10-11 MED ORDER — CEFAZOLIN SODIUM-DEXTROSE 2-3 GM-% IV SOLR
2.0000 g | INTRAVENOUS | Status: AC
  Administered 2014-10-11: 2 g via INTRAVENOUS

## 2014-10-11 MED ORDER — SODIUM CHLORIDE 0.9 % IV SOLN
INTRAVENOUS | Status: DC
  Administered 2014-10-11: 13:00:00 via INTRAVENOUS

## 2014-10-11 MED ORDER — LIDOCAINE HCL 1 % IJ SOLN
INTRAMUSCULAR | Status: AC
  Filled 2014-10-11: qty 20

## 2014-10-11 MED ORDER — MIDAZOLAM HCL 2 MG/2ML IJ SOLN
INTRAMUSCULAR | Status: AC | PRN
  Administered 2014-10-11: 1 mg via INTRAVENOUS
  Administered 2014-10-11: 0.5 mg via INTRAVENOUS

## 2014-10-11 MED ORDER — MIDAZOLAM HCL 2 MG/2ML IJ SOLN
INTRAMUSCULAR | Status: AC
  Filled 2014-10-11: qty 6

## 2014-10-11 MED ORDER — HEPARIN SOD (PORK) LOCK FLUSH 100 UNIT/ML IV SOLN
INTRAVENOUS | Status: AC
  Filled 2014-10-11: qty 5

## 2014-10-11 MED ORDER — LIDOCAINE-EPINEPHRINE 2 %-1:100000 IJ SOLN
INTRAMUSCULAR | Status: AC
  Filled 2014-10-11: qty 1

## 2014-10-11 MED ORDER — HEPARIN SOD (PORK) LOCK FLUSH 100 UNIT/ML IV SOLN
INTRAVENOUS | Status: AC | PRN
  Administered 2014-10-11: 500 [IU]

## 2014-10-11 MED ORDER — CEFAZOLIN SODIUM-DEXTROSE 2-3 GM-% IV SOLR
INTRAVENOUS | Status: AC
  Filled 2014-10-11: qty 50

## 2014-10-11 NOTE — Discharge Instructions (Signed)
Implanted Port Insertion, Care After °Refer to this sheet in the next few weeks. These instructions provide you with information on caring for yourself after your procedure. Your health care provider may also give you more specific instructions. Your treatment has been planned according to current medical practices, but problems sometimes occur. Call your health care provider if you have any problems or questions after your procedure. °WHAT TO EXPECT AFTER THE PROCEDURE °After your procedure, it is typical to have the following:  °· Discomfort at the port insertion site. Ice packs to the area will help. °· Bruising on the skin over the port. This will subside in 3-4 days. °HOME CARE INSTRUCTIONS °· After your port is placed, you will get a manufacturer's information card. The card has information about your port. Keep this card with you at all times.   °· Know what kind of port you have. There are many types of ports available.   °· Wear a medical alert bracelet in case of an emergency. This can help alert health care workers that you have a port.   °· The port can stay in for as long as your health care provider believes it is necessary.   °· A home health care nurse may give medicines and take care of the port.   °· You or a family member can get special training and directions for giving medicine and taking care of the port at home.   °SEEK MEDICAL CARE IF:  °· Your port does not flush or you are unable to get a blood return.   °· You have a fever or chills. °SEEK IMMEDIATE MEDICAL CARE IF: °· You have new fluid or pus coming from your incision.   °· You notice a bad smell coming from your incision site.   °· You have swelling, pain, or more redness at the incision or port site.   °· You have chest pain or shortness of breath. °Document Released: 06/22/2013 Document Revised: 09/06/2013 Document Reviewed: 06/22/2013 °ExitCare® Patient Information ©2015 [employer]. This information is not intended to replace  advice given to you by your health care provider. Make sure you discuss any questions you have with your health care provider. °Implanted Port Home Guide °An implanted port is a type of central line that is placed under the skin. Central lines are used to provide IV access when treatment or nutrition needs to be given through a person's veins. Implanted ports are used for long-term IV access. An implanted port may be placed because:  °· You need IV medicine that would be irritating to the small veins in your hands or arms.   °· You need long-term IV medicines, such as antibiotics.   °· You need IV nutrition for a long period.   °· You need frequent blood draws for lab tests.   °· You need dialysis.   °Implanted ports are usually placed in the chest area, but they can also be placed in the upper arm, the abdomen, or the leg. An implanted port has two main parts:  °· Reservoir. The reservoir is round and will appear as a small, raised area under your skin. The reservoir is the part where a needle is inserted to give medicines or draw blood.   °· Catheter. The catheter is a thin, flexible tube that extends from the reservoir. The catheter is placed into a large vein. Medicine that is inserted into the reservoir goes into the catheter and then into the vein.   °HOW WILL I CARE FOR MY INCISION SITE? °Do not get the incision site wet. Bathe or   shower as directed by your health care provider.  °HOW IS MY PORT ACCESSED? °Special steps must be taken to access the port:  °· Before the port is accessed, a numbing cream can be placed on the skin. This helps numb the skin over the port site.   °· Your health care provider uses a sterile technique to access the port. °· Your health care provider must put on a mask and sterile gloves. °· The skin over your port is cleaned carefully with an antiseptic and allowed to dry. °· The port is gently pinched between sterile gloves, and a needle is inserted into the port. °· Only  "non-coring" port needles should be used to access the port. Once the port is accessed, a blood return should be checked. This helps ensure that the port is in the vein and is not clogged.   °· If your port needs to remain accessed for a constant infusion, a clear (transparent) bandage will be placed over the needle site. The bandage and needle will need to be changed every week, or as directed by your health care provider.   °· Keep the bandage covering the needle clean and dry. Do not get it wet. Follow your health care provider's instructions on how to take a shower or bath while the port is accessed.   °· If your port does not need to stay accessed, no bandage is needed over the port.   °WHAT IS FLUSHING? °Flushing helps keep the port from getting clogged. Follow your health care provider's instructions on how and when to flush the port. Ports are usually flushed with saline solution or a medicine called heparin. The need for flushing will depend on how the port is used.  °· If the port is used for intermittent medicines or blood draws, the port will need to be flushed:   °· After medicines have been given.   °· After blood has been drawn.   °· As part of routine maintenance.   °· If a constant infusion is running, the port may not need to be flushed.   °HOW LONG WILL MY PORT STAY IMPLANTED? °The port can stay in for as long as your health care provider thinks it is needed. When it is time for the port to come out, surgery will be done to remove it. The procedure is similar to the one performed when the port was put in.  °WHEN SHOULD I SEEK IMMEDIATE MEDICAL CARE? °When you have an implanted port, you should seek immediate medical care if:  °· You notice a bad smell coming from the incision site.   °· You have swelling, redness, or drainage at the incision site.   °· You have more swelling or pain at the port site or the surrounding area.   °· You have a fever that is not controlled with medicine. °Document  Released: 09/01/2005 Document Revised: 06/22/2013 Document Reviewed: 05/09/2013 °ExitCare® Patient Information ©2015 [employer]. This information is not intended to replace advice given to you by your health care provider. Make sure you discuss any questions you have with your health care provider.Conscious Sedation, Adult, Care After °Refer to this sheet in the next few weeks. These instructions provide you with information on caring for yourself after your procedure. Your health care provider may also give you more specific instructions. Your treatment has been planned according to current medical practices, but problems sometimes occur. Call your health care provider if you have any problems or questions after your procedure. °WHAT TO EXPECT AFTER THE PROCEDURE  °After your procedure: °·   You may feel sleepy, clumsy, and have poor balance for several hours. °· Vomiting may occur if you eat too soon after the procedure. °HOME CARE INSTRUCTIONS °· Do not participate in any activities where you could become injured for at least 24 hours. Do not: °¨ Drive. °¨ Swim. °¨ Ride a bicycle. °¨ Operate heavy machinery. °¨ Cook. °¨ Use power tools. °¨ Climb ladders. °¨ Work from a high place. °· Do not make important decisions or sign legal documents until you are improved. °· If you vomit, drink water, juice, or soup when you can drink without vomiting. Make sure you have little or no nausea before eating solid foods. °· Only take over-the-counter or prescription medicines for pain, discomfort, or fever as directed by your health care provider. °· Make sure you and your family fully understand everything about the medicines given to you, including what side effects may occur. °· You should not drink alcohol, take sleeping pills, or take medicines that cause drowsiness for at least 24 hours. °· If you smoke, do not smoke without supervision. °· If you are feeling better, you may resume normal activities 24 hours after you  were sedated. °· Keep all appointments with your health care provider. °SEEK MEDICAL CARE IF: °· Your skin is pale or bluish in color. °· You continue to feel nauseous or vomit. °· Your pain is getting worse and is not helped by medicine. °· You have bleeding or swelling. °· You are still sleepy or feeling clumsy after 24 hours. °SEEK IMMEDIATE MEDICAL CARE IF: °· You develop a rash. °· You have difficulty breathing. °· You develop any type of allergic problem. °· You have a fever. °MAKE SURE YOU: °· Understand these instructions. °· Will watch your condition. °· Will get help right away if you are not doing well or get worse. °Document Released: 06/22/2013 Document Reviewed: 06/22/2013 °ExitCare® Patient Information ©2015 [employer]. This information is not intended to replace advice given to you by your health care provider. Make sure you discuss any questions you have with your health care provider. ° °

## 2014-10-11 NOTE — Procedures (Signed)
Interventional Radiology Procedure Note  Procedure: Placement of a right IJ approach single lumen PowerPort.  Tip is positioned at the superior cavoatrial junction and catheter is ready for immediate use.  Complications: No immediate Recommendations:  - Ok to shower tomorrow - Do not submerge for 7 days - Routine line care   Signed,  Hoy Fallert K. Taniela Feltus, MD   

## 2014-10-11 NOTE — H&P (Signed)
Chief Complaint: "I am here for a port."  Referring Physician(s): Feng,Yan  History of Present Illness: [name] is a 54 y.o. 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 She has been seen in the office by Dr. Burr Medico on 10/04/14 and scheduled today for image guided port a catheter and paracentesis. The patient has had a H&P performed within the last 30 days, all history, medications, and exam have been reviewed. The patient denies any interval changes since the H&P. She has previously tolerated sedation during her liver biopsy.  Past Medical History  Diagnosis Date  . Mania   . Depression   . Thyroid disease   . Headache(784.0)   . Bipolar disorder   . Adenocarcinoma     Past Surgical History  Procedure Laterality Date  . Tubal ligation    . Dilation and evacuation N/A 08/21/2014    Procedure: DILATATION AND EVACUATION;  Surgeon: Lahoma Crocker, MD;  Location: [facility];  Service: Gynecology;  Laterality: N/A;    Allergies: Buspar; Cymbalta; Hydroxyzine; and Sertraline  Medications: Prior to Admission medications   Medication Sig Start Date End Date Taking? Authorizing Provider  benztropine (COGENTIN) 1 MG tablet Take 1 tablet (1 mg total) by mouth at bedtime. 09/20/14  Yes Levonne Spiller, MD  buPROPion (WELLBUTRIN XL) 300 MG 24 hr tablet Take 1 tablet (300 mg total) by mouth daily. 09/20/14  Yes Levonne Spiller, MD  lactulose (CHRONULAC) 10 GM/15ML solution 15 ml q 4 hr as needed for constipation.  Max of 60 ml per day Patient taking differently: Take 10 g by mouth daily as needed. 30 ml q 4 hr as needed for constipation.  Max of 60 ml per day 07/25/14  Yes [name], MD  levothyroxine (SYNTHROID, LEVOTHROID) 88 MCG tablet Take 88 mcg by mouth at bedtime.  09/22/13  Yes Historical Provider, MD  morphine (MSIR) 15 MG tablet Take 15 mg by mouth every 12 (twelve) hours as needed for moderate pain or severe pain.   Yes Historical  Provider, MD  ondansetron (ZOFRAN) 4 MG tablet Take 1 tablet (4 mg total) by mouth every 6 (six) hours as needed for nausea or vomiting. 08/01/14  Yes [name], MD  OxyCODONE (OXYCONTIN) 10 mg T12A 12 hr tablet Take 1 tablet (10 mg total) by mouth every 8 (eight) hours. 10/04/14  Yes [name], MD  prochlorperazine (COMPAZINE) 10 MG tablet Take 1 tablet (10 mg total) by mouth every 6 (six) hours as needed for nausea or vomiting. 10/04/14  Yes [name], MD  propranolol (INDERAL) 10 MG tablet Take 1 tablet (10 mg total) by mouth 3 (three) times daily. 09/20/14  Yes Levonne Spiller, MD  risperiDONE (RISPERDAL) 3 MG tablet Take 1 tablet (3 mg total) by mouth at bedtime. 09/20/14 09/20/15 Yes Levonne Spiller, MD  zolpidem (AMBIEN) 10 MG tablet Take 1 tablet (10 mg total) by mouth at bedtime as needed for sleep. 09/20/14 01/22/15 Yes Levonne Spiller, MD  dexamethasone (DECADRON) 4 MG tablet Take 1 tablet (4 mg total) by mouth daily. 10/04/14   [name], MD  oxyCODONE (OXY IR/ROXICODONE) 5 MG immediate release tablet Take 1 tablet (5 mg total) by mouth every 4 (four) hours as needed for severe pain. Patient not taking: Reported on 10/06/2014 08/08/14   [name], MD    Family History  Problem Relation Age of Onset  . OCD Mother   . Dementia Father   . Bipolar disorder Sister   .  Drug abuse Sister   . Alcohol abuse Sister   . Anxiety disorder Neg Hx   . Depression Neg Hx   . Paranoid behavior Neg Hx   . Schizophrenia Neg Hx   . Seizures Neg Hx   . Sexual abuse Neg Hx   . Physical abuse Neg Hx   . ADD / ADHD Other     History   Social History  . Marital Status: Married    Spouse Name: N/A    Number of Children: N/A  . Years of Education: N/A   Social History Main Topics  . Smoking status: Former Smoker    Types: Cigarettes    Quit date: 09/15/1989  . Smokeless tobacco: Never Used  . Alcohol Use: No  . Drug Use: No  . Sexual Activity: Not Currently    Birth Control/ Protection: Surgical     Comment: Last  Encounter a few months ago   Other Topics Concern  . None   Social History Narrative   Review of Systems: A 12 point ROS discussed and pertinent positives are indicated in the HPI above.  All other systems are negative.  Review of Systems  Vital Signs: T: 98.78F, HR: 79 bpm, BP: 150/104 mmHg, Resp 20 rpm  Physical Exam  Constitutional: She is oriented to person, place, and time.  HENT:  Head: Normocephalic and atraumatic.  Neck: No tracheal deviation present.  Cardiovascular: Normal rate and regular rhythm.  Exam reveals no gallop and no friction rub.   No murmur heard. Pulmonary/Chest: Effort normal and breath sounds normal. No respiratory distress. She has no wheezes. She has no rales.  Abdominal: Soft. Bowel sounds are normal. She exhibits distension. There is no tenderness.  Neurological: She is alert and oriented to person, place, and time.    Imaging: Ct Chest W Contrast  09/27/2014   CLINICAL DATA:  Pain and swelling. Ongoing chemotherapy. Metastatic adenocarcinoma liver with unknown primary.  EXAM: CT CHEST, ABDOMEN, AND PELVIS WITH CONTRAST  TECHNIQUE: Multidetector CT imaging of the chest, abdomen and pelvis was performed following the standard protocol during bolus administration of intravenous contrast.  CONTRAST:  137mL OMNIPAQUE IOHEXOL 300 MG/ML  SOLN  COMPARISON:  08/07/2014 chest CT. 07/28/2014 abdominal MRI. No prior pelvic CT.  FINDINGS: CT CHEST FINDINGS  Mediastinum/Nodes: Left supraclavicular adenopathy is again suspected. 1.0 cm on image 4. Similar to on the prior exam (when remeasured).  Borderline cardiomegaly. Increased small to moderate pericardial effusion. No central pulmonary embolism, on this non-dedicated study. No mediastinal or hilar adenopathy.  Lungs/Pleura: Persistent right base atelectasis.  Clear left lung.  Small right greater than left pleural effusions are similar to 08/07/2014.  Musculoskeletal: Advanced degenerative disc disease at T10-11.  CT  ABDOMEN PELVIS FINDINGS  Hepatobiliary: Hepatic metastasis. Anterior segment right liver lobe 2.5 cm lesion on image 45 is enlarged from 2.0 cm on 07/28/2014.  Caudate lobe lesion measures 2.6 cm on image 44 versus 2.2 cm on the prior MRI.  Multiple small posterior segment right hepatic lobe lesions have coalesced into a dominant lesion. This measures 7.0 x 5.9 cm, including on image 43. Hepatomegaly is marked at 23.8 cm craniocaudal  Normal gallbladder, without biliary ductal dilatation.  Pancreas: Normal, without mass or ductal dilatation.  Spleen: Normal  Adrenals/Urinary Tract: Normal adrenal glands. Normal right kidney. Too small to characterize interpolar left renal lesion. No hydronephrosis. Normal urinary bladder.  Stomach/Bowel: Small hiatal hernia. The stomach is decompressed. Colonic stool burden suggests constipation. Normal terminal ileum. Normal  small bowel loops.  Vascular/Lymphatic: Normal caliber of the aorta and branch vessels. Necrotic portal caval node measures 1.8 by 2.6 cm on image 55. Compare 1.8 x 2.4 cm on the prior MRI. No pelvic sidewall adenopathy.  Reproductive: Normal uterus and adnexa.  Other: Large volume abdominal pelvic ascites. Slightly increased since 08/07/2014. Diffuse anasarca.  Musculoskeletal: No acute osseous abnormality.  IMPRESSION: CT CHEST IMPRESSION  1. Mild left supraclavicular adenopathy, for which nodal metastasis cannot be excluded. This is grossly similar. 2. Otherwise, no evidence of metastatic disease within the chest. 3. Right greater than left pleural effusions are similar. Similar right base atelectasis. 4. Borderline cardiomegaly with increase in mild to moderate pericardial effusion.  CT ABDOMEN AND PELVIS IMPRESSION  1. Progressive hepatic metastasis with persistent marked hepatomegaly. 2. Similar nodal metastasis in the portal caval space. 3. Increase in large volume abdominal pelvic ascites. 4. Diffuse anasarca.   Electronically Signed   By: Abigail Miyamoto  M.D.   On: 09/27/2014 11:16   Ct Abdomen Pelvis W Contrast  09/27/2014   CLINICAL DATA:  Pain and swelling. Ongoing chemotherapy. Metastatic adenocarcinoma liver with unknown primary.  EXAM: CT CHEST, ABDOMEN, AND PELVIS WITH CONTRAST  TECHNIQUE: Multidetector CT imaging of the chest, abdomen and pelvis was performed following the standard protocol during bolus administration of intravenous contrast.  CONTRAST:  185mL OMNIPAQUE IOHEXOL 300 MG/ML  SOLN  COMPARISON:  08/07/2014 chest CT. 07/28/2014 abdominal MRI. No prior pelvic CT.  FINDINGS: CT CHEST FINDINGS  Mediastinum/Nodes: Left supraclavicular adenopathy is again suspected. 1.0 cm on image 4. Similar to on the prior exam (when remeasured).  Borderline cardiomegaly. Increased small to moderate pericardial effusion. No central pulmonary embolism, on this non-dedicated study. No mediastinal or hilar adenopathy.  Lungs/Pleura: Persistent right base atelectasis.  Clear left lung.  Small right greater than left pleural effusions are similar to 08/07/2014.  Musculoskeletal: Advanced degenerative disc disease at T10-11.  CT ABDOMEN PELVIS FINDINGS  Hepatobiliary: Hepatic metastasis. Anterior segment right liver lobe 2.5 cm lesion on image 45 is enlarged from 2.0 cm on 07/28/2014.  Caudate lobe lesion measures 2.6 cm on image 44 versus 2.2 cm on the prior MRI.  Multiple small posterior segment right hepatic lobe lesions have coalesced into a dominant lesion. This measures 7.0 x 5.9 cm, including on image 43. Hepatomegaly is marked at 23.8 cm craniocaudal  Normal gallbladder, without biliary ductal dilatation.  Pancreas: Normal, without mass or ductal dilatation.  Spleen: Normal  Adrenals/Urinary Tract: Normal adrenal glands. Normal right kidney. Too small to characterize interpolar left renal lesion. No hydronephrosis. Normal urinary bladder.  Stomach/Bowel: Small hiatal hernia. The stomach is decompressed. Colonic stool burden suggests constipation. Normal terminal  ileum. Normal small bowel loops.  Vascular/Lymphatic: Normal caliber of the aorta and branch vessels. Necrotic portal caval node measures 1.8 by 2.6 cm on image 55. Compare 1.8 x 2.4 cm on the prior MRI. No pelvic sidewall adenopathy.  Reproductive: Normal uterus and adnexa.  Other: Large volume abdominal pelvic ascites. Slightly increased since 08/07/2014. Diffuse anasarca.  Musculoskeletal: No acute osseous abnormality.  IMPRESSION: CT CHEST IMPRESSION  1. Mild left supraclavicular adenopathy, for which nodal metastasis cannot be excluded. This is grossly similar. 2. Otherwise, no evidence of metastatic disease within the chest. 3. Right greater than left pleural effusions are similar. Similar right base atelectasis. 4. Borderline cardiomegaly with increase in mild to moderate pericardial effusion.  CT ABDOMEN AND PELVIS IMPRESSION  1. Progressive hepatic metastasis with persistent marked hepatomegaly. 2. Similar  nodal metastasis in the portal caval space. 3. Increase in large volume abdominal pelvic ascites. 4. Diffuse anasarca.   Electronically Signed   By: Abigail Miyamoto M.D.   On: 09/27/2014 11:16    Labs:  CBC:  Recent Labs  09/27/14 0832 10/04/14 0850 10/10/14 0834 10/11/14 1236  WBC 7.0 8.0 9.5 9.4  HGB 8.9* 8.5* 9.0* 9.4*  HCT 30.1* 27.7* 29.8* 31.1*  PLT 198 247 278 278    COAGS:  Recent Labs  08/01/14 1328 08/03/14 1045  INR 1.14 1.15  APTT 30.8 30    BMP:  Recent Labs  07/21/14 1352 07/25/14 1643  09/12/14 0831 09/27/14 0832 10/04/14 0852 10/10/14 0835  NA 141 142  < > 139 139 139 140  K 3.8 4.4  < > 3.8 3.8 3.8 3.7  CL 101 104  --   --   --   --   --   CO2 24 24  < > 30* 31* 26 27  GLUCOSE 96 96  < > 89 74 80 96  BUN 11 23  < > 8.9 6.6* 8.0 7.7  CALCIUM 9.1 8.7  < > 8.1* 8.7 8.3* 8.8  CREATININE 0.67 0.99  < > 0.5* 0.5* 0.5* 0.5*  GFRNONAA >90  --   --   --   --   --   --   GFRAA >90  --   --   --   --   --   --   < > = values in this interval not  displayed.  LIVER FUNCTION TESTS:  Recent Labs  09/12/14 0831 09/27/14 0832 10/04/14 0852 10/10/14 0835  BILITOT 1.00 0.77 0.40 0.58  AST 61* 41* 21 14  ALT 49 30 19 12   ALKPHOS 240* 228* 183* 169*  PROT 5.0* 6.1* 5.5* 6.1*  ALBUMIN 2.0* 2.4* 2.2* 2.6*    TUMOR MARKERS:  Recent Labs  07/25/14 0920 08/01/14 1328 08/18/14 1353 09/27/14 0831 10/04/14 0851  AFPTM 1.9  --   --   --   --   CEA  --  23.2*  --  17.9*  --   CA199  --   --  57710.9* 44818.5* 63149.7*    Assessment and Plan: Metastatic adenocarcinoma likely cholangiocarcinoma primary  Malignant ascites, request for paracentesis Seen in the office by Dr. Burr Medico on 10/04/14  Scheduled today for image guided port a catheter with moderate sedation  Patient has been NPO, no blood thinners taken, afebrile, labs reviewed Risks and Benefits discussed with the patient. All of the patient's questions were answered, patient is agreeable to proceed. Consent signed and in chart.   SignedHedy Jacob 10/11/2014, 1:14 PM

## 2014-10-13 ENCOUNTER — Telehealth: Payer: Self-pay | Admitting: *Deleted

## 2014-10-13 MED ORDER — LIDOCAINE-PRILOCAINE 2.5-2.5 % EX CREA: 1.0000 "application " | TOPICAL_CREAM | CUTANEOUS | Status: AC | PRN

## 2014-10-13 NOTE — Telephone Encounter (Signed)
Received call from pt's daughter requesting emla cream for port.  Script will be e-scribed. Daughter notified.

## 2014-10-16 ENCOUNTER — Ambulatory Visit (HOSPITAL_BASED_OUTPATIENT_CLINIC_OR_DEPARTMENT_OTHER): Payer: Commercial Managed Care - HMO | Admitting: Hematology

## 2014-10-16 ENCOUNTER — Ambulatory Visit (HOSPITAL_BASED_OUTPATIENT_CLINIC_OR_DEPARTMENT_OTHER): Payer: Commercial Managed Care - HMO

## 2014-10-16 ENCOUNTER — Ambulatory Visit: Payer: Commercial Managed Care - HMO

## 2014-10-16 ENCOUNTER — Ambulatory Visit (HOSPITAL_COMMUNITY)
Admission: RE | Admit: 2014-10-16 | Discharge: 2014-10-16 | Disposition: A | Discharge status: Home / Self Care | Payer: Commercial Managed Care - HMO | Source: Ambulatory Visit | Attending: Hematology | Admitting: Hematology

## 2014-10-16 ENCOUNTER — Telehealth: Payer: Self-pay | Admitting: Hematology

## 2014-10-16 ENCOUNTER — Encounter: Payer: Self-pay | Admitting: Hematology

## 2014-10-16 ENCOUNTER — Other Ambulatory Visit (HOSPITAL_BASED_OUTPATIENT_CLINIC_OR_DEPARTMENT_OTHER): Payer: Commercial Managed Care - HMO

## 2014-10-16 ENCOUNTER — Other Ambulatory Visit: Payer: Self-pay | Admitting: *Deleted

## 2014-10-16 VITALS — BP 147/88 | HR 72 | Temp 98.0°F | Resp 18 | Ht 66.0 in | Wt 113.5 lb

## 2014-10-16 VITALS — BP 168/90 | HR 72 | Temp 98.3°F | Resp 18

## 2014-10-16 DIAGNOSIS — C799 Secondary malignant neoplasm of unspecified site: Secondary | ICD-10-CM

## 2014-10-16 DIAGNOSIS — D630 Anemia in neoplastic disease: Secondary | ICD-10-CM

## 2014-10-16 DIAGNOSIS — C801 Malignant (primary) neoplasm, unspecified: Secondary | ICD-10-CM

## 2014-10-16 DIAGNOSIS — R160 Hepatomegaly, not elsewhere classified: Secondary | ICD-10-CM

## 2014-10-16 DIAGNOSIS — R609 Edema, unspecified: Secondary | ICD-10-CM

## 2014-10-16 DIAGNOSIS — C787 Secondary malignant neoplasm of liver and intrahepatic bile duct: Secondary | ICD-10-CM

## 2014-10-16 DIAGNOSIS — D649 Anemia, unspecified: Secondary | ICD-10-CM

## 2014-10-16 DIAGNOSIS — J910 Malignant pleural effusion: Secondary | ICD-10-CM

## 2014-10-16 DIAGNOSIS — E46 Unspecified protein-calorie malnutrition: Secondary | ICD-10-CM

## 2014-10-16 DIAGNOSIS — R109 Unspecified abdominal pain: Secondary | ICD-10-CM

## 2014-10-16 LAB — CBC WITH DIFFERENTIAL/PLATELET
BASO%: 1.5 % (ref 0.0–2.0)
Basophils Absolute: 0.1 10*3/uL (ref 0.0–0.1)
EOS%: 0.2 % (ref 0.0–7.0)
Eosinophils Absolute: 0 10*3/uL (ref 0.0–0.5)
HCT: 26.6 % — ABNORMAL LOW (ref 34.8–46.6)
HGB: 8.3 g/dL — ABNORMAL LOW (ref 11.6–15.9)
LYMPH%: 42.9 % (ref 14.0–49.7)
MCH: 29.7 pg (ref 25.1–34.0)
MCHC: 31.2 g/dL — ABNORMAL LOW (ref 31.5–36.0)
MCV: 95.1 fL (ref 79.5–101.0)
MONO#: 0.2 10*3/uL (ref 0.1–0.9)
MONO%: 4.9 % (ref 0.0–14.0)
NEUT%: 50.5 % (ref 38.4–76.8)
NEUTROS ABS: 2.1 10*3/uL (ref 1.5–6.5)
PLATELETS: 211 10*3/uL (ref 145–400)
RBC: 2.8 10*6/uL — AB (ref 3.70–5.45)
RDW: 18.6 % — ABNORMAL HIGH (ref 11.2–14.5)
WBC: 4.1 10*3/uL (ref 3.9–10.3)
lymph#: 1.8 10*3/uL (ref 0.9–3.3)

## 2014-10-16 LAB — COMPREHENSIVE METABOLIC PANEL (CC13)
ALBUMIN: 2.8 g/dL — AB (ref 3.5–5.0)
ALT: 51 U/L (ref 0–55)
AST: 47 U/L — AB (ref 5–34)
Alkaline Phosphatase: 159 U/L — ABNORMAL HIGH (ref 40–150)
Anion Gap: 11 mEq/L (ref 3–11)
BUN: 9.3 mg/dL (ref 7.0–26.0)
CALCIUM: 8.7 mg/dL (ref 8.4–10.4)
CO2: 25 mEq/L (ref 22–29)
CREATININE: 0.5 mg/dL — AB (ref 0.6–1.1)
Chloride: 104 mEq/L (ref 98–109)
EGFR: >90 mL/min/{1.73_m2} (ref >90)
Glucose: 90 mg/dl (ref 70–140)
Potassium: 4 mEq/L (ref 3.5–5.1)
SODIUM: 140 meq/L (ref 136–145)
TOTAL PROTEIN: 6.1 g/dL — AB (ref 6.4–8.3)
Total Bilirubin: 0.31 mg/dL (ref 0.20–1.20)

## 2014-10-16 LAB — MAGNESIUM (CC13): Magnesium: 1.9 mg/dl (ref 1.5–2.5)

## 2014-10-16 MED ORDER — ACETAMINOPHEN 325 MG PO TABS
ORAL_TABLET | ORAL | Status: AC
  Filled 2014-10-16: qty 2

## 2014-10-16 MED ORDER — SODIUM CHLORIDE 0.9 % IJ SOLN
10.0000 mL | INTRAMUSCULAR | Status: AC | PRN
  Administered 2014-10-16: 10 mL
  Filled 2014-10-16: qty 10

## 2014-10-16 MED ORDER — DIPHENHYDRAMINE HCL 25 MG PO CAPS
ORAL_CAPSULE | ORAL | Status: AC
  Filled 2014-10-16: qty 1

## 2014-10-16 MED ORDER — HEPARIN SOD (PORK) LOCK FLUSH 100 UNIT/ML IV SOLN
500.0000 [IU] | Freq: Every day | INTRAVENOUS | Status: AC | PRN
  Administered 2014-10-16: 500 [IU]
  Filled 2014-10-16: qty 5

## 2014-10-16 MED ORDER — ACETAMINOPHEN 325 MG PO TABS
650.0000 mg | ORAL_TABLET | Freq: Once | ORAL | Status: AC
  Administered 2014-10-16: 650 mg via ORAL

## 2014-10-16 MED ORDER — DIPHENHYDRAMINE HCL 25 MG PO CAPS
25.0000 mg | ORAL_CAPSULE | Freq: Once | ORAL | Status: AC
  Administered 2014-10-16: 25 mg via ORAL

## 2014-10-16 MED ORDER — SODIUM CHLORIDE 0.9 % IV SOLN
250.0000 mL | Freq: Once | INTRAVENOUS | Status: AC
  Administered 2014-10-16: 250 mL via INTRAVENOUS

## 2014-10-16 MED ORDER — OXYCODONE HCL 5 MG PO TABS: 5.0000 mg | ORAL_TABLET | ORAL | Status: DC | PRN

## 2014-10-16 NOTE — Progress Notes (Signed)
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 Hepatosplenomegaly. Abdominal and pelvic ascites and Pathologically enlarged upper abdominal. CT chest (-).    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 Bipolar 2. Hypothyroidism   INTERVAL HISTORY: [name] returns for follow-up. She tolerated cis and gem first treatment well, no significant nausea or vomiting. She had port placement. She has good appetite and eats well. No fever or chills. Abdominal distention improved lately.   REVIEW OF SYSTEMS:   Constitutional: Denies fevers, chills, (+) weight loss and fatigued  Eyes: Denies blurriness of vision Ears, nose, mouth, throat, and face: Denies mucositis or sore throat Respiratory: Denies cough, dyspnea or wheezes Cardiovascular: Denies palpitation, chest discomfort or lower extremity swelling Gastrointestinal:  (+) nausea better with Zofran, no heartburn or change in bowel habits Skin: Denies abnormal skin rashes. (+) b/l leg swelling  Lymphatics: Denies new lymphadenopathy or easy bruising Neurological:Denies numbness, tingling or new weaknesses Behavioral/Psych: Mood is stable, no new changes  All other systems were reviewed with the patient and are negative.  I have reviewed the past medical history, past surgical history, social history and family history with the patient and they are unchanged from previous note.  ALLERGIES:  is allergic to buspar; cymbalta; hydroxyzine; and sertraline.  MEDICATIONS:  Current Outpatient Prescriptions   Medication Sig Dispense Refill  . benztropine (COGENTIN) 1 MG tablet Take 1 tablet (1 mg total) by mouth at bedtime. 90 tablet 2  . buPROPion (WELLBUTRIN XL) 300 MG 24 hr tablet Take 1 tablet (300 mg total) by mouth daily. 90 tablet 2  . dexamethasone (DECADRON) 4 MG tablet Take 1 tablet (4 mg total) by mouth daily. 20 tablet 2  . lactulose (CHRONULAC) 10 GM/15ML solution 15 ml q 4 hr as needed for constipation.  Max of 60 ml per day (Patient taking differently: Take 10 g by mouth daily as needed. 30 ml q 4 hr as needed for constipation.  Max of 60 ml per day) 500 mL 2  . levothyroxine (SYNTHROID, LEVOTHROID) 88 MCG tablet Take 88 mcg by mouth at bedtime.     . lidocaine-prilocaine (EMLA) cream Apply 1 application topically as needed. Apply to port 1 hr before treatment as instructed. 30 g 0  . morphine (MSIR) 15 MG tablet Take 15 mg by mouth every 12 (twelve) hours as needed for moderate pain or severe pain.    Marland Kitchen ondansetron (ZOFRAN) 4 MG tablet Take 1 tablet (4 mg total) by mouth every 6 (six) hours as needed for nausea or vomiting. 60 tablet 4  . oxyCODONE (OXY IR/ROXICODONE) 5 MG immediate release tablet Take 1 tablet (5 mg total) by mouth every 4 (four) hours as needed for severe pain. 120 tablet 0  . prochlorperazine (COMPAZINE) 10 MG tablet Take 1 tablet (10 mg total) by mouth every 6 (six) hours as needed for nausea or vomiting. 30 tablet 2  . propranolol (INDERAL) 10 MG tablet Take 1 tablet (10 mg total) by mouth 3 (three) times daily. 270 tablet 2  . risperiDONE (RISPERDAL) 3 MG tablet Take 1 tablet (3 mg total)  by mouth at bedtime. 90 tablet 2  . zolpidem (AMBIEN) 10 MG tablet Take 1 tablet (10 mg total) by mouth at bedtime as needed for sleep. 90 tablet 1   No current facility-administered medications for this visit.    PHYSICAL EXAMINATION: ECOG PERFORMANCE STATUS: 2   Filed Vitals:   10/16/14 1056  BP: 147/88  Pulse: 72  Temp: 98 F (36.7 C)  Resp: 18   Filed Weights    10/16/14 1056  Weight: 113 lb 8 oz (51.483 kg)    GENERAL:alert, no distress and comfortable SKIN: skin color, texture, turgor are normal, no rashes or significant lesions EYES: normal, Conjunctiva are pink and non-injected, sclera clear OROPHARYNX:no exudate, no erythema and lips, buccal mucosa, and tongue normal  NECK: supple, thyroid normal size, non-tender, without nodularity LYMPH:  no palpable lymphadenopathy in the cervical, axillary or inguinal LUNGS: clear to auscultation and percussion with normal breathing effort HEART: regular rate & rhythm and no murmurs and no lower extremity edema ABDOMEN:abdomen soft,  and normal bowel sounds. (+) distended, (+) hepatomegaly, firm, palpable 10cm under rib cage  Musculoskeletal:no cyanosis of digits and no clubbing  NEURO: alert & oriented x 3 with fluent speech, no focal motor/sensory deficits  LABORATORY DATA:  I have reviewed the data as listed  CBC Latest Ref Rng 10/16/2014 10/11/2014 10/10/2014  WBC 3.9 - 10.3 10e3/uL 4.1 9.4 9.5  Hemoglobin 11.6 - 15.9 g/dL 8.3(L) 9.4(L) 9.0(L)  Hematocrit 34.8 - 46.6 % 26.6(L) 31.1(L) 29.8(L)  Platelets 145 - 400 10e3/uL 211 278 278    CMP Latest Ref Rng 10/16/2014 10/10/2014 10/04/2014  Glucose 70 - 140 mg/dl 90 96 80  BUN 7.0 - 26.0 mg/dL 9.3 7.7 8.0  Creatinine 0.6 - 1.1 mg/dL 0.5(L) 0.5(L) 0.5(L)  Sodium 136 - 145 mEq/L 140 140 139  Potassium 3.5 - 5.1 mEq/L 4.0 3.7 3.8  Chloride 96 - 112 mEq/L - - -  CO2 22 - 29 mEq/L 25 27 26   Calcium 8.4 - 10.4 mg/dL 8.7 8.8 8.3(L)  Total Protein 6.4 - 8.3 g/dL 6.1(L) 6.1(L) 5.5(L)  Total Bilirubin 0.20 - 1.20 mg/dL 0.31 0.58 0.40  Alkaline Phos 40 - 150 U/L 159(H) 169(H) 183(H)  AST 5 - 34 U/L 47(H) 14 21  ALT 0 - 55 U/L 51 12 19      Surgical path 08/03/14 - ADENOCARCINOMA. Microscopic Comment The core biopsies are extensively involved by adenocarcinoma with focal goblet cells and foci with signet ring morphology. Immunohistochemistry will be  performed and reported as an addendum. (JDP:gt, 08/04/14) Addendum: Immunohistochemistry is performed and the tumor is positive with Cytokeratin 7 and negative with Cytokeratin 20, CDX2, thyroid transcription factor-1, Napsin A, estrogen receptor, progesterone receptor and gross cystic disease fluid protein. The immunophenotype is nonspecific.  RADIOGRAPHIC STUDIES: I have personally reviewed the radiological images as listed and agreed with the findings in the report.  CT chest 09/27/2014 1. Mild left supraclavicular adenopathy, for which nodal metastasis cannot be excluded. This is grossly similar. 2. Otherwise, no evidence of metastatic disease within the chest. 3. Right greater than left pleural effusions are similar. Similar right base atelectasis. 4. Borderline cardiomegaly with increase in mild to moderate pericardial effusion  CT abdomen and pelvis 09/27/2014 1. Progressive hepatic metastasis with persistent marked hepatomegaly. 2. Similar nodal metastasis in the portal caval space. 3. Increase in large volume abdominal pelvic ascites. 4. Diffuse anasarca.  I have reviewed her EGD and colonoscopy reports which was done on 11/20, which was negative  for ulceration or mass.   ASSESSMENT & PLAN:  55 yo female with PMH of bipolar but otherwise healthy, no history of liver disease or alcohol abuse, no history of blood transfusion, who presented with significant N/V, abdominal pain and bloating, New discovered multiple liver masses and small ascites, and elevated serum beta HCG, CA125 and CEA.   1. 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 -The patient and her family understand the overall poor prognosis, and his treatment goal is palliation. - Her case was reviewed in our GI tumor Board. The radiologist feels her liver lesions are most consistent with metastatic lesions, not typical for cholangiocarcinoma, but  pathology morphology favors cholangiocarcinoma or pancreatic cancer. CA19.9 57,711. 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  -her recent restaging CT showed disease progression in her liver and worsening ascites. She is on second line cisplatin and gemcitabine. Tolerated well. Posttreatment, we'll proceed C1D8 today.  2. Anemia, probably related to her underline malignancy and anemia -Her hemoglobIs 8.3 today, we'll give 2 units of RBC  3. Bilateral leg edema -I checked Doppler ultrasound on her legs which showed no evidence of DVT. -this is likely related to her hypoalbuminemia, and the poor venous return. I encouraged her to elevate her legs and the use compression stocks. I would not use Lasix due to her poor by mouth intake.  4. malignant ascites -If it gets worse, we'll arrange IR for paracentesis for symptom relief.   5. Malnutrition and deconditioning secondary to her probably underline malignancy  -I encouraged her to take nutrition supplement, and continue home PT.   6. Bipolar -stable mood, continue meds  7. Abdominal pain -Stable. She still has very high co-pay for OxyContin and she could not feel it. Her pain seemed controlled by oxycodone, I'll refill her oxycodone today.  Plan -Cycle 1 day 8 chemotherapy tomorrow. I'll arrange some of her chemotherapy at [facility] in the future for her convenience. -1 unit RBCs today, and a 1 more unit RBCs tomorrow -I reviewed her oxycodone today. -Return to clinic in 2 weeks before cycle 2.   All questions were answered. The patient knows to call the clinic with any problems, questions or concerns. No barriers to learning was detected. I spent 20 minutes counseling the patient face to face. The total time spent in the appointment was 25 minutes and more than 50% was on counseling and review of test results     [name], MD 10/16/2014

## 2014-10-16 NOTE — Telephone Encounter (Signed)
gv adn printed appt sched and avs for pt for Feb....sed added tx.   °

## 2014-10-16 NOTE — Patient Instructions (Signed)

## 2014-10-17 ENCOUNTER — Ambulatory Visit (HOSPITAL_BASED_OUTPATIENT_CLINIC_OR_DEPARTMENT_OTHER): Payer: Commercial Managed Care - HMO

## 2014-10-17 VITALS — BP 154/84 | HR 64 | Temp 97.4°F | Resp 18

## 2014-10-17 DIAGNOSIS — C801 Malignant (primary) neoplasm, unspecified: Secondary | ICD-10-CM | POA: Diagnosis not present

## 2014-10-17 DIAGNOSIS — C799 Secondary malignant neoplasm of unspecified site: Secondary | ICD-10-CM

## 2014-10-17 DIAGNOSIS — C787 Secondary malignant neoplasm of liver and intrahepatic bile duct: Secondary | ICD-10-CM

## 2014-10-17 DIAGNOSIS — Z5111 Encounter for antineoplastic chemotherapy: Secondary | ICD-10-CM

## 2014-10-17 DIAGNOSIS — R160 Hepatomegaly, not elsewhere classified: Secondary | ICD-10-CM

## 2014-10-17 DIAGNOSIS — D630 Anemia in neoplastic disease: Secondary | ICD-10-CM

## 2014-10-17 LAB — PREPARE RBC (CROSSMATCH)

## 2014-10-17 MED ORDER — HEPARIN SOD (PORK) LOCK FLUSH 100 UNIT/ML IV SOLN
500.0000 [IU] | Freq: Every day | INTRAVENOUS | Status: DC | PRN
  Filled 2014-10-17: qty 5

## 2014-10-17 MED ORDER — SODIUM CHLORIDE 0.9 % IJ SOLN
10.0000 mL | INTRAMUSCULAR | Status: DC | PRN
  Filled 2014-10-17: qty 10

## 2014-10-17 MED ORDER — PALONOSETRON HCL INJECTION 0.25 MG/5ML
INTRAVENOUS | Status: AC
  Filled 2014-10-17: qty 5

## 2014-10-17 MED ORDER — DIPHENHYDRAMINE HCL 25 MG PO CAPS
ORAL_CAPSULE | ORAL | Status: AC
  Filled 2014-10-17: qty 1

## 2014-10-17 MED ORDER — SODIUM CHLORIDE 0.9 % IV SOLN
25.0000 mg/m2 | Freq: Once | INTRAVENOUS | Status: AC
  Administered 2014-10-17: 43 mg via INTRAVENOUS
  Filled 2014-10-17: qty 43

## 2014-10-17 MED ORDER — SODIUM CHLORIDE 0.9 % IV SOLN
150.0000 mg | Freq: Once | INTRAVENOUS | Status: AC
  Administered 2014-10-17: 150 mg via INTRAVENOUS
  Filled 2014-10-17: qty 5

## 2014-10-17 MED ORDER — SODIUM CHLORIDE 0.9 % IV SOLN
250.0000 mL | Freq: Once | INTRAVENOUS | Status: AC
  Administered 2014-10-17: 250 mL via INTRAVENOUS

## 2014-10-17 MED ORDER — PALONOSETRON HCL INJECTION 0.25 MG/5ML
0.2500 mg | Freq: Once | INTRAVENOUS | Status: AC
  Administered 2014-10-17: 0.25 mg via INTRAVENOUS

## 2014-10-17 MED ORDER — SODIUM CHLORIDE 0.9 % IJ SOLN
3.0000 mL | INTRAMUSCULAR | Status: DC | PRN
  Filled 2014-10-17: qty 10

## 2014-10-17 MED ORDER — ACETAMINOPHEN 325 MG PO TABS
ORAL_TABLET | ORAL | Status: AC
  Filled 2014-10-17: qty 2

## 2014-10-17 MED ORDER — DEXAMETHASONE SODIUM PHOSPHATE 20 MG/5ML IJ SOLN
12.0000 mg | Freq: Once | INTRAMUSCULAR | Status: AC
  Administered 2014-10-17: 12 mg via INTRAVENOUS

## 2014-10-17 MED ORDER — ACETAMINOPHEN 325 MG PO TABS
650.0000 mg | ORAL_TABLET | Freq: Once | ORAL | Status: AC
  Administered 2014-10-17: 650 mg via ORAL

## 2014-10-17 MED ORDER — POTASSIUM CHLORIDE 2 MEQ/ML IV SOLN
Freq: Once | INTRAVENOUS | Status: AC
  Administered 2014-10-17: 11:00:00 via INTRAVENOUS
  Filled 2014-10-17: qty 10

## 2014-10-17 MED ORDER — DIPHENHYDRAMINE HCL 25 MG PO CAPS
25.0000 mg | ORAL_CAPSULE | Freq: Once | ORAL | Status: AC
  Administered 2014-10-17: 25 mg via ORAL

## 2014-10-17 MED ORDER — HEPARIN SOD (PORK) LOCK FLUSH 100 UNIT/ML IV SOLN
250.0000 [IU] | INTRAVENOUS | Status: DC | PRN
  Filled 2014-10-17: qty 5

## 2014-10-17 MED ORDER — SODIUM CHLORIDE 0.9 % IV SOLN
1000.0000 mg/m2 | Freq: Once | INTRAVENOUS | Status: AC
  Administered 2014-10-17: 1710 mg via INTRAVENOUS
  Filled 2014-10-17: qty 44.97

## 2014-10-17 MED ORDER — DEXAMETHASONE SODIUM PHOSPHATE 20 MG/5ML IJ SOLN
INTRAMUSCULAR | Status: AC
  Filled 2014-10-17: qty 5

## 2014-10-17 NOTE — Addendum Note (Signed)
Addended by: [name] on: 10/17/2014 07:53 AM   Modules accepted: Orders, SmartSet

## 2014-10-17 NOTE — Patient Instructions (Addendum)
[facility] Discharge Instructions for Patients Receiving Chemotherapy  Today you received the following chemotherapy agents Cisplatin, Gemzar To help prevent nausea and vomiting after your treatment, we encourage you to take your nausea medication Compazine   If you develop nausea and vomiting that is not controlled by your nausea medication, call the clinic.   BELOW ARE SYMPTOMS THAT SHOULD BE REPORTED IMMEDIATELY:  *FEVER GREATER THAN 100.5 F  *CHILLS WITH OR WITHOUT FEVER  NAUSEA AND VOMITING THAT IS NOT CONTROLLED WITH YOUR NAUSEA MEDICATION  *UNUSUAL SHORTNESS OF BREATH  *UNUSUAL BRUISING OR BLEEDING  TENDERNESS IN MOUTH AND THROAT WITH OR WITHOUT PRESENCE OF ULCERS  *URINARY PROBLEMS  *BOWEL PROBLEMS  UNUSUAL RASH Items with * indicate a potential emergency and should be followed up as soon as possible.  Feel free to call the clinic you have any questions or concerns. The clinic phone number is [phone].   Blood Transfusion Information WHAT IS A BLOOD TRANSFUSION? A transfusion is the replacement of blood or some of its parts. Blood is made up of multiple cells which provide different functions.  Red blood cells carry oxygen and are used for blood loss replacement.  White blood cells fight against infection.  Platelets control bleeding.  Plasma helps clot blood.  Other blood products are available for specialized needs, such as hemophilia or other clotting disorders. BEFORE THE TRANSFUSION  Who gives blood for transfusions?   You may be able to donate blood to be used at a later date on yourself (autologous donation).  Relatives can be asked to donate blood. This is generally not any safer than if you have received blood from a stranger. The same precautions are taken to ensure safety when a relative's blood is donated.  Healthy volunteers who are fully evaluated to make sure their blood is safe. This is blood bank blood. Transfusion  therapy is the safest it has ever been in the practice of medicine. Before blood is taken from a donor, a complete history is taken to make sure that person has no history of diseases nor engages in risky social behavior (examples are intravenous drug use or sexual activity with multiple partners). The donor's travel history is screened to minimize risk of transmitting infections, such as malaria. The donated blood is tested for signs of infectious diseases, such as HIV and hepatitis. The blood is then tested to be sure it is compatible with you in order to minimize the chance of a transfusion reaction. If you or a relative donates blood, this is often done in anticipation of surgery and is not appropriate for emergency situations. It takes many days to process the donated blood. RISKS AND COMPLICATIONS Although transfusion therapy is very safe and saves many lives, the main dangers of transfusion include:   Getting an infectious disease.  Developing a transfusion reaction. This is an allergic reaction to something in the blood you were given. Every precaution is taken to prevent this. The decision to have a blood transfusion has been considered carefully by your caregiver before blood is given. Blood is not given unless the benefits outweigh the risks. AFTER THE TRANSFUSION  Right after receiving a blood transfusion, you will usually feel much better and more energetic. This is especially true if your red blood cells have gotten low (anemic). The transfusion raises the level of the red blood cells which carry oxygen, and this usually causes an energy increase.  The nurse administering the transfusion will monitor you carefully for  complications. HOME CARE INSTRUCTIONS  No special instructions are needed after a transfusion. You may find your energy is better. Speak with your caregiver about any limitations on activity for underlying diseases you may have. SEEK MEDICAL CARE IF:   Your condition is  not improving after your transfusion.  You develop redness or irritation at the intravenous (IV) site. SEEK IMMEDIATE MEDICAL CARE IF:  Any of the following symptoms occur over the next 12 hours:  Shaking chills.  You have a temperature by mouth above 102 F (38.9 C), not controlled by medicine.  Chest, back, or muscle pain.  People around you feel you are not acting correctly or are confused.  Shortness of breath or difficulty breathing.  Dizziness and fainting.  You get a rash or develop hives.  You have a decrease in urine output.  Your urine turns a dark color or changes to pink, red, or brown. Any of the following symptoms occur over the next 10 days:  You have a temperature by mouth above 102 F (38.9 C), not controlled by medicine.  Shortness of breath.  Weakness after normal activity.  The white part of the eye turns yellow (jaundice).  You have a decrease in the amount of urine or are urinating less often.  Your urine turns a dark color or changes to pink, red, or brown. Document Released: 08/29/2000 Document Revised: 11/24/2011 Document Reviewed: 04/17/2008 [facility] Patient Information 2015 [place], [place]. This information is not intended to replace advice given to you by your health care provider. Make sure you discuss any questions you have with your health care provider.

## 2014-10-18 ENCOUNTER — Telehealth: Payer: Self-pay | Admitting: Certified Registered Nurse Anesthetist

## 2014-10-18 ENCOUNTER — Telehealth: Payer: Self-pay | Admitting: *Deleted

## 2014-10-18 ENCOUNTER — Ambulatory Visit (HOSPITAL_BASED_OUTPATIENT_CLINIC_OR_DEPARTMENT_OTHER): Payer: Commercial Managed Care - HMO

## 2014-10-18 ENCOUNTER — Other Ambulatory Visit: Payer: Self-pay | Admitting: *Deleted

## 2014-10-18 VITALS — BP 141/83 | HR 84 | Temp 98.2°F

## 2014-10-18 DIAGNOSIS — C801 Malignant (primary) neoplasm, unspecified: Secondary | ICD-10-CM

## 2014-10-18 DIAGNOSIS — C787 Secondary malignant neoplasm of liver and intrahepatic bile duct: Secondary | ICD-10-CM

## 2014-10-18 DIAGNOSIS — Z5189 Encounter for other specified aftercare: Secondary | ICD-10-CM

## 2014-10-18 LAB — TYPE AND SCREEN
ABO/RH(D): O POS
Antibody Screen: NEGATIVE
UNIT DIVISION: 0
Unit division: 0

## 2014-10-18 MED ORDER — FILGRASTIM 300 MCG/0.5ML IJ SOSY: 300.0000 ug | PREFILLED_SYRINGE | Freq: Once | INTRAMUSCULAR | Status: DC

## 2014-10-18 MED ORDER — TBO-FILGRASTIM 300 MCG/0.5ML ~~LOC~~ SOSY: 300.0000 ug | PREFILLED_SYRINGE | Freq: Once | SUBCUTANEOUS | Status: DC

## 2014-10-18 MED ORDER — TBO-FILGRASTIM 300 MCG/0.5ML ~~LOC~~ SOSY
300.0000 ug | PREFILLED_SYRINGE | Freq: Once | SUBCUTANEOUS | Status: DC
  Filled 2014-10-18: qty 0.5

## 2014-10-18 MED ORDER — TBO-FILGRASTIM 300 MCG/0.5ML ~~LOC~~ SOSY
300.0000 ug | PREFILLED_SYRINGE | Freq: Once | SUBCUTANEOUS | Status: AC
Start: 2014-10-18 — End: 2014-10-18
  Administered 2014-10-18: 300 ug via SUBCUTANEOUS
  Filled 2014-10-18: qty 0.5

## 2014-10-18 NOTE — Telephone Encounter (Addendum)
Left patient a message to f/u on chemo from yesterday.  Patient will have a 4pm appt. Today.  Informed pt. That I will also check on her when she arrives at [facility].  HL  10/19/2013: Able to call patient today. She is feeling pretty well. Had mild nausea 2 days ago, taken zofran. Did not require anything yesterday. Mild decrease in appetite, able to keep food down.  Denied other issues except for mild constipation.  Bowel regimen reviewed re:use of OTC medication for constipation and increase fluid intake.  Pt. Instructed to call [phone] if she has further questions or issues. HL

## 2014-10-18 NOTE — Addendum Note (Signed)
Addended by: [name] on: 10/18/2014 03:42 PM   Modules accepted: Orders

## 2014-10-18 NOTE — Telephone Encounter (Signed)
Per pharmacy I have scheduled appts and called patient

## 2014-10-18 NOTE — Patient Instructions (Signed)

## 2014-10-18 NOTE — Addendum Note (Signed)
Addended by: Margaret Pyle on: 10/18/2014 04:08 PM   Modules accepted: Orders

## 2014-10-19 NOTE — Telephone Encounter (Signed)
-----   Message from Cora Collum, RN sent at 10/17/2014 12:38 PM EST ----- Regarding: Chemo follow up call Contact: [phone] !st time Cisplatin and Gemzar. Dr Burr Medico

## 2014-10-26 ENCOUNTER — Ambulatory Visit: Payer: Self-pay | Admitting: Gynecologic Oncology

## 2014-10-30 ENCOUNTER — Telehealth: Payer: Self-pay | Admitting: Hematology

## 2014-10-30 NOTE — Telephone Encounter (Signed)
Confirmed appointment for 02/23.

## 2014-10-31 ENCOUNTER — Ambulatory Visit: Payer: Self-pay | Admitting: Hematology

## 2014-10-31 ENCOUNTER — Ambulatory Visit: Payer: Self-pay

## 2014-10-31 ENCOUNTER — Other Ambulatory Visit: Payer: Self-pay

## 2014-11-02 ENCOUNTER — Telehealth: Payer: Self-pay | Admitting: Hematology

## 2014-11-02 ENCOUNTER — Ambulatory Visit (HOSPITAL_BASED_OUTPATIENT_CLINIC_OR_DEPARTMENT_OTHER): Payer: Commercial Managed Care - HMO | Admitting: Hematology

## 2014-11-02 ENCOUNTER — Ambulatory Visit (HOSPITAL_BASED_OUTPATIENT_CLINIC_OR_DEPARTMENT_OTHER): Payer: Commercial Managed Care - HMO

## 2014-11-02 VITALS — BP 171/90 | HR 70 | Resp 18 | Ht 66.0 in | Wt 114.8 lb

## 2014-11-02 DIAGNOSIS — D649 Anemia, unspecified: Secondary | ICD-10-CM

## 2014-11-02 DIAGNOSIS — C801 Malignant (primary) neoplasm, unspecified: Secondary | ICD-10-CM

## 2014-11-02 DIAGNOSIS — R180 Malignant ascites: Secondary | ICD-10-CM

## 2014-11-02 DIAGNOSIS — R319 Hematuria, unspecified: Secondary | ICD-10-CM

## 2014-11-02 DIAGNOSIS — C787 Secondary malignant neoplasm of liver and intrahepatic bile duct: Secondary | ICD-10-CM

## 2014-11-02 DIAGNOSIS — Z5111 Encounter for antineoplastic chemotherapy: Secondary | ICD-10-CM

## 2014-11-02 DIAGNOSIS — R600 Localized edema: Secondary | ICD-10-CM

## 2014-11-02 DIAGNOSIS — R109 Unspecified abdominal pain: Secondary | ICD-10-CM

## 2014-11-02 DIAGNOSIS — E46 Unspecified protein-calorie malnutrition: Secondary | ICD-10-CM

## 2014-11-02 LAB — COMPREHENSIVE METABOLIC PANEL (CC13)
ALK PHOS: 135 U/L (ref 40–150)
ALT: 19 U/L (ref 0–55)
ANION GAP: 10 meq/L (ref 3–11)
AST: 19 U/L (ref 5–34)
Albumin: 3.7 g/dL (ref 3.5–5.0)
BUN: 13.7 mg/dL (ref 7.0–26.0)
CALCIUM: 9.6 mg/dL (ref 8.4–10.4)
CO2: 28 mEq/L (ref 22–29)
Chloride: 102 mEq/L (ref 98–109)
Creatinine: 0.7 mg/dL (ref 0.6–1.1)
EGFR: >90 mL/min/{1.73_m2} (ref >90)
GLUCOSE: 84 mg/dL (ref 70–140)
Potassium: 4.1 mEq/L (ref 3.5–5.1)
Sodium: 140 mEq/L (ref 136–145)
Total Bilirubin: 0.46 mg/dL (ref 0.20–1.20)
Total Protein: 7.1 g/dL (ref 6.4–8.3)

## 2014-11-02 LAB — CBC WITH DIFFERENTIAL/PLATELET
BASO%: 0.3 % (ref 0.0–2.0)
Basophils Absolute: 0 10*3/uL (ref 0.0–0.1)
EOS ABS: 0 10*3/uL (ref 0.0–0.5)
EOS%: 0.2 % (ref 0.0–7.0)
HCT: 38.7 % (ref 34.8–46.6)
HEMOGLOBIN: 11.9 g/dL (ref 11.6–15.9)
LYMPH#: 1.6 10*3/uL (ref 0.9–3.3)
LYMPH%: 26.8 % (ref 14.0–49.7)
MCH: 31 pg (ref 25.1–34.0)
MCHC: 30.7 g/dL — ABNORMAL LOW (ref 31.5–36.0)
MCV: 100.8 fL (ref 79.5–101.0)
MONO#: 1 10*3/uL — AB (ref 0.1–0.9)
MONO%: 16 % — ABNORMAL HIGH (ref 0.0–14.0)
NEUT%: 56.7 % (ref 38.4–76.8)
NEUTROS ABS: 3.5 10*3/uL (ref 1.5–6.5)
PLATELETS: 490 10*3/uL — AB (ref 145–400)
RBC: 3.84 10*6/uL (ref 3.70–5.45)
RDW: 17.4 % — ABNORMAL HIGH (ref 11.2–14.5)
WBC: 6.1 10*3/uL (ref 3.9–10.3)

## 2014-11-02 LAB — MAGNESIUM (CC13): Magnesium: 2.2 mg/dl (ref 1.5–2.5)

## 2014-11-02 MED ORDER — SODIUM CHLORIDE 0.9 % IV SOLN
Freq: Once | INTRAVENOUS | Status: AC
  Administered 2014-11-02: 10:00:00 via INTRAVENOUS

## 2014-11-02 MED ORDER — DEXAMETHASONE SODIUM PHOSPHATE 20 MG/5ML IJ SOLN
INTRAMUSCULAR | Status: AC
  Filled 2014-11-02: qty 5

## 2014-11-02 MED ORDER — PALONOSETRON HCL INJECTION 0.25 MG/5ML
0.2500 mg | Freq: Once | INTRAVENOUS | Status: AC
  Administered 2014-11-02: 0.25 mg via INTRAVENOUS

## 2014-11-02 MED ORDER — HEPARIN SOD (PORK) LOCK FLUSH 100 UNIT/ML IV SOLN
500.0000 [IU] | Freq: Once | INTRAVENOUS | Status: AC | PRN
  Administered 2014-11-02: 500 [IU]
  Filled 2014-11-02: qty 5

## 2014-11-02 MED ORDER — PALONOSETRON HCL INJECTION 0.25 MG/5ML
INTRAVENOUS | Status: AC
  Filled 2014-11-02: qty 5

## 2014-11-02 MED ORDER — POTASSIUM CHLORIDE 2 MEQ/ML IV SOLN
Freq: Once | INTRAVENOUS | Status: AC
  Administered 2014-11-02: 10:00:00 via INTRAVENOUS
  Filled 2014-11-02: qty 10

## 2014-11-02 MED ORDER — SODIUM CHLORIDE 0.9 % IV SOLN
25.0000 mg/m2 | Freq: Once | INTRAVENOUS | Status: AC
  Administered 2014-11-02: 43 mg via INTRAVENOUS
  Filled 2014-11-02: qty 43

## 2014-11-02 MED ORDER — SODIUM CHLORIDE 0.9 % IV SOLN
150.0000 mg | Freq: Once | INTRAVENOUS | Status: AC
  Administered 2014-11-02: 150 mg via INTRAVENOUS
  Filled 2014-11-02: qty 5

## 2014-11-02 MED ORDER — SODIUM CHLORIDE 0.9 % IJ SOLN
10.0000 mL | INTRAMUSCULAR | Status: DC | PRN
  Administered 2014-11-02: 10 mL
  Filled 2014-11-02: qty 10

## 2014-11-02 MED ORDER — DEXAMETHASONE SODIUM PHOSPHATE 20 MG/5ML IJ SOLN
12.0000 mg | Freq: Once | INTRAMUSCULAR | Status: AC
  Administered 2014-11-02: 12 mg via INTRAVENOUS

## 2014-11-02 MED ORDER — SODIUM CHLORIDE 0.9 % IV SOLN
1000.0000 mg/m2 | Freq: Once | INTRAVENOUS | Status: AC
  Administered 2014-11-02: 1710 mg via INTRAVENOUS
  Filled 2014-11-02: qty 44.97

## 2014-11-02 NOTE — Progress Notes (Signed)
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 Hepatosplenomegaly. Abdominal and pelvic ascites and Pathologically enlarged upper abdominal. CT chest (-).    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 Bipolar 2. Hypothyroidism   CURRENT THERAPY: Cisplatin 25 mg/m, gemcitabine 1000 mg/m, on day 1 and 8, every 21 days started on 10/10/2014  INTERVAL HISTORY: [name] for follow-up. She walked in with a cane today, instead of a wheelchair. She is accompanied by her husband. She tolerated the first cycle chemotherapy well, without significant nausea or other side effects. She overall feels better, only use oxycodone 2-3 times a day, no complaints of Pain, appetite is much better, she gained some weight back. Her leg edema and near resolved.  REVIEW OF SYSTEMS:   Constitutional: Denies fevers, chills, (+)  fatigued  Eyes: Denies blurriness of vision Ears, nose, mouth, throat, and face: Denies mucositis or sore throat Respiratory: Denies cough, dyspnea or wheezes Cardiovascular: Denies palpitation, chest discomfort or lower extremity swelling Gastrointestinal:  no nausea, no heartburn or change in bowel habits Skin: Denies abnormal skin rashes. (+) b/l leg swelling  Lymphatics: Denies new lymphadenopathy or easy  bruising Neurological:Denies numbness, tingling or new weaknesses Behavioral/Psych: Mood is stable, no new changes  All other systems were reviewed with the patient and are negative.  I have reviewed the past medical history, past surgical history, social history and family history with the patient and they are unchanged from previous note.  ALLERGIES:  is allergic to buspar; cymbalta; hydroxyzine; and sertraline.  MEDICATIONS:  Current Outpatient Prescriptions  Medication Sig Dispense Refill  . benztropine (COGENTIN) 1 MG tablet Take 1 tablet (1 mg total) by mouth at bedtime. 90 tablet 2  . buPROPion (WELLBUTRIN XL) 300 MG 24 hr tablet Take 1 tablet (300 mg total) by mouth daily. 90 tablet 2  . dexamethasone (DECADRON) 4 MG tablet Take 1 tablet (4 mg total) by mouth daily. 20 tablet 2  . lactulose (CHRONULAC) 10 GM/15ML solution 15 ml q 4 hr as needed for constipation.  Max of 60 ml per day (Patient taking differently: Take 10 g by mouth daily as needed. 30 ml q 4 hr as needed for constipation.  Max of 60 ml per day) 500 mL 2  . levothyroxine (SYNTHROID, LEVOTHROID) 88 MCG tablet Take 88 mcg by mouth at bedtime.     . lidocaine-prilocaine (EMLA) cream Apply 1 application topically as needed. Apply to port 1 hr before treatment as instructed. 30 g 0  . morphine (MSIR) 15 MG tablet Take 15 mg by mouth every 12 (twelve) hours as needed for moderate pain or severe pain.    Marland Kitchen ondansetron (ZOFRAN) 4 MG tablet Take 1 tablet (4 mg total) by mouth every 6 (six) hours as needed for nausea or vomiting. 60 tablet 4  . oxyCODONE (OXY IR/ROXICODONE) 5 MG immediate release tablet Take 1 tablet (5 mg total)  by mouth every 4 (four) hours as needed for severe pain. 120 tablet 0  . prochlorperazine (COMPAZINE) 10 MG tablet Take 1 tablet (10 mg total) by mouth every 6 (six) hours as needed for nausea or vomiting. 30 tablet 2  . propranolol (INDERAL) 10 MG tablet Take 1 tablet (10 mg total) by mouth 3 (three)  times daily. 270 tablet 2  . risperiDONE (RISPERDAL) 3 MG tablet Take 1 tablet (3 mg total) by mouth at bedtime. 90 tablet 2  . zolpidem (AMBIEN) 10 MG tablet Take 1 tablet (10 mg total) by mouth at bedtime as needed for sleep. 90 tablet 1   No current facility-administered medications for this visit.   Facility-Administered Medications Ordered in Other Visits  Medication Dose Route Frequency Provider Last Rate Last Dose  . Tbo-Filgrastim (GRANIX) injection 300 mcg  300 mcg Subcutaneous Once [name], MD        PHYSICAL EXAMINATION: ECOG PERFORMANCE STATUS: 2   Filed Vitals:   11/02/14 0906  BP: 171/90  Pulse: 70  Resp: 18   Filed Weights   11/02/14 0906  Weight: 114 lb 12.8 oz (52.073 kg)    GENERAL:alert, no distress and comfortable SKIN: skin color, texture, turgor are normal, no rashes or significant lesions EYES: normal, Conjunctiva are pink and non-injected, sclera clear OROPHARYNX:no exudate, no erythema and lips, buccal mucosa, and tongue normal  NECK: supple, thyroid normal size, non-tender, without nodularity LYMPH:  no palpable lymphadenopathy in the cervical, axillary or inguinal LUNGS: clear to auscultation and percussion with normal breathing effort HEART: regular rate & rhythm and no murmurs and no lower extremity edema ABDOMEN:abdomen soft,  and normal bowel sounds. Non-distended, (+) hepatomegaly has improved, about 6 cm below rib cage now  Musculoskeletal:no cyanosis of digits and no clubbing  NEURO: alert & oriented x 3 with fluent speech, no focal motor/sensory deficits  LABORATORY DATA:  I have reviewed the data as listed  CBC Latest Ref Rng 11/02/2014 10/16/2014 10/11/2014  WBC 3.9 - 10.3 10e3/uL 6.1 4.1 9.4  Hemoglobin 11.6 - 15.9 g/dL 11.9 8.3(L) 9.4(L)  Hematocrit 34.8 - 46.6 % 38.7 26.6(L) 31.1(L)  Platelets 145 - 400 10e3/uL 490(H) 211 278    CMP Latest Ref Rng 11/02/2014 10/16/2014 10/10/2014  Glucose 70 - 140 mg/dl 84 90 96  BUN 7.0 - 26.0 mg/dL  13.7 9.3 7.7  Creatinine 0.6 - 1.1 mg/dL 0.7 0.5(L) 0.5(L)  Sodium 136 - 145 mEq/L 140 140 140  Potassium 3.5 - 5.1 mEq/L 4.1 4.0 3.7  Chloride 96 - 112 mEq/L - - -  CO2 22 - 29 mEq/L 28 25 27   Calcium 8.4 - 10.4 mg/dL 9.6 8.7 8.8  Total Protein 6.4 - 8.3 g/dL 7.1 6.1(L) 6.1(L)  Total Bilirubin 0.20 - 1.20 mg/dL 0.46 0.31 0.58  Alkaline Phos 40 - 150 U/L 135 159(H) 169(H)  AST 5 - 34 U/L 19 47(H) 14  ALT 0 - 55 U/L 19 51 12      Surgical path 08/03/14 - ADENOCARCINOMA. Microscopic Comment The core biopsies are extensively involved by adenocarcinoma with focal goblet cells and foci with signet ring morphology. Immunohistochemistry will be performed and reported as an addendum. (JDP:gt, 08/04/14) Addendum: Immunohistochemistry is performed and the tumor is positive with Cytokeratin 7 and negative with Cytokeratin 20, CDX2, thyroid transcription factor-1, Napsin A, estrogen receptor, progesterone receptor and gross cystic disease fluid protein. The immunophenotype is nonspecific.  RADIOGRAPHIC STUDIES: I have personally reviewed the radiological images as listed and agreed with  the findings in the report.  CT chest 09/27/2014 1. Mild left supraclavicular adenopathy, for which nodal metastasis cannot be excluded. This is grossly similar. 2. Otherwise, no evidence of metastatic disease within the chest. 3. Right greater than left pleural effusions are similar. Similar right base atelectasis. 4. Borderline cardiomegaly with increase in mild to moderate pericardial effusion  CT abdomen and pelvis 09/27/2014 1. Progressive hepatic metastasis with persistent marked hepatomegaly. 2. Similar nodal metastasis in the portal caval space. 3. Increase in large volume abdominal pelvic ascites. 4. Diffuse anasarca.  I have reviewed her EGD and colonoscopy reports which was done on 11/20, which was negative for ulceration or mass.   ASSESSMENT & PLAN:  54 yo female with PMH of bipolar but  otherwise healthy, no history of liver disease or alcohol abuse, no history of blood transfusion, who presented with significant N/V, abdominal pain and bloating, New discovered multiple liver masses and small ascites, and elevated serum beta HCG, CA125 and CEA.   1. 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 -The patient and her family understand the overall poor prognosis, and her treatment goal is palliative - Her case was reviewed in our GI tumor Board. The radiologist feels her liver lesions are most consistent with metastatic lesions, not typical for cholangiocarcinoma, but pathology morphology favors cholangiocarcinoma or pancreatic cancer. CA19.9 57,711. 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  -her recent restaging CT showed disease progression in her liver and worsening ascites. She is on second line cisplatin and gemcitabine now. Tolerated well. Clinically she is doing better, less pain and ascites. -We will repeat a restaging CT scan after this cycle.  2. Anemia, probably related to her underline malignancy and anemia -Her anemia improved after blood transfusion -Continue monitoring  3. Bilateral leg edema -I checked Doppler ultrasound on her legs which showed no evidence of DVT. -this is likely related to her hypoalbuminemia, and the poor venous return. I encouraged her to elevate her legs and the use compression stocks. -This is nearly resolved now  4. malignant ascites -She had paracentesis once --No significant ascites on exam today   5. Malnutrition and deconditioning secondary to her probably underline malignancy  -I encouraged her to take nutrition supplement, and continue home PT.  -Improved  6. Bipolar -stable mood, continue meds  7. Abdominal pain -Stable. She still has very high co-pay for OxyContin and she could not afford it. Her pain seemed  very well controlled by oxycodone, I instructed her to take only as needed  Plan -Cycle 2 day 1 chemotherapy today. I'll arrange C2D8 chemotherapy at [facility] if possible. -Restaging CT chest, abdomen and pelvis before cycle 3. -Return to clinic in 3 weeks for cycle 3.   All questions were answered. The patient knows to call the clinic with any problems, questions or concerns. No barriers to learning was detected.  I spent 20 minutes counseling the patient face to face. The total time spent in the appointment was 25 minutes and more than 50% was on counseling and review of test results     [name], MD 11/03/2014

## 2014-11-02 NOTE — Telephone Encounter (Signed)
added appts....Dr. Burr Medico to call [facility] to see if they will do chemo only at their facility.Marland KitchenMarland KitchenMarland KitchenMarland Kitchenpt comming back after chemo

## 2014-11-02 NOTE — Telephone Encounter (Signed)
Lorain Childes desk nurse [facility] would not allow chemo only at their facility....Dr. Burr Medico to call and s.w. [facility] Dr. to approve.Marland KitchenMarland Kitchenpt will be contacted

## 2014-11-02 NOTE — Patient Instructions (Signed)
[facility] Discharge Instructions for Patients Receiving Chemotherapy  Today you received the following chemotherapy agents: Gemzar and Cisplatin.  To help prevent nausea and vomiting after your treatment, we encourage you to take your nausea medication as prescribed.   If you develop nausea and vomiting that is not controlled by your nausea medication, call the clinic.   BELOW ARE SYMPTOMS THAT SHOULD BE REPORTED IMMEDIATELY:  *FEVER GREATER THAN 100.5 F  *CHILLS WITH OR WITHOUT FEVER  NAUSEA AND VOMITING THAT IS NOT CONTROLLED WITH YOUR NAUSEA MEDICATION  *UNUSUAL SHORTNESS OF BREATH  *UNUSUAL BRUISING OR BLEEDING  TENDERNESS IN MOUTH AND THROAT WITH OR WITHOUT PRESENCE OF ULCERS  *URINARY PROBLEMS  *BOWEL PROBLEMS  UNUSUAL RASH Items with * indicate a potential emergency and should be followed up as soon as possible.  Feel free to call the clinic you have any questions or concerns. The clinic phone number is [phone].    

## 2014-11-02 NOTE — Telephone Encounter (Signed)
Lorain Childes desk nurse [facility] would not allow chemo only at their facility....Dr. Burr Medico to call and s.w. [facility] Dr. to approve

## 2014-11-03 ENCOUNTER — Encounter: Payer: Self-pay | Admitting: Hematology

## 2014-11-06 ENCOUNTER — Telehealth: Payer: Self-pay | Admitting: *Deleted

## 2014-11-06 ENCOUNTER — Other Ambulatory Visit: Payer: Self-pay | Admitting: *Deleted

## 2014-11-06 ENCOUNTER — Telehealth: Payer: Self-pay | Admitting: Hematology

## 2014-11-06 NOTE — Telephone Encounter (Signed)
Sorry that [name] will not take her without transferring her care over there.  Please schedule her chemo here for this Friday  Thanks.  Krista Blue

## 2014-11-06 NOTE — Telephone Encounter (Signed)
IF UNABLE TO HAVE TREATMENT DONE AT [facility] WOULD LIKE TREATMENT DONE ON Friday IN [place].

## 2014-11-06 NOTE — Telephone Encounter (Signed)
Confirm appointment for 02/26

## 2014-11-07 ENCOUNTER — Other Ambulatory Visit: Payer: Self-pay

## 2014-11-07 ENCOUNTER — Ambulatory Visit: Payer: Self-pay

## 2014-11-07 ENCOUNTER — Ambulatory Visit: Payer: Self-pay | Admitting: Hematology

## 2014-11-10 ENCOUNTER — Ambulatory Visit (HOSPITAL_BASED_OUTPATIENT_CLINIC_OR_DEPARTMENT_OTHER): Payer: Commercial Managed Care - HMO

## 2014-11-10 ENCOUNTER — Other Ambulatory Visit (HOSPITAL_BASED_OUTPATIENT_CLINIC_OR_DEPARTMENT_OTHER): Payer: Commercial Managed Care - HMO

## 2014-11-10 DIAGNOSIS — C787 Secondary malignant neoplasm of liver and intrahepatic bile duct: Secondary | ICD-10-CM

## 2014-11-10 DIAGNOSIS — C801 Malignant (primary) neoplasm, unspecified: Secondary | ICD-10-CM

## 2014-11-10 DIAGNOSIS — Z5111 Encounter for antineoplastic chemotherapy: Secondary | ICD-10-CM

## 2014-11-10 LAB — COMPREHENSIVE METABOLIC PANEL (CC13)
ALT: 27 U/L (ref 0–55)
AST: 23 U/L (ref 5–34)
Albumin: 3.7 g/dL (ref 3.5–5.0)
Alkaline Phosphatase: 119 U/L (ref 40–150)
Anion Gap: 12 mEq/L — ABNORMAL HIGH (ref 3–11)
BUN: 12.7 mg/dL (ref 7.0–26.0)
CALCIUM: 9.8 mg/dL (ref 8.4–10.4)
CHLORIDE: 104 meq/L (ref 98–109)
CO2: 27 mEq/L (ref 22–29)
CREATININE: 0.7 mg/dL (ref 0.6–1.1)
EGFR: >90 mL/min/{1.73_m2} (ref >90)
Glucose: 80 mg/dl (ref 70–140)
Potassium: 4.1 mEq/L (ref 3.5–5.1)
Sodium: 142 mEq/L (ref 136–145)
Total Bilirubin: 0.37 mg/dL (ref 0.20–1.20)
Total Protein: 6.8 g/dL (ref 6.4–8.3)

## 2014-11-10 LAB — CBC WITH DIFFERENTIAL/PLATELET
BASO%: 0.5 % (ref 0.0–2.0)
Basophils Absolute: 0 10*3/uL (ref 0.0–0.1)
EOS%: 0.3 % (ref 0.0–7.0)
Eosinophils Absolute: 0 10*3/uL (ref 0.0–0.5)
HEMATOCRIT: 34.7 % — AB (ref 34.8–46.6)
HEMOGLOBIN: 11.2 g/dL — AB (ref 11.6–15.9)
LYMPH%: 25.6 % (ref 14.0–49.7)
MCH: 30.8 pg (ref 25.1–34.0)
MCHC: 32.2 g/dL (ref 31.5–36.0)
MCV: 95.7 fL (ref 79.5–101.0)
MONO#: 1 10*3/uL — AB (ref 0.1–0.9)
MONO%: 9.9 % (ref 0.0–14.0)
NEUT#: 6.3 10*3/uL (ref 1.5–6.5)
NEUT%: 63.7 % (ref 38.4–76.8)
PLATELETS: 223 10*3/uL (ref 145–400)
RBC: 3.62 10*6/uL — AB (ref 3.70–5.45)
RDW: 17.4 % — ABNORMAL HIGH (ref 11.2–14.5)
WBC: 9.8 10*3/uL (ref 3.9–10.3)
lymph#: 2.5 10*3/uL (ref 0.9–3.3)

## 2014-11-10 MED ORDER — SODIUM CHLORIDE 0.9 % IV SOLN
25.0000 mg/m2 | Freq: Once | INTRAVENOUS | Status: AC
  Administered 2014-11-10: 43 mg via INTRAVENOUS
  Filled 2014-11-10: qty 43

## 2014-11-10 MED ORDER — PALONOSETRON HCL INJECTION 0.25 MG/5ML
INTRAVENOUS | Status: AC
  Filled 2014-11-10: qty 5

## 2014-11-10 MED ORDER — HEPARIN SOD (PORK) LOCK FLUSH 100 UNIT/ML IV SOLN
500.0000 [IU] | Freq: Once | INTRAVENOUS | Status: AC | PRN
  Administered 2014-11-10: 500 [IU]
  Filled 2014-11-10: qty 5

## 2014-11-10 MED ORDER — SODIUM CHLORIDE 0.9 % IJ SOLN
10.0000 mL | INTRAMUSCULAR | Status: DC | PRN
  Administered 2014-11-10: 10 mL
  Filled 2014-11-10: qty 10

## 2014-11-10 MED ORDER — DEXAMETHASONE SODIUM PHOSPHATE 20 MG/5ML IJ SOLN
INTRAMUSCULAR | Status: AC
  Filled 2014-11-10: qty 5

## 2014-11-10 MED ORDER — POTASSIUM CHLORIDE 2 MEQ/ML IV SOLN
Freq: Once | INTRAVENOUS | Status: AC
  Administered 2014-11-10: 10:00:00 via INTRAVENOUS
  Filled 2014-11-10: qty 10

## 2014-11-10 MED ORDER — PALONOSETRON HCL INJECTION 0.25 MG/5ML
0.2500 mg | Freq: Once | INTRAVENOUS | Status: AC
  Administered 2014-11-10: 0.25 mg via INTRAVENOUS

## 2014-11-10 MED ORDER — SODIUM CHLORIDE 0.9 % IV SOLN
Freq: Once | INTRAVENOUS | Status: AC
  Administered 2014-11-10: 10:00:00 via INTRAVENOUS

## 2014-11-10 MED ORDER — SODIUM CHLORIDE 0.9 % IV SOLN
1000.0000 mg/m2 | Freq: Once | INTRAVENOUS | Status: AC
  Administered 2014-11-10: 1710 mg via INTRAVENOUS
  Filled 2014-11-10: qty 44.97

## 2014-11-10 MED ORDER — SODIUM CHLORIDE 0.9 % IV SOLN
150.0000 mg | Freq: Once | INTRAVENOUS | Status: AC
  Administered 2014-11-10: 150 mg via INTRAVENOUS
  Filled 2014-11-10: qty 5

## 2014-11-10 MED ORDER — DEXAMETHASONE SODIUM PHOSPHATE 20 MG/5ML IJ SOLN
12.0000 mg | Freq: Once | INTRAMUSCULAR | Status: AC
  Administered 2014-11-10: 12 mg via INTRAVENOUS

## 2014-11-10 NOTE — Patient Instructions (Signed)
[facility] Discharge Instructions for Patients Receiving Chemotherapy  Today you received the following chemotherapy agents: Gemzar and Cisplatin.  To help prevent nausea and vomiting after your treatment, we encourage you to take your nausea medication as prescribed.   If you develop nausea and vomiting that is not controlled by your nausea medication, call the clinic.   BELOW ARE SYMPTOMS THAT SHOULD BE REPORTED IMMEDIATELY:  *FEVER GREATER THAN 100.5 F  *CHILLS WITH OR WITHOUT FEVER  NAUSEA AND VOMITING THAT IS NOT CONTROLLED WITH YOUR NAUSEA MEDICATION  *UNUSUAL SHORTNESS OF BREATH  *UNUSUAL BRUISING OR BLEEDING  TENDERNESS IN MOUTH AND THROAT WITH OR WITHOUT PRESENCE OF ULCERS  *URINARY PROBLEMS  *BOWEL PROBLEMS  UNUSUAL RASH Items with * indicate a potential emergency and should be followed up as soon as possible.  Feel free to call the clinic you have any questions or concerns. The clinic phone number is [phone].    

## 2014-11-10 NOTE — Progress Notes (Signed)
Per Montel Clock, Pharm, chemo ok to treat with current dose (greater than 10% differential).

## 2014-11-11 LAB — CA 125: CA 125: 703 U/mL — ABNORMAL HIGH (ref <35)

## 2014-11-11 LAB — CANCER ANTIGEN 19-9: CA 19-9: 25421.3 U/mL — ABNORMAL HIGH (ref <35.0)

## 2014-11-16 ENCOUNTER — Encounter (HOSPITAL_COMMUNITY): Payer: Self-pay

## 2014-11-16 ENCOUNTER — Ambulatory Visit (HOSPITAL_COMMUNITY)
Admission: RE | Admit: 2014-11-16 | Discharge: 2014-11-16 | Disposition: A | Discharge status: Home / Self Care | Payer: Commercial Managed Care - HMO | Source: Ambulatory Visit | Attending: Hematology | Admitting: Hematology

## 2014-11-16 DIAGNOSIS — C787 Secondary malignant neoplasm of liver and intrahepatic bile duct: Secondary | ICD-10-CM | POA: Diagnosis present

## 2014-11-16 DIAGNOSIS — K7460 Unspecified cirrhosis of liver: Secondary | ICD-10-CM | POA: Insufficient documentation

## 2014-11-16 DIAGNOSIS — C801 Malignant (primary) neoplasm, unspecified: Secondary | ICD-10-CM | POA: Diagnosis not present

## 2014-11-16 DIAGNOSIS — Z79899 Other long term (current) drug therapy: Secondary | ICD-10-CM | POA: Diagnosis not present

## 2014-11-16 MED ORDER — IOHEXOL 300 MG/ML  SOLN
100.0000 mL | Freq: Once | INTRAMUSCULAR | Status: AC | PRN
  Administered 2014-11-16: 100 mL via INTRAVENOUS

## 2014-11-24 ENCOUNTER — Telehealth: Payer: Self-pay | Admitting: Hematology

## 2014-11-24 ENCOUNTER — Ambulatory Visit (HOSPITAL_BASED_OUTPATIENT_CLINIC_OR_DEPARTMENT_OTHER): Payer: Commercial Managed Care - HMO

## 2014-11-24 ENCOUNTER — Telehealth: Payer: Self-pay | Admitting: *Deleted

## 2014-11-24 ENCOUNTER — Ambulatory Visit (HOSPITAL_BASED_OUTPATIENT_CLINIC_OR_DEPARTMENT_OTHER): Payer: Commercial Managed Care - HMO | Admitting: Hematology

## 2014-11-24 ENCOUNTER — Other Ambulatory Visit (HOSPITAL_BASED_OUTPATIENT_CLINIC_OR_DEPARTMENT_OTHER): Payer: Commercial Managed Care - HMO

## 2014-11-24 VITALS — BP 127/84 | HR 73 | Temp 97.8°F | Resp 18 | Ht 66.0 in | Wt 112.3 lb

## 2014-11-24 DIAGNOSIS — C801 Malignant (primary) neoplasm, unspecified: Secondary | ICD-10-CM

## 2014-11-24 DIAGNOSIS — R609 Edema, unspecified: Secondary | ICD-10-CM

## 2014-11-24 DIAGNOSIS — D649 Anemia, unspecified: Secondary | ICD-10-CM

## 2014-11-24 DIAGNOSIS — E46 Unspecified protein-calorie malnutrition: Secondary | ICD-10-CM

## 2014-11-24 DIAGNOSIS — C787 Secondary malignant neoplasm of liver and intrahepatic bile duct: Secondary | ICD-10-CM

## 2014-11-24 DIAGNOSIS — C799 Secondary malignant neoplasm of unspecified site: Secondary | ICD-10-CM

## 2014-11-24 DIAGNOSIS — Z5111 Encounter for antineoplastic chemotherapy: Secondary | ICD-10-CM

## 2014-11-24 DIAGNOSIS — R160 Hepatomegaly, not elsewhere classified: Secondary | ICD-10-CM

## 2014-11-24 DIAGNOSIS — R109 Unspecified abdominal pain: Secondary | ICD-10-CM

## 2014-11-24 DIAGNOSIS — F319 Bipolar disorder, unspecified: Secondary | ICD-10-CM

## 2014-11-24 DIAGNOSIS — D630 Anemia in neoplastic disease: Secondary | ICD-10-CM

## 2014-11-24 DIAGNOSIS — R180 Malignant ascites: Secondary | ICD-10-CM

## 2014-11-24 LAB — COMPREHENSIVE METABOLIC PANEL (CC13)
ALT: 23 U/L (ref 0–55)
ANION GAP: 9 meq/L (ref 3–11)
AST: 20 U/L (ref 5–34)
Albumin: 3.6 g/dL (ref 3.5–5.0)
Alkaline Phosphatase: 96 U/L (ref 40–150)
BUN: 8.4 mg/dL (ref 7.0–26.0)
CALCIUM: 9.5 mg/dL (ref 8.4–10.4)
CHLORIDE: 108 meq/L (ref 98–109)
CO2: 27 meq/L (ref 22–29)
Creatinine: 0.7 mg/dL (ref 0.6–1.1)
Glucose: 91 mg/dl (ref 70–140)
POTASSIUM: 4 meq/L (ref 3.5–5.1)
Sodium: 143 mEq/L (ref 136–145)
Total Bilirubin: 0.51 mg/dL (ref 0.20–1.20)
Total Protein: 6.7 g/dL (ref 6.4–8.3)

## 2014-11-24 LAB — CBC WITH DIFFERENTIAL/PLATELET
BASO%: 0.5 % (ref 0.0–2.0)
BASOS ABS: 0 10*3/uL (ref 0.0–0.1)
EOS ABS: 0 10*3/uL (ref 0.0–0.5)
EOS%: 0.3 % (ref 0.0–7.0)
HCT: 35.4 % (ref 34.8–46.6)
HGB: 11.3 g/dL — ABNORMAL LOW (ref 11.6–15.9)
LYMPH#: 1.6 10*3/uL (ref 0.9–3.3)
LYMPH%: 18.8 % (ref 14.0–49.7)
MCH: 30.8 pg (ref 25.1–34.0)
MCHC: 31.9 g/dL (ref 31.5–36.0)
MCV: 96.7 fL (ref 79.5–101.0)
MONO#: 0.8 10*3/uL (ref 0.1–0.9)
MONO%: 9.9 % (ref 0.0–14.0)
NEUT%: 70.5 % (ref 38.4–76.8)
NEUTROS ABS: 6 10*3/uL (ref 1.5–6.5)
Platelets: 250 10*3/uL (ref 145–400)
RBC: 3.66 10*6/uL — AB (ref 3.70–5.45)
RDW: 18 % — AB (ref 11.2–14.5)
WBC: 8.6 10*3/uL (ref 3.9–10.3)

## 2014-11-24 LAB — MAGNESIUM (CC13): Magnesium: 2 mg/dl (ref 1.5–2.5)

## 2014-11-24 MED ORDER — POTASSIUM CHLORIDE 2 MEQ/ML IV SOLN
Freq: Once | INTRAVENOUS | Status: AC
  Administered 2014-11-24: 10:00:00 via INTRAVENOUS
  Filled 2014-11-24: qty 10

## 2014-11-24 MED ORDER — SODIUM CHLORIDE 0.9 % IV SOLN
1000.0000 mg/m2 | Freq: Once | INTRAVENOUS | Status: AC
  Administered 2014-11-24: 1558 mg via INTRAVENOUS
  Filled 2014-11-24: qty 40.98

## 2014-11-24 MED ORDER — CISPLATIN CHEMO INJECTION 100MG/100ML: 25.0000 mg/m2 | Freq: Once | INTRAVENOUS | Status: DC

## 2014-11-24 MED ORDER — SODIUM CHLORIDE 0.9 % IV SOLN
Freq: Once | INTRAVENOUS | Status: AC
  Administered 2014-11-24: 10:00:00 via INTRAVENOUS

## 2014-11-24 MED ORDER — SODIUM CHLORIDE 0.9 % IJ SOLN
10.0000 mL | INTRAMUSCULAR | Status: DC | PRN
  Administered 2014-11-24: 10 mL
  Filled 2014-11-24: qty 10

## 2014-11-24 MED ORDER — SODIUM CHLORIDE 0.9 % IV SOLN
Freq: Once | INTRAVENOUS | Status: AC
  Administered 2014-11-24: 11:00:00 via INTRAVENOUS
  Filled 2014-11-24: qty 5

## 2014-11-24 MED ORDER — OXYCODONE-ACETAMINOPHEN 5-325 MG PO TABS
1.0000 | ORAL_TABLET | Freq: Once | ORAL | Status: AC
  Administered 2014-11-24: 1 via ORAL

## 2014-11-24 MED ORDER — PALONOSETRON HCL INJECTION 0.25 MG/5ML
0.2500 mg | Freq: Once | INTRAVENOUS | Status: AC
  Administered 2014-11-24: 0.25 mg via INTRAVENOUS

## 2014-11-24 MED ORDER — OXYCODONE HCL 5 MG PO TABS: 5.0000 mg | ORAL_TABLET | ORAL | Status: AC | PRN

## 2014-11-24 MED ORDER — SODIUM CHLORIDE 0.9 % IV SOLN
25.0000 mg/m2 | Freq: Once | INTRAVENOUS | Status: AC
  Administered 2014-11-24: 39 mg via INTRAVENOUS
  Filled 2014-11-24: qty 39

## 2014-11-24 MED ORDER — SODIUM CHLORIDE 0.9 % IV SOLN: 1000.0000 mg/m2 | Freq: Once | INTRAVENOUS | Status: DC

## 2014-11-24 MED ORDER — OXYCODONE-ACETAMINOPHEN 5-325 MG PO TABS
ORAL_TABLET | ORAL | Status: AC
  Filled 2014-11-24: qty 1

## 2014-11-24 MED ORDER — PALONOSETRON HCL INJECTION 0.25 MG/5ML
INTRAVENOUS | Status: AC
  Filled 2014-11-24: qty 5

## 2014-11-24 MED ORDER — HEPARIN SOD (PORK) LOCK FLUSH 100 UNIT/ML IV SOLN
500.0000 [IU] | Freq: Once | INTRAVENOUS | Status: AC | PRN
  Administered 2014-11-24: 500 [IU]
  Filled 2014-11-24: qty 5

## 2014-11-24 NOTE — Patient Instructions (Signed)
[name] Discharge Instructions for Patients Receiving Chemotherapy  Today you received the following chemotherapy agents:  Gemzar and Cisplatin  To help prevent nausea and vomiting after your treatment, we encourage you to take your nausea medication as ordered per MD.   If you develop nausea and vomiting that is not controlled by your nausea medication, call the clinic.   BELOW ARE SYMPTOMS THAT SHOULD BE REPORTED IMMEDIATELY:  *FEVER GREATER THAN 100.5 F  *CHILLS WITH OR WITHOUT FEVER  NAUSEA AND VOMITING THAT IS NOT CONTROLLED WITH YOUR NAUSEA MEDICATION  *UNUSUAL SHORTNESS OF BREATH  *UNUSUAL BRUISING OR BLEEDING  TENDERNESS IN MOUTH AND THROAT WITH OR WITHOUT PRESENCE OF ULCERS  *URINARY PROBLEMS  *BOWEL PROBLEMS  UNUSUAL RASH Items with * indicate a potential emergency and should be followed up as soon as possible.  Feel free to call the clinic you have any questions or concerns. The clinic phone number is [phone].

## 2014-11-24 NOTE — Telephone Encounter (Signed)
Met with patient during chemotherapy treatment to provide support and assess for needs to promote continuity of care. Explained role of GI Navigator and provided contact information. She is accompanied today by her daughter. Patient feels she is doing fine physically and emotionally. Provided her information about the GI support group and encouraged her to attend.  Merceda Elks, RN, BSN GI Oncology [place]

## 2014-11-24 NOTE — Telephone Encounter (Signed)
Per staff message and POF I have scheduled appts. Advised scheduler of appts. JMW  

## 2014-11-24 NOTE — Telephone Encounter (Signed)
Gave avs & calendar for March/April. Sent message to schedule treatment. °

## 2014-11-24 NOTE — Progress Notes (Signed)
1445-Pt complaining of sharp pain to abdomen 5/10.  She states that this is not a new pain, and that she takes Oxycodone 5mg  PO at home for pain.  Dr. Burr Medico notified and order received to give pt Percocet 5/325 PO now.  1505-Pt states that pain to abdomen is 0 at this time.

## 2014-11-25 LAB — CA 125: CA 125: 428 U/mL — ABNORMAL HIGH (ref <35)

## 2014-11-25 LAB — CANCER ANTIGEN 19-9: CA 19-9: 16993.2 U/mL — ABNORMAL HIGH (ref <35.0)

## 2014-11-26 ENCOUNTER — Encounter: Payer: Self-pay | Admitting: Hematology

## 2014-11-26 NOTE — Progress Notes (Signed)
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 Hepatosplenomegaly. Abdominal and pelvic ascites and Pathologically enlarged upper abdominal. CT chest (-).    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 Bipolar 2. Hypothyroidism   CURRENT THERAPY: Cisplatin 25 mg/m, gemcitabine 1000 mg/m, on day 1 and 8, every 21 days started on 10/10/2014  INTERVAL HISTORY: [name] returns for follow-up. She is doing well overall. She has good appetite and eats better than before. Her leg swelling has resolved. She takes oxycodone 3-4 times a day, not taking morphine ER, and her pain is well controlled. She is more active, able to do some light house work now. No fever, chill, bleeding or other complains.   REVIEW OF SYSTEMS:   Constitutional: Denies fevers, chills, (+)  fatigued  Eyes: Denies blurriness of vision Ears, nose, mouth, throat, and face: Denies mucositis or sore throat Respiratory: Denies cough, dyspnea or wheezes Cardiovascular: Denies palpitation, chest discomfort or lower extremity swelling Gastrointestinal:  no nausea, no heartburn or change in bowel habits Skin: Denies abnormal skin rashes.  Lymphatics: Denies new lymphadenopathy or easy bruising Neurological:Denies numbness, tingling or new  weaknesses Behavioral/Psych: Mood is stable, no new changes  All other systems were reviewed with the patient and are negative.  I have reviewed the past medical history, past surgical history, social history and family history with the patient and they are unchanged from previous note.  ALLERGIES:  is allergic to buspar; cymbalta; hydroxyzine; and sertraline.  MEDICATIONS:  Current Outpatient Prescriptions  Medication Sig Dispense Refill  . benztropine (COGENTIN) 1 MG tablet Take 1 tablet (1 mg total) by mouth at bedtime. 90 tablet 2  . buPROPion (WELLBUTRIN XL) 300 MG 24 hr tablet Take 1 tablet (300 mg total) by mouth daily. 90 tablet 2  . dexamethasone (DECADRON) 4 MG tablet Take 1 tablet (4 mg total) by mouth daily. 20 tablet 2  . lactulose (CHRONULAC) 10 GM/15ML solution 15 ml q 4 hr as needed for constipation.  Max of 60 ml per day (Patient taking differently: Take 10 g by mouth daily as needed. 30 ml q 4 hr as needed for constipation.  Max of 60 ml per day) 500 mL 2  . levothyroxine (SYNTHROID, LEVOTHROID) 88 MCG tablet Take 88 mcg by mouth at bedtime.     . lidocaine-prilocaine (EMLA) cream Apply 1 application topically as needed. Apply to port 1 hr before treatment as instructed. 30 g 0  . morphine (MSIR) 15 MG tablet Take 15 mg by mouth every 12 (twelve) hours as needed for moderate pain or severe pain.    Marland Kitchen ondansetron (ZOFRAN) 4 MG tablet Take 1 tablet (4 mg total) by mouth every 6 (six) hours as needed for nausea or vomiting. 60 tablet 4  . oxyCODONE (OXY IR/ROXICODONE) 5 MG immediate release tablet Take 1 tablet (5 mg total) by mouth every 4 (four) hours as needed for  severe pain. 120 tablet 0  . prochlorperazine (COMPAZINE) 10 MG tablet Take 1 tablet (10 mg total) by mouth every 6 (six) hours as needed for nausea or vomiting. 30 tablet 2  . propranolol (INDERAL) 10 MG tablet Take 1 tablet (10 mg total) by mouth 3 (three) times daily. 270 tablet 2  . risperiDONE (RISPERDAL) 3 MG  tablet Take 1 tablet (3 mg total) by mouth at bedtime. 90 tablet 2  . zolpidem (AMBIEN) 10 MG tablet Take 1 tablet (10 mg total) by mouth at bedtime as needed for sleep. 90 tablet 1   No current facility-administered medications for this visit.   Facility-Administered Medications Ordered in Other Visits  Medication Dose Route Frequency Provider Last Rate Last Dose  . Tbo-Filgrastim (GRANIX) injection 300 mcg  300 mcg Subcutaneous Once [name], MD        PHYSICAL EXAMINATION: ECOG PERFORMANCE STATUS: 2   Filed Vitals:   11/24/14 0900  BP: 127/84  Pulse: 73  Temp: 97.8 F (36.6 C)  Resp: 18   Filed Weights   11/24/14 0900  Weight: 112 lb 4.8 oz (50.939 kg)    GENERAL:alert, no distress and comfortable SKIN: skin color, texture, turgor are normal, no rashes or significant lesions EYES: normal, Conjunctiva are pink and non-injected, sclera clear OROPHARYNX:no exudate, no erythema and lips, buccal mucosa, and tongue normal  NECK: supple, thyroid normal size, non-tender, without nodularity LYMPH:  no palpable lymphadenopathy in the cervical, axillary or inguinal LUNGS: clear to auscultation and percussion with normal breathing effort HEART: regular rate & rhythm and no murmurs and no lower extremity edema ABDOMEN:abdomen soft,  and normal bowel sounds. Non-distended, (+) hepatomegaly has improved, about 6 cm below rib cage now  Musculoskeletal:no cyanosis of digits and no clubbing  NEURO: alert & oriented x 3 with fluent speech, no focal motor/sensory deficits  LABORATORY DATA:  I have reviewed the data as listed  CBC Latest Ref Rng 11/24/2014 11/10/2014 11/02/2014  WBC 3.9 - 10.3 10e3/uL 8.6 9.8 6.1  Hemoglobin 11.6 - 15.9 g/dL 11.3(L) 11.2(L) 11.9  Hematocrit 34.8 - 46.6 % 35.4 34.7(L) 38.7  Platelets 145 - 400 10e3/uL 250 223 490(H)    CMP Latest Ref Rng 11/24/2014 11/10/2014 11/02/2014  Glucose 70 - 140 mg/dl 91 80 84  BUN 7.0 - 26.0 mg/dL 8.4 12.7 13.7  Creatinine 0.6 -  1.1 mg/dL 0.7 0.7 0.7  Sodium 136 - 145 mEq/L 143 142 140  Potassium 3.5 - 5.1 mEq/L 4.0 4.1 4.1  Chloride 96 - 112 mEq/L - - -  CO2 22 - 29 mEq/L 27 27 28   Calcium 8.4 - 10.4 mg/dL 9.5 9.8 9.6  Total Protein 6.4 - 8.3 g/dL 6.7 6.8 7.1  Total Bilirubin 0.20 - 1.20 mg/dL 0.51 0.37 0.46  Alkaline Phos 40 - 150 U/L 96 119 135  AST 5 - 34 U/L 20 23 19   ALT 0 - 55 U/L 23 27 19    CA 19.9  Status: Finalresult Visible to patient:  Not Released Nextappt: 12/01/2014 at 09:00 AM in Oncology [facility] 1) Dx:  Metastatic adenocarcinoma to liver wi...              Ref Range 2d ago  2wk ago  55mo ago  63mo ago     CA 19-9 <35.0 U/mL 16993.2 (H) 25421.3 (H)CM 65508.4 (H)CM 86243.8 (H)CM           Surgical path 08/03/14 - ADENOCARCINOMA. Microscopic Comment The core biopsies are extensively involved by  adenocarcinoma with focal goblet cells and foci with signet ring morphology. Immunohistochemistry will be performed and reported as an addendum. (JDP:gt, 08/04/14) Addendum: Immunohistochemistry is performed and the tumor is positive with Cytokeratin 7 and negative with Cytokeratin 20, CDX2, thyroid transcription factor-1, Napsin A, estrogen receptor, progesterone receptor and gross cystic disease fluid protein. The immunophenotype is nonspecific.  RADIOGRAPHIC STUDIES: I have personally reviewed the radiological images as listed and agreed with the findings in the report.  CT CHEST IMPRESSION 11/16/2014  1. Decreased size of a low left jugular/supraclavicular node. This suggests response to therapy. 2. Otherwise, no evidence of metastatic disease within the chest. 3. Tiny left apical pulmonary nodule is likely similar and warrants followup attention. 4. Resolved pericardial and right pleural effusions. CT ABDOMEN AND PELVIS IMPRESSION 11/16/2014  1. Response to therapy of hepatic metastasis. 2. Similar nodal metastasis in the porta hepatis. 3. Marked  decrease in abdominal pelvic fluid. 4. Hepatic morphology which may represent mild cirrhosis. Alternatively, this could be "Pseudocirrhosis" secondary to treated metastasis. 5. Possible constipation. 6. Mild bladder wall thickening, new. Correlate with urinary symptoms and possibly urinalysis to exclude urinary tract infection. .  I have reviewed her EGD and colonoscopy reports which was done on 11/20, which was negative for ulceration or mass.   ASSESSMENT & PLAN:  54 yo female with PMH of bipolar but otherwise healthy, no history of liver disease or alcohol abuse, no history of blood transfusion, who presented with significant N/V, abdominal pain and bloating, New discovered multiple liver masses and small ascites, and elevated serum beta HCG, CA125 and CEA.   1. 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 -The patient and her family understand the overall poor prognosis, and her treatment goal is palliative - Her case was reviewed in our GI tumor Board. The radiologist feels her liver lesions are most consistent with metastatic lesions, not typical for cholangiocarcinoma, but pathology morphology favors cholangiocarcinoma or pancreatic cancer. CA19.9 57,711. 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  -I reviewed her restaging CT on 11/16/14 which showed good partial response to treatment. She is clinically doing much better too.  -will continue chemo, plan for a total of 4-6 cycles.   2. Anemia, probably related to her underline malignancy and anemia -Her anemia improved after blood transfusion, stable  -Continue monitoring  3. Bilateral leg edema -I checked Doppler ultrasound on her legs which showed no evidence of DVT. -this is likely related to her hypoalbuminemia and her cancer -This is resolved now, with good tumor response to chemo   4. malignant ascites -She had  paracentesis once --near resolved now, as part of tumor response to chemo    5. Malnutrition and deconditioning secondary to her probably underline malignancy  -I encouraged her to take nutrition supplement, and continue home PT.  -much Improved  6. Bipolar -stable mood, continue meds  7. Abdominal pain -Stable. She still has very high co-pay for OxyContin and she could not afford it. Her pain seemed very well controlled by oxycodone, I instructed her to take only as needed  Plan -Cycle 3 day 1 chemotherapy today.  -Return to clinic in 3 weeks for cycle 4.   All questions were answered. The patient knows to call the clinic with any problems, questions or concerns. No barriers to learning was detected.  I spent 20 minutes counseling the patient face to face. The total time spent in the appointment was 25  minutes and more than 50% was on counseling and review of test results     [name], MD 11/26/2014

## 2014-12-01 ENCOUNTER — Other Ambulatory Visit (HOSPITAL_BASED_OUTPATIENT_CLINIC_OR_DEPARTMENT_OTHER): Payer: Commercial Managed Care - HMO

## 2014-12-01 ENCOUNTER — Ambulatory Visit (HOSPITAL_BASED_OUTPATIENT_CLINIC_OR_DEPARTMENT_OTHER): Payer: Commercial Managed Care - HMO

## 2014-12-01 DIAGNOSIS — C799 Secondary malignant neoplasm of unspecified site: Secondary | ICD-10-CM

## 2014-12-01 DIAGNOSIS — C787 Secondary malignant neoplasm of liver and intrahepatic bile duct: Secondary | ICD-10-CM

## 2014-12-01 DIAGNOSIS — D649 Anemia, unspecified: Secondary | ICD-10-CM

## 2014-12-01 DIAGNOSIS — Z5111 Encounter for antineoplastic chemotherapy: Secondary | ICD-10-CM

## 2014-12-01 DIAGNOSIS — C801 Malignant (primary) neoplasm, unspecified: Secondary | ICD-10-CM

## 2014-12-01 LAB — CBC WITH DIFFERENTIAL/PLATELET
BASO%: 0.5 % (ref 0.0–2.0)
Basophils Absolute: 0 10*3/uL (ref 0.0–0.1)
EOS%: 0.4 % (ref 0.0–7.0)
Eosinophils Absolute: 0 10*3/uL (ref 0.0–0.5)
HEMATOCRIT: 34.3 % — AB (ref 34.8–46.6)
HEMOGLOBIN: 10.9 g/dL — AB (ref 11.6–15.9)
LYMPH#: 2.2 10*3/uL (ref 0.9–3.3)
LYMPH%: 26.3 % (ref 14.0–49.7)
MCH: 31.1 pg (ref 25.1–34.0)
MCHC: 31.8 g/dL (ref 31.5–36.0)
MCV: 97.7 fL (ref 79.5–101.0)
MONO#: 0.7 10*3/uL (ref 0.1–0.9)
MONO%: 8.9 % (ref 0.0–14.0)
NEUT#: 5.2 10*3/uL (ref 1.5–6.5)
NEUT%: 63.9 % (ref 38.4–76.8)
PLATELETS: 189 10*3/uL (ref 145–400)
RBC: 3.51 10*6/uL — ABNORMAL LOW (ref 3.70–5.45)
RDW: 16 % — ABNORMAL HIGH (ref 11.2–14.5)
WBC: 8.2 10*3/uL (ref 3.9–10.3)

## 2014-12-01 LAB — COMPREHENSIVE METABOLIC PANEL (CC13)
ALK PHOS: 90 U/L (ref 40–150)
ALT: 26 U/L (ref 0–55)
AST: 24 U/L (ref 5–34)
Albumin: 3.7 g/dL (ref 3.5–5.0)
Anion Gap: 10 mEq/L (ref 3–11)
BUN: 17.8 mg/dL (ref 7.0–26.0)
CO2: 27 mEq/L (ref 22–29)
CREATININE: 0.7 mg/dL (ref 0.6–1.1)
Calcium: 9.6 mg/dL (ref 8.4–10.4)
Chloride: 105 mEq/L (ref 98–109)
EGFR: >90 mL/min/{1.73_m2} (ref >90)
GLUCOSE: 89 mg/dL (ref 70–140)
Potassium: 4.3 mEq/L (ref 3.5–5.1)
Sodium: 142 mEq/L (ref 136–145)
Total Bilirubin: 0.31 mg/dL (ref 0.20–1.20)
Total Protein: 6.8 g/dL (ref 6.4–8.3)

## 2014-12-01 LAB — MAGNESIUM (CC13): MAGNESIUM: 2 mg/dL (ref 1.5–2.5)

## 2014-12-01 LAB — CANCER ANTIGEN 19-9: CA 19-9: 15297.2 U/mL — ABNORMAL HIGH (ref <35.0)

## 2014-12-01 MED ORDER — SODIUM CHLORIDE 0.9 % IV SOLN
Freq: Once | INTRAVENOUS | Status: AC
  Administered 2014-12-01: 10:00:00 via INTRAVENOUS

## 2014-12-01 MED ORDER — FOSAPREPITANT DIMEGLUMINE INJECTION 150 MG
Freq: Once | INTRAVENOUS | Status: AC
  Administered 2014-12-01: 12:00:00 via INTRAVENOUS
  Filled 2014-12-01: qty 5

## 2014-12-01 MED ORDER — SODIUM CHLORIDE 0.9 % IV SOLN
1000.0000 mg/m2 | Freq: Once | INTRAVENOUS | Status: AC
  Administered 2014-12-01: 1558 mg via INTRAVENOUS
  Filled 2014-12-01: qty 40.98

## 2014-12-01 MED ORDER — SODIUM CHLORIDE 0.9 % IV SOLN
25.0000 mg/m2 | Freq: Once | INTRAVENOUS | Status: AC
  Administered 2014-12-01: 39 mg via INTRAVENOUS
  Filled 2014-12-01: qty 39

## 2014-12-01 MED ORDER — PALONOSETRON HCL INJECTION 0.25 MG/5ML
0.2500 mg | Freq: Once | INTRAVENOUS | Status: AC
  Administered 2014-12-01: 0.25 mg via INTRAVENOUS

## 2014-12-01 MED ORDER — PALONOSETRON HCL INJECTION 0.25 MG/5ML
INTRAVENOUS | Status: AC
  Filled 2014-12-01: qty 5

## 2014-12-01 MED ORDER — POTASSIUM CHLORIDE 2 MEQ/ML IV SOLN
Freq: Once | INTRAVENOUS | Status: AC
  Administered 2014-12-01: 10:00:00 via INTRAVENOUS
  Filled 2014-12-01: qty 10

## 2014-12-01 MED ORDER — SODIUM CHLORIDE 0.9 % IJ SOLN
10.0000 mL | INTRAMUSCULAR | Status: DC | PRN
  Administered 2014-12-01: 10 mL
  Filled 2014-12-01: qty 10

## 2014-12-01 MED ORDER — HEPARIN SOD (PORK) LOCK FLUSH 100 UNIT/ML IV SOLN
500.0000 [IU] | Freq: Once | INTRAVENOUS | Status: AC | PRN
  Administered 2014-12-01: 500 [IU]
  Filled 2014-12-01: qty 5

## 2014-12-01 NOTE — Patient Instructions (Signed)
[facility] Discharge Instructions for Patients Receiving Chemotherapy  Today you received the following chemotherapy agents Cisplatin, Gemzar  To help prevent nausea and vomiting after your treatment, we encourage you to take your nausea medication as directed/prescribed  If you develop nausea and vomiting that is not controlled by your nausea medication, call the clinic.   BELOW ARE SYMPTOMS THAT SHOULD BE REPORTED IMMEDIATELY:  *FEVER GREATER THAN 100.5 F  *CHILLS WITH OR WITHOUT FEVER  NAUSEA AND VOMITING THAT IS NOT CONTROLLED WITH YOUR NAUSEA MEDICATION  *UNUSUAL SHORTNESS OF BREATH  *UNUSUAL BRUISING OR BLEEDING  TENDERNESS IN MOUTH AND THROAT WITH OR WITHOUT PRESENCE OF ULCERS  *URINARY PROBLEMS  *BOWEL PROBLEMS  UNUSUAL RASH Items with * indicate a potential emergency and should be followed up as soon as possible.  Feel free to call the clinic you have any questions or concerns. The clinic phone number is [phone].  Please show the [place] at check-in to the Emergency Department and triage nurse.

## 2014-12-03 ENCOUNTER — Other Ambulatory Visit: Payer: Self-pay | Admitting: Hematology

## 2014-12-09 ENCOUNTER — Other Ambulatory Visit: Payer: Self-pay | Admitting: Hematology

## 2014-12-15 ENCOUNTER — Ambulatory Visit: Payer: Medicare HMO | Admitting: Nutrition

## 2014-12-15 ENCOUNTER — Other Ambulatory Visit (HOSPITAL_BASED_OUTPATIENT_CLINIC_OR_DEPARTMENT_OTHER): Payer: Medicare HMO

## 2014-12-15 ENCOUNTER — Encounter: Payer: Self-pay | Admitting: Physician Assistant

## 2014-12-15 ENCOUNTER — Ambulatory Visit: Payer: Medicare HMO

## 2014-12-15 ENCOUNTER — Ambulatory Visit (HOSPITAL_BASED_OUTPATIENT_CLINIC_OR_DEPARTMENT_OTHER): Payer: Medicare HMO

## 2014-12-15 ENCOUNTER — Ambulatory Visit (HOSPITAL_BASED_OUTPATIENT_CLINIC_OR_DEPARTMENT_OTHER): Payer: Medicare HMO | Admitting: Physician Assistant

## 2014-12-15 VITALS — BP 140/78 | HR 73 | Temp 98.1°F | Resp 18 | Ht 66.0 in | Wt 115.6 lb

## 2014-12-15 DIAGNOSIS — R109 Unspecified abdominal pain: Secondary | ICD-10-CM

## 2014-12-15 DIAGNOSIS — C801 Malignant (primary) neoplasm, unspecified: Secondary | ICD-10-CM

## 2014-12-15 DIAGNOSIS — D649 Anemia, unspecified: Secondary | ICD-10-CM

## 2014-12-15 DIAGNOSIS — E46 Unspecified protein-calorie malnutrition: Secondary | ICD-10-CM

## 2014-12-15 DIAGNOSIS — Z95828 Presence of other vascular implants and grafts: Secondary | ICD-10-CM

## 2014-12-15 DIAGNOSIS — C787 Secondary malignant neoplasm of liver and intrahepatic bile duct: Secondary | ICD-10-CM

## 2014-12-15 DIAGNOSIS — F319 Bipolar disorder, unspecified: Secondary | ICD-10-CM

## 2014-12-15 DIAGNOSIS — Z5111 Encounter for antineoplastic chemotherapy: Secondary | ICD-10-CM

## 2014-12-15 DIAGNOSIS — R609 Edema, unspecified: Secondary | ICD-10-CM

## 2014-12-15 DIAGNOSIS — R180 Malignant ascites: Secondary | ICD-10-CM | POA: Diagnosis not present

## 2014-12-15 LAB — CBC WITH DIFFERENTIAL/PLATELET
BASO%: 0.2 % (ref 0.0–2.0)
Basophils Absolute: 0 10*3/uL (ref 0.0–0.1)
EOS%: 0.7 % (ref 0.0–7.0)
Eosinophils Absolute: 0 10*3/uL (ref 0.0–0.5)
HCT: 31.2 % — ABNORMAL LOW (ref 34.8–46.6)
HGB: 10.3 g/dL — ABNORMAL LOW (ref 11.6–15.9)
LYMPH%: 24.7 % (ref 14.0–49.7)
MCH: 31.8 pg (ref 25.1–34.0)
MCHC: 32.9 g/dL (ref 31.5–36.0)
MCV: 96.8 fL (ref 79.5–101.0)
MONO#: 0.6 10*3/uL (ref 0.1–0.9)
MONO%: 10.7 % (ref 0.0–14.0)
NEUT#: 3.5 10*3/uL (ref 1.5–6.5)
NEUT%: 63.7 % (ref 38.4–76.8)
Platelets: 165 10*3/uL (ref 145–400)
RBC: 3.22 10*6/uL — ABNORMAL LOW (ref 3.70–5.45)
RDW: 19.9 % — AB (ref 11.2–14.5)
WBC: 5.5 10*3/uL (ref 3.9–10.3)
lymph#: 1.4 10*3/uL (ref 0.9–3.3)

## 2014-12-15 LAB — COMPREHENSIVE METABOLIC PANEL (CC13)
ALBUMIN: 3.5 g/dL (ref 3.5–5.0)
ALK PHOS: 90 U/L (ref 40–150)
ALT: 27 U/L (ref 0–55)
AST: 29 U/L (ref 5–34)
Anion Gap: 9 mEq/L (ref 3–11)
BILIRUBIN TOTAL: 0.51 mg/dL (ref 0.20–1.20)
BUN: 10.9 mg/dL (ref 7.0–26.0)
CO2: 25 meq/L (ref 22–29)
Calcium: 9.3 mg/dL (ref 8.4–10.4)
Chloride: 107 mEq/L (ref 98–109)
Creatinine: 0.7 mg/dL (ref 0.6–1.1)
GLUCOSE: 88 mg/dL (ref 70–140)
Potassium: 4.2 mEq/L (ref 3.5–5.1)
Sodium: 141 mEq/L (ref 136–145)
Total Protein: 6.5 g/dL (ref 6.4–8.3)

## 2014-12-15 MED ORDER — PALONOSETRON HCL INJECTION 0.25 MG/5ML
0.2500 mg | Freq: Once | INTRAVENOUS | Status: AC
  Administered 2014-12-15: 0.25 mg via INTRAVENOUS

## 2014-12-15 MED ORDER — SODIUM CHLORIDE 0.9 % IV SOLN
Freq: Once | INTRAVENOUS | Status: AC
  Administered 2014-12-15: 11:00:00 via INTRAVENOUS

## 2014-12-15 MED ORDER — HEPARIN SOD (PORK) LOCK FLUSH 100 UNIT/ML IV SOLN
500.0000 [IU] | Freq: Once | INTRAVENOUS | Status: AC | PRN
  Administered 2014-12-15: 500 [IU]
  Filled 2014-12-15: qty 5

## 2014-12-15 MED ORDER — SODIUM CHLORIDE 0.9 % IJ SOLN
10.0000 mL | INTRAMUSCULAR | Status: DC | PRN
  Administered 2014-12-15: 10 mL
  Filled 2014-12-15: qty 10

## 2014-12-15 MED ORDER — SODIUM CHLORIDE 0.9 % IV SOLN
25.0000 mg/m2 | Freq: Once | INTRAVENOUS | Status: AC
  Administered 2014-12-15: 39 mg via INTRAVENOUS
  Filled 2014-12-15: qty 39

## 2014-12-15 MED ORDER — GEMCITABINE HCL CHEMO INJECTION 1 GM/26.3ML
1000.0000 mg/m2 | Freq: Once | INTRAVENOUS | Status: AC
  Administered 2014-12-15: 1558 mg via INTRAVENOUS
  Filled 2014-12-15: qty 40.98

## 2014-12-15 MED ORDER — SODIUM CHLORIDE 0.9 % IV SOLN
Freq: Once | INTRAVENOUS | Status: AC
  Administered 2014-12-15: 13:00:00 via INTRAVENOUS
  Filled 2014-12-15: qty 5

## 2014-12-15 MED ORDER — POTASSIUM CHLORIDE 2 MEQ/ML IV SOLN
Freq: Once | INTRAVENOUS | Status: AC
  Administered 2014-12-15: 11:00:00 via INTRAVENOUS
  Filled 2014-12-15: qty 10

## 2014-12-15 MED ORDER — SODIUM CHLORIDE 0.9 % IJ SOLN
10.0000 mL | INTRAMUSCULAR | Status: DC | PRN
  Filled 2014-12-15: qty 10

## 2014-12-15 MED ORDER — PALONOSETRON HCL INJECTION 0.25 MG/5ML
INTRAVENOUS | Status: AC
  Filled 2014-12-15: qty 5

## 2014-12-15 NOTE — Progress Notes (Signed)
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 Hepatosplenomegaly. Abdominal and pelvic ascites and Pathologically enlarged upper abdominal. CT chest (-).    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 Bipolar 2. Hypothyroidism   CURRENT THERAPY: Cisplatin 25 mg/m, gemcitabine 1000 mg/m, on day 1 and 8, every 21 days started on 10/10/2014. Status ost 3 cycles.  INTERVAL HISTORY: [name] returns for follow-up. She is doing well overall. She continues to have a good appetite. Her pain continues to be well managed with 3-4 oxycodone tablets a day. She reports some nausea and vomiting but this is well managed with her current antiemetics. She denied any diarrhea, constipation, fever or chills. She voiced no other specific complaints today.   REVIEW OF SYSTEMS:   Constitutional: Denies fevers, chills, (+)  fatigued  Eyes: Denies blurriness of vision Ears, nose, mouth, throat, and face: Denies mucositis or sore throat Respiratory: Denies cough, dyspnea or wheezes Cardiovascular: Denies palpitation, chest discomfort or lower extremity swelling Gastrointestinal:  + nausea and vomiting, no heartburn or change in bowel habits Skin: Denies abnormal skin rashes.  Lymphatics: Denies new lymphadenopathy or easy  bruising Neurological:Denies numbness, tingling or new weaknesses Behavioral/Psych: Mood is stable, no new changes  All other systems were reviewed with the patient and are negative.  I have reviewed the past medical history, past surgical history, social history and family history with the patient and they are unchanged from previous note.  ALLERGIES:  is allergic to buspar; cymbalta; hydroxyzine; and sertraline.  MEDICATIONS:  Current Outpatient Prescriptions  Medication Sig Dispense Refill  . benztropine (COGENTIN) 1 MG tablet Take 1 tablet (1 mg total) by mouth at bedtime. 90 tablet 2  . buPROPion (WELLBUTRIN XL) 300 MG 24 hr tablet Take 1 tablet (300 mg total) by mouth daily. 90 tablet 2  . lactulose (CHRONULAC) 10 GM/15ML solution 15 ml q 4 hr as needed for constipation.  Max of 60 ml per day (Patient taking differently: Take 10 g by mouth daily as needed. 30 ml q 4 hr as needed for constipation.  Max of 60 ml per day) 500 mL 2  . levothyroxine (SYNTHROID, LEVOTHROID) 88 MCG tablet Take 88 mcg by mouth at bedtime.     . lidocaine-prilocaine (EMLA) cream Apply 1 application topically as needed. Apply to port 1 hr before treatment as instructed. 30 g 0  . morphine (MSIR) 15 MG tablet Take 15 mg by mouth every 12 (twelve) hours as needed for moderate pain or severe pain.    Marland Kitchen ondansetron (ZOFRAN) 4 MG tablet Take 1 tablet (4 mg total) by mouth 4 (four) times daily as needed for nausea or vomiting. 60 tablet 0  . oxyCODONE (OXY IR/ROXICODONE) 5 MG immediate release tablet Take 1 tablet (5 mg total) by mouth every 4 (four) hours as needed for severe pain. 120 tablet 0  . prochlorperazine (COMPAZINE) 10 MG tablet Take  1 tablet (10 mg total) by mouth every 6 (six) hours as needed for nausea or vomiting. 30 tablet 2  . propranolol (INDERAL) 10 MG tablet Take 1 tablet (10 mg total) by mouth 3 (three) times daily. 270 tablet 2  . risperiDONE (RISPERDAL) 3 MG tablet Take 1 tablet (3 mg total) by  mouth at bedtime. 90 tablet 2  . zolpidem (AMBIEN) 10 MG tablet Take 1 tablet (10 mg total) by mouth at bedtime as needed for sleep. 90 tablet 1  . dexamethasone (DECADRON) 4 MG tablet Take 1 tablet (4 mg total) by mouth daily. (Patient not taking: Reported on 12/15/2014) 20 tablet 2   No current facility-administered medications for this visit.   Facility-Administered Medications Ordered in Other Visits  Medication Dose Route Frequency Provider Last Rate Last Dose  . CISplatin (PLATINOL) 39 mg in sodium chloride 0.9 % 250 mL chemo infusion  25 mg/m2 (Order-Specific) Intravenous Once [name], MD 289 mL/hr at 12/15/14 1429 39 mg at 12/15/14 1429  . heparin lock flush 100 unit/mL  500 Units Intracatheter Once PRN [name], MD      . sodium chloride 0.9 % injection 10 mL  10 mL Intracatheter PRN [name], MD      . Tbo-Filgrastim [facility]) injection 300 mcg  300 mcg Subcutaneous Once [name], MD        PHYSICAL EXAMINATION: ECOG PERFORMANCE STATUS: 2   Filed Vitals:   12/15/14 0927  BP: 140/78  Pulse: 73  Temp: 98.1 F (36.7 C)  Resp: 18   Filed Weights   12/15/14 0927  Weight: 115 lb 9.6 oz (52.436 kg)    GENERAL:alert, no distress and comfortable SKIN: skin color, texture, turgor are normal, no rashes or significant lesions EYES: normal, Conjunctiva are pink and non-injected, sclera clear OROPHARYNX:no exudate, no erythema and lips, buccal mucosa, and tongue normal  NECK: supple, thyroid normal size, non-tender, without nodularity LYMPH:  no palpable lymphadenopathy in the cervical, axillary or inguinal LUNGS: clear to auscultation and percussion with normal breathing effort HEART: regular rate & rhythm and no murmurs and no lower extremity edema ABDOMEN:abdomen soft,  and normal bowel sounds. Non-distended, (+) hepatomegaly has improved, about 6 cm below rib cage now  Musculoskeletal:no cyanosis of digits and no clubbing  NEURO: alert & oriented x 3 with fluent speech, no focal  motor/sensory deficits  LABORATORY DATA:  I have reviewed the data as listed  CBC Latest Ref Rng 12/15/2014 12/01/2014 11/24/2014  WBC 3.9 - 10.3 10e3/uL 5.5 8.2 8.6  Hemoglobin 11.6 - 15.9 g/dL 10.3(L) 10.9(L) 11.3(L)  Hematocrit 34.8 - 46.6 % 31.2(L) 34.3(L) 35.4  Platelets 145 - 400 10e3/uL 165 189 250    CMP Latest Ref Rng 12/15/2014 12/01/2014 11/24/2014  Glucose 70 - 140 mg/dl 88 89 91  BUN 7.0 - 26.0 mg/dL 10.9 17.8 8.4  Creatinine 0.6 - 1.1 mg/dL 0.7 0.7 0.7  Sodium 136 - 145 mEq/L 141 142 143  Potassium 3.5 - 5.1 mEq/L 4.2 4.3 4.0  Chloride 96 - 112 mEq/L - - -  CO2 22 - 29 mEq/L 25 27 27   Calcium 8.4 - 10.4 mg/dL 9.3 9.6 9.5  Total Protein 6.4 - 8.3 g/dL 6.5 6.8 6.7  Total Bilirubin 0.20 - 1.20 mg/dL 0.51 0.31 0.51  Alkaline Phos 40 - 150 U/L 90 90 96  AST 5 - 34 U/L 29 24 20   ALT 0 - 55 U/L 27 26 23    CA 19.9  Status: Finalresult Visible to  patient:  Not Released Nextappt: 12/01/2014 at 09:00 AM in Oncology [facility] Lab 1) Dx:  Metastatic adenocarcinoma to liver wi...              Ref Range 2d ago  2wk ago  67mo ago  68mo ago     CA 19-9 <35.0 U/mL 16993.2 (H) 25421.3 (H)CM 65508.4 (H)CM 86243.8 (H)CM           Surgical path 08/03/14 - ADENOCARCINOMA. Microscopic Comment The core biopsies are extensively involved by adenocarcinoma with focal goblet cells and foci with signet ring morphology. Immunohistochemistry will be performed and reported as an addendum. (JDP:gt, 08/04/14) Addendum: Immunohistochemistry is performed and the tumor is positive with Cytokeratin 7 and negative with Cytokeratin 20, CDX2, thyroid transcription factor-1, Napsin A, estrogen receptor, progesterone receptor and gross cystic disease fluid protein. The immunophenotype is nonspecific.  RADIOGRAPHIC STUDIES: I have personally reviewed the radiological images as listed and agreed with the findings in the report.  CT CHEST IMPRESSION 11/16/2014  1. Decreased  size of a low left jugular/supraclavicular node. This suggests response to therapy. 2. Otherwise, no evidence of metastatic disease within the chest. 3. Tiny left apical pulmonary nodule is likely similar and warrants followup attention. 4. Resolved pericardial and right pleural effusions. CT ABDOMEN AND PELVIS IMPRESSION 11/16/2014  1. Response to therapy of hepatic metastasis. 2. Similar nodal metastasis in the porta hepatis. 3. Marked decrease in abdominal pelvic fluid. 4. Hepatic morphology which may represent mild cirrhosis. Alternatively, this could be "Pseudocirrhosis" secondary to treated metastasis. 5. Possible constipation. 6. Mild bladder wall thickening, new. Correlate with urinary symptoms and possibly urinalysis to exclude urinary tract infection. .  I have reviewed her EGD and colonoscopy reports which was done on 11/20, which was negative for ulceration or mass.   ASSESSMENT & PLAN:  54 yo female with PMH of bipolar but otherwise healthy, no history of liver disease or alcohol abuse, no history of blood transfusion, who presented with significant N/V, abdominal pain and bloating, New discovered multiple liver masses and small ascites, and elevated serum beta HCG, CA125 and CEA.   1. 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 -The patient and her family understand the overall poor prognosis, and her treatment goal is palliative - Her case was reviewed in our GI tumor Board. The radiologist feels her liver lesions are most consistent with metastatic lesions, not typical for cholangiocarcinoma, but pathology morphology favors cholangiocarcinoma or pancreatic cancer. CA19.9 57,711. 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  -I reviewed her restaging CT on 11/16/14 which showed good partial response to treatment. She is clinically doing much better too.  -will  continue chemo, plan for a total of 4-6 cycles.   2. Anemia, probably related to her underline malignancy and anemia -Her anemia improved after blood transfusion, stable  -Continue monitoring  3. Bilateral leg edema -Dr. Burr Medico checked Doppler ultrasound on her legs which showed no evidence of DVT. -this is likely related to her hypoalbuminemia and her cancer -This is resolved now, with good tumor response to chemo   4. malignant ascites -She had paracentesis once --near resolved now, as part of tumor response to chemo    5. Malnutrition and deconditioning secondary to her probably underline malignancy  -She is encouraged to take nutrition supplements, and continue home PT.  -much Improved  6. Bipolar -stable mood, continue meds  7. Abdominal pain -Stable. Well controlled with short acting  oxycodone.  Plan -Cycle 4 day 1 chemotherapy today.  -Return to clinic in 3 weeks for cycle 5.   All questions were answered. The patient knows to call the clinic with any problems, questions or concerns. No barriers to learning was detected.  I spent 20 minutes counseling the patient face to face. The total time spent in the appointment was 25 minutes and more than 50% was on counseling and review of test results     Carlton Adam, PA-C 12/15/2014

## 2014-12-15 NOTE — Progress Notes (Signed)
1530-OK to run hydration fluids at 500 ml/hr post Cisplatin per Rollene Rotunda PA.

## 2014-12-15 NOTE — Progress Notes (Signed)
Labs drawn by phlebotomist

## 2014-12-15 NOTE — Patient Instructions (Signed)
[place] Discharge Instructions for Patients Receiving Chemotherapy  Today you received the following chemotherapy agents Cisplatin, Gemzar  To help prevent nausea and vomiting after your treatment, we encourage you to take your nausea medication as directed/prescribed  If you develop nausea and vomiting that is not controlled by your nausea medication, call the clinic.   BELOW ARE SYMPTOMS THAT SHOULD BE REPORTED IMMEDIATELY:  *FEVER GREATER THAN 100.5 F  *CHILLS WITH OR WITHOUT FEVER  NAUSEA AND VOMITING THAT IS NOT CONTROLLED WITH YOUR NAUSEA MEDICATION  *UNUSUAL SHORTNESS OF BREATH  *UNUSUAL BRUISING OR BLEEDING  TENDERNESS IN MOUTH AND THROAT WITH OR WITHOUT PRESENCE OF ULCERS  *URINARY PROBLEMS  *BOWEL PROBLEMS  UNUSUAL RASH Items with * indicate a potential emergency and should be followed up as soon as possible.  Feel free to call the clinic you have any questions or concerns. The clinic phone number is [phone].  Please show the [place] at check-in to the Emergency Department and triage nurse.

## 2014-12-15 NOTE — Progress Notes (Signed)
54 year old female diagnosed with metastatic cancer with metastases to her liver.  Chemotherapy is palliative in nature.  She is a patient of Dr. Burr Medico.  Past medical history includes mania, depression, thyroid disease, bipolar and headache.  Medications include Wellbutrin, Decadron, lactulose, Synthroid, Zofran, and Compazine.  Labs were reviewed.  Height: 66 inches. Weight: 115.6 pounds April 1. Usual body weight: 145 pounds December 2015. BMI: 18.67.  Met with patient and husband, during infusion. Patient reports appetite is somewhat improved.  Patient feels she is eating more and her weight is up approximately 3 pounds. Patient reports she has nausea but is controlled with nausea medication. Patient tried oral nutrition supplements but did not like them. Nutrition physical focused exam was deferred.  Nutrition diagnosis: Unintended weight loss related to metastatic cancer as evidenced by 21% weight loss over 3 months.  Intervention: Patient was educated to consume small frequent meals and snacks with high-calorie, high-protein foods.  Provided fact sheet Recommended patient try Carnation breakfast essentials for additional calories and protein and provided coupons and purchasing information. Educated patient on strategies for eating with nausea and vomiting. Fact sheet was provided. Also provided patient with recipes. Teach back method used.  Monitoring, evaluation, goals: Patient will work to increase calories and protein along with adding Carnation breakfast essentials for improved quality-of-life.  Next visit: Patient has my contact information as needed.  **Disclaimer: This note was dictated with voice recognition software. Similar sounding words can inadvertently be transcribed and this note may contain transcription errors which may not have been corrected upon publication of note.**

## 2014-12-19 NOTE — Patient Instructions (Signed)
Continue labs and chemotherapy as scheduled Follow up in 3 weeks, prior to the start of your next scheduled cycle of chemotherapy 

## 2014-12-20 ENCOUNTER — Encounter (HOSPITAL_COMMUNITY): Payer: Self-pay | Admitting: Psychiatry

## 2014-12-20 ENCOUNTER — Telehealth: Payer: Self-pay | Admitting: *Deleted

## 2014-12-20 ENCOUNTER — Ambulatory Visit (INDEPENDENT_AMBULATORY_CARE_PROVIDER_SITE_OTHER): Payer: Medicare HMO | Admitting: Psychiatry

## 2014-12-20 VITALS — BP 140/85 | HR 86 | Ht 66.0 in | Wt 111.4 lb

## 2014-12-20 DIAGNOSIS — F29 Unspecified psychosis not due to a substance or known physiological condition: Secondary | ICD-10-CM

## 2014-12-20 DIAGNOSIS — F410 Panic disorder [episodic paroxysmal anxiety] without agoraphobia: Secondary | ICD-10-CM

## 2014-12-20 DIAGNOSIS — F319 Bipolar disorder, unspecified: Secondary | ICD-10-CM | POA: Diagnosis not present

## 2014-12-20 MED ORDER — PROPRANOLOL HCL 10 MG PO TABS: 10.0000 mg | ORAL_TABLET | Freq: Three times a day (TID) | ORAL | Status: DC

## 2014-12-20 MED ORDER — BUPROPION HCL ER (XL) 300 MG PO TB24: 300.0000 mg | ORAL_TABLET | Freq: Every day | ORAL | Status: DC

## 2014-12-20 MED ORDER — RISPERIDONE 3 MG PO TABS: 3.0000 mg | ORAL_TABLET | Freq: Every day | ORAL | Status: DC

## 2014-12-20 MED ORDER — BENZTROPINE MESYLATE 1 MG PO TABS: 1.0000 mg | ORAL_TABLET | Freq: Every day | ORAL | Status: DC
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
## 2014-12-20 NOTE — Telephone Encounter (Signed)
Spoke with pt and husband today for symptom management.  Per pt, she is doing fine at present.  Per husband, pt had nausea and vomiting the first 2 days after chemo but doing better now.  Husband stated pt had 1 episode of nausea with small amount of vomitus this am.  Pt was given Compazine with relief.  Pt was able to keep fluids down.   Instructed husband to give pt antiemetics 30 min. Prior to meals to help with the nausea as needed.  Instructed husband to call office back in the am if pt develops more nausea/vomiting that not controlled by antiemetics.  Otherwise, pt will see Dr. Burr Medico on 4/8 after lab and prior to chemo.  Husband voiced understanding.

## 2014-12-21 ENCOUNTER — Other Ambulatory Visit: Payer: Self-pay | Admitting: *Deleted

## 2014-12-21 ENCOUNTER — Telehealth: Payer: Self-pay | Admitting: Hematology

## 2014-12-21 DIAGNOSIS — C801 Malignant (primary) neoplasm, unspecified: Principal | ICD-10-CM

## 2014-12-21 DIAGNOSIS — C787 Secondary malignant neoplasm of liver and intrahepatic bile duct: Secondary | ICD-10-CM

## 2014-12-21 MED ORDER — PROCHLORPERAZINE MALEATE 10 MG PO TABS: 10.0000 mg | ORAL_TABLET | Freq: Four times a day (QID) | ORAL | Status: DC | PRN

## 2014-12-21 NOTE — Telephone Encounter (Signed)
S/w pt's husband Kennith Center per 04/07 POF confirming labs/ov/chemo added for 04/08.Marland Kitchen... KJ

## 2014-12-22 ENCOUNTER — Ambulatory Visit (HOSPITAL_BASED_OUTPATIENT_CLINIC_OR_DEPARTMENT_OTHER): Payer: Medicare HMO

## 2014-12-22 ENCOUNTER — Encounter: Payer: Self-pay | Admitting: Nutrition

## 2014-12-22 ENCOUNTER — Telehealth: Payer: Self-pay | Admitting: Hematology

## 2014-12-22 ENCOUNTER — Ambulatory Visit (HOSPITAL_BASED_OUTPATIENT_CLINIC_OR_DEPARTMENT_OTHER): Payer: Medicare HMO | Admitting: Hematology

## 2014-12-22 ENCOUNTER — Encounter: Payer: Self-pay | Admitting: Hematology

## 2014-12-22 ENCOUNTER — Ambulatory Visit: Payer: Medicare HMO

## 2014-12-22 ENCOUNTER — Other Ambulatory Visit: Payer: Self-pay

## 2014-12-22 ENCOUNTER — Other Ambulatory Visit (HOSPITAL_BASED_OUTPATIENT_CLINIC_OR_DEPARTMENT_OTHER): Payer: Medicare HMO

## 2014-12-22 DIAGNOSIS — C787 Secondary malignant neoplasm of liver and intrahepatic bile duct: Secondary | ICD-10-CM

## 2014-12-22 DIAGNOSIS — D630 Anemia in neoplastic disease: Secondary | ICD-10-CM

## 2014-12-22 DIAGNOSIS — R180 Malignant ascites: Secondary | ICD-10-CM

## 2014-12-22 DIAGNOSIS — C801 Malignant (primary) neoplasm, unspecified: Secondary | ICD-10-CM | POA: Diagnosis not present

## 2014-12-22 DIAGNOSIS — D6481 Anemia due to antineoplastic chemotherapy: Secondary | ICD-10-CM

## 2014-12-22 DIAGNOSIS — Z5111 Encounter for antineoplastic chemotherapy: Secondary | ICD-10-CM | POA: Diagnosis not present

## 2014-12-22 DIAGNOSIS — R112 Nausea with vomiting, unspecified: Secondary | ICD-10-CM | POA: Diagnosis not present

## 2014-12-22 DIAGNOSIS — C799 Secondary malignant neoplasm of unspecified site: Secondary | ICD-10-CM

## 2014-12-22 DIAGNOSIS — Z95828 Presence of other vascular implants and grafts: Secondary | ICD-10-CM

## 2014-12-22 LAB — CBC WITH DIFFERENTIAL/PLATELET
BASO%: 0.4 % (ref 0.0–2.0)
BASOS ABS: 0 10*3/uL (ref 0.0–0.1)
EOS%: 0.3 % (ref 0.0–7.0)
Eosinophils Absolute: 0 10*3/uL (ref 0.0–0.5)
HCT: 26.7 % — ABNORMAL LOW (ref 34.8–46.6)
HGB: 8.9 g/dL — ABNORMAL LOW (ref 11.6–15.9)
LYMPH#: 1.5 10*3/uL (ref 0.9–3.3)
LYMPH%: 38.4 % (ref 14.0–49.7)
MCH: 32.1 pg (ref 25.1–34.0)
MCHC: 33.3 g/dL (ref 31.5–36.0)
MCV: 96.7 fL (ref 79.5–101.0)
MONO#: 0.3 10*3/uL (ref 0.1–0.9)
MONO%: 8.8 % (ref 0.0–14.0)
NEUT%: 52.1 % (ref 38.4–76.8)
NEUTROS ABS: 2 10*3/uL (ref 1.5–6.5)
Platelets: 209 10*3/uL (ref 145–400)
RBC: 2.76 10*6/uL — ABNORMAL LOW (ref 3.70–5.45)
RDW: 18.7 % — ABNORMAL HIGH (ref 11.2–14.5)
WBC: 3.9 10*3/uL (ref 3.9–10.3)

## 2014-12-22 LAB — MAGNESIUM (CC13): MAGNESIUM: 1.7 mg/dL (ref 1.5–2.5)

## 2014-12-22 LAB — COMPREHENSIVE METABOLIC PANEL (CC13)
ALT: 25 U/L (ref 0–55)
AST: 28 U/L (ref 5–34)
Albumin: 3.7 g/dL (ref 3.5–5.0)
Alkaline Phosphatase: 89 U/L (ref 40–150)
Anion Gap: 9 mEq/L (ref 3–11)
BILIRUBIN TOTAL: 0.41 mg/dL (ref 0.20–1.20)
BUN: 12.4 mg/dL (ref 7.0–26.0)
CO2: 26 mEq/L (ref 22–29)
CREATININE: 0.7 mg/dL (ref 0.6–1.1)
Calcium: 9.2 mg/dL (ref 8.4–10.4)
Chloride: 106 mEq/L (ref 98–109)
EGFR: >90 mL/min/{1.73_m2} (ref >90)
GLUCOSE: 94 mg/dL (ref 70–140)
POTASSIUM: 3.8 meq/L (ref 3.5–5.1)
Sodium: 140 mEq/L (ref 136–145)
Total Protein: 6.4 g/dL (ref 6.4–8.3)

## 2014-12-22 MED ORDER — PALONOSETRON HCL INJECTION 0.25 MG/5ML
INTRAVENOUS | Status: AC
  Filled 2014-12-22: qty 5

## 2014-12-22 MED ORDER — SODIUM CHLORIDE 0.9 % IJ SOLN
10.0000 mL | INTRAMUSCULAR | Status: DC | PRN
  Administered 2014-12-22: 10 mL via INTRAVENOUS
  Filled 2014-12-22: qty 10

## 2014-12-22 MED ORDER — HEPARIN SOD (PORK) LOCK FLUSH 100 UNIT/ML IV SOLN
500.0000 [IU] | Freq: Once | INTRAVENOUS | Status: AC | PRN
  Administered 2014-12-22: 500 [IU]
  Filled 2014-12-22: qty 5

## 2014-12-22 MED ORDER — SODIUM CHLORIDE 0.9 % IV SOLN
1000.0000 mg/m2 | Freq: Once | INTRAVENOUS | Status: AC
  Administered 2014-12-22: 1558 mg via INTRAVENOUS
  Filled 2014-12-22: qty 40.98

## 2014-12-22 MED ORDER — POTASSIUM CHLORIDE 2 MEQ/ML IV SOLN
Freq: Once | INTRAVENOUS | Status: AC
  Administered 2014-12-22: 09:00:00 via INTRAVENOUS
  Filled 2014-12-22: qty 10

## 2014-12-22 MED ORDER — SODIUM CHLORIDE 0.9 % IV SOLN
Freq: Once | INTRAVENOUS | Status: AC
  Administered 2014-12-22: 13:00:00 via INTRAVENOUS
  Filled 2014-12-22: qty 5

## 2014-12-22 MED ORDER — PALONOSETRON HCL INJECTION 0.25 MG/5ML
0.2500 mg | Freq: Once | INTRAVENOUS | Status: AC
  Administered 2014-12-22: 0.25 mg via INTRAVENOUS

## 2014-12-22 MED ORDER — SODIUM CHLORIDE 0.9 % IV SOLN
25.0000 mg/m2 | Freq: Once | INTRAVENOUS | Status: AC
  Administered 2014-12-22: 39 mg via INTRAVENOUS
  Filled 2014-12-22: qty 39

## 2014-12-22 MED ORDER — SODIUM CHLORIDE 0.9 % IJ SOLN
10.0000 mL | INTRAMUSCULAR | Status: DC | PRN
  Administered 2014-12-22: 10 mL
  Filled 2014-12-22: qty 10

## 2014-12-22 MED ORDER — SODIUM CHLORIDE 0.9 % IV SOLN
Freq: Once | INTRAVENOUS | Status: AC
  Administered 2014-12-22: 09:00:00 via INTRAVENOUS

## 2014-12-22 NOTE — Patient Instructions (Signed)

## 2014-12-22 NOTE — Telephone Encounter (Signed)
Gave avs & calendar for April. Also gave CT contrast for scan. °

## 2014-12-22 NOTE — Patient Instructions (Signed)
[facility] Discharge Instructions for Patients Receiving Chemotherapy  Today you received the following chemotherapy agents:  Gemzar and Cisplatin  To help prevent nausea and vomiting after your treatment, we encourage you to take your nausea medication as ordered per MD.   If you develop nausea and vomiting that is not controlled by your nausea medication, call the clinic.   BELOW ARE SYMPTOMS THAT SHOULD BE REPORTED IMMEDIATELY:  *FEVER GREATER THAN 100.5 F  *CHILLS WITH OR WITHOUT FEVER  NAUSEA AND VOMITING THAT IS NOT CONTROLLED WITH YOUR NAUSEA MEDICATION  *UNUSUAL SHORTNESS OF BREATH  *UNUSUAL BRUISING OR BLEEDING  TENDERNESS IN MOUTH AND THROAT WITH OR WITHOUT PRESENCE OF ULCERS  *URINARY PROBLEMS  *BOWEL PROBLEMS  UNUSUAL RASH Items with * indicate a potential emergency and should be followed up as soon as possible.  Feel free to call the clinic you have any questions or concerns. The clinic phone number is [phone].  Please show the [facility] at check-in to the Emergency Department and triage nurse.

## 2014-12-22 NOTE — Progress Notes (Signed)
[facility] OFFICE PROGRESS NOTE  Patient Care Team: Lorene Dy, MD as PCP - General (Internal Medicine)    Metastatic adenocarcinoma to liver with unknown primary site   07/25/2014 Tumor Marker CA125 843, CEA 23, AFP 1.9, betaHCG 89   07/26/2014 Imaging Extensive multi focal liver metastasis. Hepatosplenomegaly. Abdominal and pelvic ascites and Pathologically enlarged upper abdominal. CT chest (-).    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 Bipolar 2. Hypothyroidism   CURRENT THERAPY: Cisplatin 25 mg/m, gemcitabine 1000 mg/m, on day 1 and 8, every 21 days started on 10/10/2014  INTERVAL HISTORY: [name] returns for follow-up. She started cycle for chemotherapy last week. She had significant nausea and vomiting this week and he lost about 4 pounds. On her nausea and vomiting did slow down in the past few days. She denies any abdominal discomfort, no fever or chills, her bowel movement is normal. She is able to drink enough fluids at home. Appetite is slightly low this week and eats litter less.    REVIEW OF SYSTEMS:   Constitutional: Denies fevers, chills, (+)  fatigued  Eyes: Denies blurriness of vision Ears, nose, mouth, throat, and face: Denies mucositis or sore throat Respiratory: Denies cough, dyspnea or wheezes Cardiovascular: Denies palpitation, chest discomfort or lower extremity swelling Gastrointestinal:  (+) nausea, no heartburn or change in bowel habits Skin: Denies abnormal skin rashes.  Lymphatics: Denies new lymphadenopathy or easy  bruising Neurological:Denies numbness, tingling or new weaknesses Behavioral/Psych: Mood is stable, no new changes  All other systems were reviewed with the patient and are negative.  I have reviewed the past medical history, past surgical history, social history and family history with the patient and they are unchanged from previous note.  ALLERGIES:  is allergic to buspar; cymbalta; hydroxyzine; and sertraline.  MEDICATIONS:  Current Outpatient Prescriptions  Medication Sig Dispense Refill  . benztropine (COGENTIN) 1 MG tablet Take 1 tablet (1 mg total) by mouth at bedtime. 90 tablet 2  . buPROPion (WELLBUTRIN XL) 300 MG 24 hr tablet Take 1 tablet (300 mg total) by mouth daily. 90 tablet 2  . dexamethasone (DECADRON) 4 MG tablet Take 1 tablet (4 mg total) by mouth daily. (Patient not taking: Reported on 12/15/2014) 20 tablet 2  . lactulose (CHRONULAC) 10 GM/15ML solution 15 ml q 4 hr as needed for constipation.  Max of 60 ml per day (Patient taking differently: Take 10 g by mouth daily as needed. 30 ml q 4 hr as needed for constipation.  Max of 60 ml per day) 500 mL 2  . levothyroxine (SYNTHROID, LEVOTHROID) 88 MCG tablet Take 88 mcg by mouth at bedtime.     . lidocaine-prilocaine (EMLA) cream Apply 1 application topically as needed. Apply to port 1 hr before treatment as instructed. 30 g 0  . morphine (MSIR) 15 MG tablet Take 15 mg by mouth every 12 (twelve) hours as needed for moderate pain or severe pain.    Marland Kitchen ondansetron (ZOFRAN) 4 MG tablet Take 1 tablet (4 mg total) by mouth 4 (four) times daily as needed for nausea or vomiting. 60 tablet 0  . oxyCODONE (OXY IR/ROXICODONE) 5  MG immediate release tablet Take 1 tablet (5 mg total) by mouth every 4 (four) hours as needed for severe pain. 120 tablet 0  . prochlorperazine (COMPAZINE) 10 MG tablet Take 1 tablet (10 mg total) by mouth every 6 (six) hours as needed for nausea or vomiting. 30 tablet 2  . propranolol (INDERAL) 10 MG tablet Take 1  tablet (10 mg total) by mouth 3 (three) times daily. 270 tablet 2  . risperiDONE (RISPERDAL) 3 MG tablet Take 1 tablet (3 mg total) by mouth at bedtime. 90 tablet 2  . zolpidem (AMBIEN) 10 MG tablet Take 1 tablet (10 mg total) by mouth at bedtime as needed for sleep. 90 tablet 1   No current facility-administered medications for this visit.   Facility-Administered Medications Ordered in Other Visits  Medication Dose Route Frequency Provider Last Rate Last Dose  . Tbo-Filgrastim (GRANIX) injection 300 mcg  300 mcg Subcutaneous Once [name], MD        PHYSICAL EXAMINATION: ECOG PERFORMANCE STATUS: 2   There were no vitals filed for this visit. There were no vitals filed for this visit.  GENERAL:alert, no distress and comfortable SKIN: skin color, texture, turgor are normal, no rashes or significant lesions EYES: normal, Conjunctiva are pink and non-injected, sclera clear OROPHARYNX:no exudate, no erythema and lips, buccal mucosa, and tongue normal  NECK: supple, thyroid normal size, non-tender, without nodularity LYMPH:  no palpable lymphadenopathy in the cervical, axillary or inguinal LUNGS: clear to auscultation and percussion with normal breathing effort HEART: regular rate & rhythm and no murmurs and no lower extremity edema ABDOMEN:abdomen soft,  and normal bowel sounds. Non-distended, (+) hepatomegaly has improved, about 6 cm below rib cage now  Musculoskeletal:no cyanosis of digits and no clubbing  NEURO: alert & oriented x 3 with fluent speech, no focal motor/sensory deficits  LABORATORY DATA:  I have reviewed the data as listed  CBC Latest Ref Rng 12/22/2014 12/15/2014 12/01/2014  WBC 3.9 - 10.3 10e3/uL 3.9 5.5 8.2  Hemoglobin 11.6 - 15.9 g/dL 8.9(L) 10.3(L) 10.9(L)  Hematocrit 34.8 - 46.6 % 26.7(L) 31.2(L) 34.3(L)  Platelets 145 - 400 10e3/uL 209 165 189    CMP Latest Ref Rng 12/22/2014 12/15/2014 12/01/2014  Glucose 70 - 140 mg/dl 94 88 89  BUN 7.0 - 26.0 mg/dL 12.4 10.9 17.8   Creatinine 0.6 - 1.1 mg/dL 0.7 0.7 0.7  Sodium 136 - 145 mEq/L 140 141 142  Potassium 3.5 - 5.1 mEq/L 3.8 4.2 4.3  Chloride 96 - 112 mEq/L - - -  CO2 22 - 29 mEq/L 26 25 27   Calcium 8.4 - 10.4 mg/dL 9.2 9.3 9.6  Total Protein 6.4 - 8.3 g/dL 6.4 6.5 6.8  Total Bilirubin 0.20 - 1.20 mg/dL 0.41 0.51 0.31  Alkaline Phos 40 - 150 U/L 89 90 90  AST 5 - 34 U/L 28 29 24   ALT 0 - 55 U/L 25 27 26    CA 19.9  Status: Finalresult Visible to patient:  Not Released Nextappt: 12/01/2014 at 09:00 AM in Oncology [facility] 1) Dx:  Metastatic adenocarcinoma to liver wi...              Ref Range 2d ago  2wk ago  31mo ago  66mo ago     CA 19-9 <35.0 U/mL 16993.2 (H) 25421.3 (H)CM 65508.4 (H)CM 86243.8 (H)CM           Surgical path 08/03/14 - ADENOCARCINOMA. Microscopic Comment The core biopsies are extensively involved by adenocarcinoma with focal goblet  cells and foci with signet ring morphology. Immunohistochemistry will be performed and reported as an addendum. (JDP:gt, 08/04/14) Addendum: Immunohistochemistry is performed and the tumor is positive with Cytokeratin 7 and negative with Cytokeratin 20, CDX2, thyroid transcription factor-1, Napsin A, estrogen receptor, progesterone receptor and gross cystic disease fluid protein. The immunophenotype is nonspecific.  RADIOGRAPHIC STUDIES: I have personally reviewed the radiological images as listed and agreed with the findings in the report.  CT CHEST IMPRESSION 11/16/2014  1. Decreased size of a low left jugular/supraclavicular node. This suggests response to therapy. 2. Otherwise, no evidence of metastatic disease within the chest. 3. Tiny left apical pulmonary nodule is likely similar and warrants followup attention. 4. Resolved pericardial and right pleural effusions. CT ABDOMEN AND PELVIS IMPRESSION 11/16/2014  1. Response to therapy of hepatic metastasis. 2. Similar nodal metastasis in the porta  hepatis. 3. Marked decrease in abdominal pelvic fluid. 4. Hepatic morphology which may represent mild cirrhosis. Alternatively, this could be "Pseudocirrhosis" secondary to treated metastasis. 5. Possible constipation. 6. Mild bladder wall thickening, new. Correlate with urinary symptoms and possibly urinalysis to exclude urinary tract infection. .  I have reviewed her EGD and colonoscopy reports which was done on 11/20, which was negative for ulceration or mass.   ASSESSMENT & PLAN:  54 yo female with PMH of bipolar but otherwise healthy, no history of liver disease or alcohol abuse, no history of blood transfusion, who presented with significant N/V, abdominal pain and bloating, New discovered multiple liver masses and small ascites, and elevated serum beta HCG, CA125 and CEA.   1. 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 -The patient and her family understand the overall poor prognosis, and her treatment goal is palliative - Her case was reviewed in our GI tumor Board. The radiologist feels her liver lesions are most consistent with metastatic lesions, not typical for cholangiocarcinoma, but pathology morphology favors cholangiocarcinoma or pancreatic cancer. CA19.9 57,711. 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  -I reviewed her restaging CT on 11/16/14 which showed good partial response to treatment. She is clinically doing much better too.  -will continue chemo.   2. Nausea and vomiting -Likely related to chemotherapy. -I suggest her to use Zofran and Compazine alternatively, 20-30 minutes before each meal. -She knows to take nutrition supplement, high-protein high calorie diet, and drink adequate fluids.  3. Anemia, related to chemotherapy and underline malignancy  -Her anemia improved after blood transfusion, stable  -Continue monitoring  3. Bilateral leg  edema -I checked Doppler ultrasound on her legs which showed no evidence of DVT. -this is likely related to her hypoalbuminemia and her cancer -This is resolved now, with good tumor response to chemo   4. malignant ascites -She had paracentesis once --near resolved now, as part of tumor response to chemo    5. Malnutrition and deconditioning secondary to her probably underline malignancy  -I encouraged her to take nutrition supplement, and continue home PT.  -much Improved  6. Bipolar -stable mood, continue meds  7. Abdominal pain -Improved and controlled. She still has very high co-pay for OxyContin and she could not afford it. Her pain seemed very well controlled by oxycodone, I instructed her to take only as needed, she takes one tablet every other day.  Plan -Cycle 4 day 8 chemotherapy today.  -Return to clinic in 2 weeks for cycle 5, CT scan before next visit.   All questions were answered.  The patient knows to call the clinic with any problems, questions or concerns. No barriers to learning was detected.  I spent 20 minutes counseling the patient face to face. The total time spent in the appointment was 25 minutes and more than 50% was on counseling and review of test results     [name], MD 12/22/2014

## 2014-12-23 LAB — CANCER ANTIGEN 19-9: CA 19 9: 7631 U/mL — AB (ref <35.0)

## 2015-01-03 ENCOUNTER — Encounter (HOSPITAL_COMMUNITY): Payer: Self-pay

## 2015-01-03 ENCOUNTER — Ambulatory Visit (HOSPITAL_COMMUNITY)
Admission: RE | Admit: 2015-01-03 | Discharge: 2015-01-03 | Disposition: A | Discharge status: Home / Self Care | Payer: Medicare HMO | Source: Ambulatory Visit | Attending: Hematology | Admitting: Hematology

## 2015-01-03 DIAGNOSIS — C801 Malignant (primary) neoplasm, unspecified: Secondary | ICD-10-CM | POA: Diagnosis not present

## 2015-01-03 DIAGNOSIS — C787 Secondary malignant neoplasm of liver and intrahepatic bile duct: Secondary | ICD-10-CM | POA: Diagnosis not present

## 2015-01-03 MED ORDER — IOHEXOL 300 MG/ML  SOLN
100.0000 mL | Freq: Once | INTRAMUSCULAR | Status: AC | PRN
  Administered 2015-01-03: 100 mL via INTRAVENOUS

## 2015-01-05 ENCOUNTER — Other Ambulatory Visit (HOSPITAL_BASED_OUTPATIENT_CLINIC_OR_DEPARTMENT_OTHER): Payer: Medicare HMO

## 2015-01-05 ENCOUNTER — Telehealth: Payer: Self-pay | Admitting: Hematology

## 2015-01-05 ENCOUNTER — Ambulatory Visit (HOSPITAL_BASED_OUTPATIENT_CLINIC_OR_DEPARTMENT_OTHER): Payer: Medicare HMO

## 2015-01-05 ENCOUNTER — Encounter: Payer: Self-pay | Admitting: Hematology

## 2015-01-05 ENCOUNTER — Ambulatory Visit: Payer: Medicare HMO

## 2015-01-05 ENCOUNTER — Ambulatory Visit (HOSPITAL_BASED_OUTPATIENT_CLINIC_OR_DEPARTMENT_OTHER): Payer: Medicare HMO | Admitting: Hematology

## 2015-01-05 VITALS — BP 134/89 | HR 66 | Temp 97.5°F | Resp 18 | Ht 66.0 in | Wt 115.4 lb

## 2015-01-05 DIAGNOSIS — R112 Nausea with vomiting, unspecified: Secondary | ICD-10-CM

## 2015-01-05 DIAGNOSIS — C801 Malignant (primary) neoplasm, unspecified: Secondary | ICD-10-CM | POA: Diagnosis not present

## 2015-01-05 DIAGNOSIS — C787 Secondary malignant neoplasm of liver and intrahepatic bile duct: Secondary | ICD-10-CM | POA: Diagnosis not present

## 2015-01-05 DIAGNOSIS — Z5111 Encounter for antineoplastic chemotherapy: Secondary | ICD-10-CM

## 2015-01-05 DIAGNOSIS — D6481 Anemia due to antineoplastic chemotherapy: Secondary | ICD-10-CM

## 2015-01-05 DIAGNOSIS — R180 Malignant ascites: Secondary | ICD-10-CM

## 2015-01-05 DIAGNOSIS — Z95828 Presence of other vascular implants and grafts: Secondary | ICD-10-CM

## 2015-01-05 DIAGNOSIS — D630 Anemia in neoplastic disease: Secondary | ICD-10-CM

## 2015-01-05 LAB — COMPREHENSIVE METABOLIC PANEL (CC13)
ALBUMIN: 3.4 g/dL — AB (ref 3.5–5.0)
ALK PHOS: 78 U/L (ref 40–150)
ALT: 17 U/L (ref 0–55)
AST: 19 U/L (ref 5–34)
Anion Gap: 12 mEq/L — ABNORMAL HIGH (ref 3–11)
BUN: 11.9 mg/dL (ref 7.0–26.0)
CO2: 22 mEq/L (ref 22–29)
Calcium: 8.9 mg/dL (ref 8.4–10.4)
Chloride: 107 mEq/L (ref 98–109)
Creatinine: 0.7 mg/dL (ref 0.6–1.1)
EGFR: >90 mL/min/{1.73_m2} (ref >90)
GLUCOSE: 91 mg/dL (ref 70–140)
Potassium: 4.2 mEq/L (ref 3.5–5.1)
Sodium: 141 mEq/L (ref 136–145)
TOTAL PROTEIN: 6.4 g/dL (ref 6.4–8.3)
Total Bilirubin: 0.43 mg/dL (ref 0.20–1.20)

## 2015-01-05 LAB — CBC WITH DIFFERENTIAL/PLATELET
BASO%: 0.5 % (ref 0.0–2.0)
BASOS ABS: 0 10*3/uL (ref 0.0–0.1)
EOS ABS: 0 10*3/uL (ref 0.0–0.5)
EOS%: 0.6 % (ref 0.0–7.0)
HCT: 28.5 % — ABNORMAL LOW (ref 34.8–46.6)
HGB: 9.5 g/dL — ABNORMAL LOW (ref 11.6–15.9)
LYMPH%: 26.1 % (ref 14.0–49.7)
MCH: 33.7 pg (ref 25.1–34.0)
MCHC: 33.2 g/dL (ref 31.5–36.0)
MCV: 101.5 fL — ABNORMAL HIGH (ref 79.5–101.0)
MONO#: 0.8 10*3/uL (ref 0.1–0.9)
MONO%: 13.6 % (ref 0.0–14.0)
NEUT#: 3.5 10*3/uL (ref 1.5–6.5)
NEUT%: 59.2 % (ref 38.4–76.8)
Platelets: 162 10*3/uL (ref 145–400)
RBC: 2.81 10*6/uL — ABNORMAL LOW (ref 3.70–5.45)
RDW: 21.8 % — ABNORMAL HIGH (ref 11.2–14.5)
WBC: 5.9 10*3/uL (ref 3.9–10.3)
lymph#: 1.6 10*3/uL (ref 0.9–3.3)

## 2015-01-05 MED ORDER — SODIUM CHLORIDE 0.9 % IJ SOLN
10.0000 mL | INTRAMUSCULAR | Status: DC | PRN
  Administered 2015-01-05: 10 mL via INTRAVENOUS
  Filled 2015-01-05: qty 10

## 2015-01-05 MED ORDER — SODIUM CHLORIDE 0.9 % IV SOLN
Freq: Once | INTRAVENOUS | Status: AC
  Administered 2015-01-05: 13:00:00 via INTRAVENOUS
  Filled 2015-01-05: qty 5

## 2015-01-05 MED ORDER — SODIUM CHLORIDE 0.9 % IV SOLN
Freq: Once | INTRAVENOUS | Status: AC
  Administered 2015-01-05: 11:00:00 via INTRAVENOUS

## 2015-01-05 MED ORDER — HEPARIN SOD (PORK) LOCK FLUSH 100 UNIT/ML IV SOLN
500.0000 [IU] | Freq: Once | INTRAVENOUS | Status: AC
  Administered 2015-01-05: 500 [IU] via INTRAVENOUS
  Filled 2015-01-05: qty 5

## 2015-01-05 MED ORDER — SODIUM CHLORIDE 0.9 % IV SOLN
25.0000 mg/m2 | Freq: Once | INTRAVENOUS | Status: AC
  Administered 2015-01-05: 39 mg via INTRAVENOUS
  Filled 2015-01-05: qty 39

## 2015-01-05 MED ORDER — SODIUM CHLORIDE 0.9 % IV SOLN
1000.0000 mg/m2 | Freq: Once | INTRAVENOUS | Status: AC
  Administered 2015-01-05: 1558 mg via INTRAVENOUS
  Filled 2015-01-05: qty 40.98

## 2015-01-05 MED ORDER — PALONOSETRON HCL INJECTION 0.25 MG/5ML
INTRAVENOUS | Status: AC
  Filled 2015-01-05: qty 5

## 2015-01-05 MED ORDER — POTASSIUM CHLORIDE 2 MEQ/ML IV SOLN
Freq: Once | INTRAVENOUS | Status: AC
  Administered 2015-01-05: 11:00:00 via INTRAVENOUS
  Filled 2015-01-05: qty 10

## 2015-01-05 MED ORDER — PALONOSETRON HCL INJECTION 0.25 MG/5ML
0.2500 mg | Freq: Once | INTRAVENOUS | Status: AC
  Administered 2015-01-05: 0.25 mg via INTRAVENOUS

## 2015-01-05 NOTE — Patient Instructions (Signed)

## 2015-01-05 NOTE — Patient Instructions (Signed)
[facility] Discharge Instructions for Patients Receiving Chemotherapy  Today you received the following chemotherapy agents cisplatin/gemzar   To help prevent nausea and vomiting after your treatment, we encourage you to take your nausea medication as directed  If you develop nausea and vomiting that is not controlled by your nausea medication, call the clinic.   BELOW ARE SYMPTOMS THAT SHOULD BE REPORTED IMMEDIATELY:  *FEVER GREATER THAN 100.5 F  *CHILLS WITH OR WITHOUT FEVER  NAUSEA AND VOMITING THAT IS NOT CONTROLLED WITH YOUR NAUSEA MEDICATION  *UNUSUAL SHORTNESS OF BREATH  *UNUSUAL BRUISING OR BLEEDING  TENDERNESS IN MOUTH AND THROAT WITH OR WITHOUT PRESENCE OF ULCERS  *URINARY PROBLEMS  *BOWEL PROBLEMS  UNUSUAL RASH Items with * indicate a potential emergency and should be followed up as soon as possible.  Feel free to call the clinic you have any questions or concerns. The clinic phone number is [phone].  

## 2015-01-05 NOTE — Telephone Encounter (Signed)
Gave and rpinted appt sched and avs for pt for April and May....sed Added tx.

## 2015-01-05 NOTE — Progress Notes (Signed)
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 Hepatosplenomegaly. Abdominal and pelvic ascites and Pathologically enlarged upper abdominal. CT chest (-).    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 Bipolar 2. Hypothyroidism   CURRENT THERAPY: Cisplatin 25 mg/m, gemcitabine 1000 mg/m, on day 1 and 8, every 21 days started on 10/10/2014  INTERVAL HISTORY: [name] returns for follow-up. She started cycle for chemotherapy last week. She had significant nausea and vomiting this week and he lost about 4 pounds. On her nausea and vomiting did slow down in the past few days. She denies any abdominal discomfort, no fever or chills, her bowel movement is normal. She is able to drink enough fluids at home. Appetite is slightly low this week and eats litter less.    REVIEW OF SYSTEMS:   Constitutional: Denies fevers, chills, (+)  fatigued  Eyes: Denies blurriness of vision Ears, nose, mouth, throat, and face: Denies mucositis or sore throat Respiratory: Denies cough, dyspnea or wheezes Cardiovascular: Denies palpitation, chest discomfort or lower extremity swelling Gastrointestinal:  (+) nausea, no  heartburn or change in bowel habits Skin: Denies abnormal skin rashes.  Lymphatics: Denies new lymphadenopathy or easy bruising Neurological:Denies numbness, tingling or new weaknesses Behavioral/Psych: Mood is stable, no new changes  All other systems were reviewed with the patient and are negative.  I have reviewed the past medical history, past surgical history, social history and family history with the patient and they are unchanged from previous note.  ALLERGIES:  is allergic to buspar; cymbalta; hydroxyzine; and sertraline.  MEDICATIONS:  Current Outpatient Prescriptions  Medication Sig Dispense Refill  . benztropine (COGENTIN) 1 MG tablet Take 1 tablet (1 mg total) by mouth at bedtime. 90 tablet 2  . buPROPion (WELLBUTRIN XL) 300 MG 24 hr tablet Take 1 tablet (300 mg total) by mouth daily. 90 tablet 2  . lactulose (CHRONULAC) 10 GM/15ML solution 15 ml q 4 hr as needed for constipation.  Max of 60 ml per day (Patient taking differently: Take 10 g by mouth daily as needed. 30 ml q 4 hr as needed for constipation.  Max of 60 ml per day) 500 mL 2  . levothyroxine (SYNTHROID, LEVOTHROID) 88 MCG tablet Take 88 mcg by mouth at bedtime.     . lidocaine-prilocaine (EMLA) cream Apply 1 application topically as needed. Apply to port 1 hr before treatment as instructed. 30 g 0  . morphine (MSIR) 15 MG tablet Take 15 mg by mouth every 12 (twelve) hours as needed for moderate pain or severe pain.    Marland Kitchen ondansetron (ZOFRAN) 4 MG tablet Take 1 tablet (4 mg total) by mouth 4 (four) times daily as needed for nausea or vomiting. 60 tablet 0  . oxyCODONE (OXY IR/ROXICODONE) 5 MG immediate release tablet  Take 1 tablet (5 mg total) by mouth every 4 (four) hours as needed for severe pain. 120 tablet 0  . prochlorperazine (COMPAZINE) 10 MG tablet Take 1 tablet (10 mg total) by mouth every 6 (six) hours as needed for nausea or vomiting. 30 tablet 2  . propranolol (INDERAL) 10 MG tablet Take 1 tablet (10 mg  total) by mouth 3 (three) times daily. 270 tablet 2  . risperiDONE (RISPERDAL) 3 MG tablet Take 1 tablet (3 mg total) by mouth at bedtime. 90 tablet 2  . zolpidem (AMBIEN) 10 MG tablet Take 1 tablet (10 mg total) by mouth at bedtime as needed for sleep. 90 tablet 1   No current facility-administered medications for this visit.   Facility-Administered Medications Ordered in Other Visits  Medication Dose Route Frequency Provider Last Rate Last Dose  . Tbo-Filgrastim (GRANIX) injection 300 mcg  300 mcg Subcutaneous Once [name], MD        PHYSICAL EXAMINATION: ECOG PERFORMANCE STATUS: 2   Filed Vitals:   01/05/15 0924  BP: 134/89  Pulse: 66  Temp: 97.5 F (36.4 C)  Resp: 18   Filed Weights   01/05/15 0924  Weight: 115 lb 6.4 oz (52.345 kg)    GENERAL:alert, no distress and comfortable SKIN: skin color, texture, turgor are normal, no rashes or significant lesions EYES: normal, Conjunctiva are pink and non-injected, sclera clear OROPHARYNX:no exudate, no erythema and lips, buccal mucosa, and tongue normal  NECK: supple, thyroid normal size, non-tender, without nodularity LYMPH:  no palpable lymphadenopathy in the cervical, axillary or inguinal LUNGS: clear to auscultation and percussion with normal breathing effort HEART: regular rate & rhythm and no murmurs and no lower extremity edema ABDOMEN:abdomen soft,  and normal bowel sounds. Non-distended, (+) hepatomegaly has improved, about 6 cm below rib cage now  Musculoskeletal:no cyanosis of digits and no clubbing  NEURO: alert & oriented x 3 with fluent speech, no focal motor/sensory deficits  LABORATORY DATA:  I have reviewed the data as listed  CBC Latest Ref Rng 01/05/2015 12/22/2014 12/15/2014  WBC 3.9 - 10.3 10e3/uL 5.9 3.9 5.5  Hemoglobin 11.6 - 15.9 g/dL 9.5(L) 8.9(L) 10.3(L)  Hematocrit 34.8 - 46.6 % 28.5(L) 26.7(L) 31.2(L)  Platelets 145 - 400 10e3/uL 162 209 165    CMP Latest Ref Rng 01/05/2015 12/22/2014 12/15/2014    Glucose 70 - 140 mg/dl 91 94 88  BUN 7.0 - 26.0 mg/dL 11.9 12.4 10.9  Creatinine 0.6 - 1.1 mg/dL 0.7 0.7 0.7  Sodium 136 - 145 mEq/L 141 140 141  Potassium 3.5 - 5.1 mEq/L 4.2 3.8 4.2  Chloride 96 - 112 mEq/L - - -  CO2 22 - 29 mEq/L 22 26 25   Calcium 8.4 - 10.4 mg/dL 8.9 9.2 9.3  Total Protein 6.4 - 8.3 g/dL 6.4 6.4 6.5  Total Bilirubin 0.20 - 1.20 mg/dL 0.43 0.41 0.51  Alkaline Phos 40 - 150 U/L 78 89 90  AST 5 - 34 U/L 19 28 29   ALT 0 - 55 U/L 17 25 27    CA 19.9  Status: Finalresult Visible to patient:  MyChart Nextappt: Today at 10:15 AM in Oncology ([facility]) Dx:  Metastatic adenocarcinoma              Ref Range 2wk ago (12/22/14) 37mo ago (12/01/14) 29mo ago (11/24/14)    CA 19-9 <35.0 U/mL 7631.0 (H) 15297.2 (H)CM 16993.2 (H)CM           Surgical path 08/03/14 - ADENOCARCINOMA. Microscopic Comment The  core biopsies are extensively involved by adenocarcinoma with focal goblet cells and foci with signet ring morphology. Immunohistochemistry will be performed and reported as an addendum. (JDP:gt, 08/04/14) Addendum: Immunohistochemistry is performed and the tumor is positive with Cytokeratin 7 and negative with Cytokeratin 20, CDX2, thyroid transcription factor-1, Napsin A, estrogen receptor, progesterone receptor and gross cystic disease fluid protein. The immunophenotype is nonspecific.  RADIOGRAPHIC STUDIES: I have personally reviewed the radiological images as listed and agreed with the findings in the report.  CT CHEST, ABDOMEN AND PELVIS IMPRESSION 01/03/2015 IMPRESSION: 1. Stable appearance of the small left supraclavicular lymph node. 2. Interval decrease in size of the hepatic metastases. No new or progressive liver lesions are evident. 3. Necrotic portal caval lymph node shows no substantial interval change. 4. Persistent abnormal soft tissue attenuation encasing the celiac axis and SMA.   I have reviewed her EGD and  colonoscopy reports which was done on 11/20, which was negative for ulceration or mass.   ASSESSMENT & PLAN:  54 yo female with PMH of bipolar but otherwise healthy, no history of liver disease or alcohol abuse, no history of blood transfusion, who presented with significant N/V, abdominal pain and bloating, New discovered multiple liver masses and small ascites, and elevated serum beta HCG, CA125 and CEA.   1. 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 -The patient and her family understand the overall poor prognosis, and her treatment goal is palliative - Her case was reviewed in our GI tumor Board. The radiologist feels her liver lesions are most consistent with metastatic lesions, not typical for cholangiocarcinoma, but pathology morphology favors cholangiocarcinoma or pancreatic cancer. CA19.9 57,711. 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  -I reviewed her restaging CT on 01/03/2015 which showed continue responding to treatment, no new lesions or ascites.. She is clinically doing much better too.  -Tumor marker CA 19.9 significantly trending down, consistent with good response to chemotherapy -will continue chemo, planning for up to 8 cycles -Today's lab work reviewed, adequate for treatment. We'll proceed cycle 5 today  2. Nausea and vomiting -improved lately  -Likely related to chemotherapy. -I suggest her to use Zofran and Compazine alternatively, 20-30 minutes before each meal. -She knows to take nutrition supplement, high-protein high calorie diet, and drink adequate fluids.  3. Anemia, related to chemotherapy and underline malignancy  -Her anemia improved after blood transfusion, stable  -Continue monitoring  3. Bilateral leg edema -I checked Doppler ultrasound on her legs which showed no evidence of DVT. -this is likely related to her hypoalbuminemia and her  cancer -This is resolved now, with good tumor response to chemo   4. malignant ascites -She had paracentesis once --near resolved now, as part of tumor response to chemo    5. Malnutrition and deconditioning secondary to her probably underline malignancy  -I encouraged her to take nutrition supplement, and continue home PT.  -much Improved  6. Bipolar -stable mood, continue meds  7. Abdominal pain -Improved and controlled. She still has very high co-pay for OxyContin and she could not afford it. Her pain seemed very well controlled by oxycodone, I instructed her to take only as needed, she takes one tablet every other day.  Plan -Cycle 5 day 1 chemotherapy today.  -Return to clinic in 3 weeks for cycle 6.   All questions were answered. The patient knows to call the clinic with any problems, questions or concerns. No barriers to  learning was detected.  I spent 20 minutes counseling the patient face to face. The total time spent in the appointment was 25 minutes and more than 50% was on counseling and review of test results     [name], MD 01/05/2015

## 2015-01-12 ENCOUNTER — Ambulatory Visit: Payer: Medicare HMO

## 2015-01-12 ENCOUNTER — Ambulatory Visit (HOSPITAL_BASED_OUTPATIENT_CLINIC_OR_DEPARTMENT_OTHER): Payer: Medicare HMO

## 2015-01-12 ENCOUNTER — Other Ambulatory Visit (HOSPITAL_BASED_OUTPATIENT_CLINIC_OR_DEPARTMENT_OTHER): Payer: Medicare HMO

## 2015-01-12 VITALS — BP 142/85 | HR 70 | Temp 97.8°F | Resp 16

## 2015-01-12 DIAGNOSIS — D630 Anemia in neoplastic disease: Secondary | ICD-10-CM | POA: Diagnosis not present

## 2015-01-12 DIAGNOSIS — C799 Secondary malignant neoplasm of unspecified site: Secondary | ICD-10-CM

## 2015-01-12 DIAGNOSIS — C801 Malignant (primary) neoplasm, unspecified: Secondary | ICD-10-CM

## 2015-01-12 DIAGNOSIS — C787 Secondary malignant neoplasm of liver and intrahepatic bile duct: Secondary | ICD-10-CM

## 2015-01-12 DIAGNOSIS — Z5111 Encounter for antineoplastic chemotherapy: Secondary | ICD-10-CM

## 2015-01-12 DIAGNOSIS — Z95828 Presence of other vascular implants and grafts: Secondary | ICD-10-CM

## 2015-01-12 LAB — MAGNESIUM (CC13): Magnesium: 1.9 mg/dl (ref 1.5–2.5)

## 2015-01-12 LAB — CBC WITH DIFFERENTIAL/PLATELET
BASO%: 0.4 % (ref 0.0–2.0)
BASOS ABS: 0 10*3/uL (ref 0.0–0.1)
EOS%: 0.2 % (ref 0.0–7.0)
Eosinophils Absolute: 0 10*3/uL (ref 0.0–0.5)
HCT: 26.1 % — ABNORMAL LOW (ref 34.8–46.6)
HGB: 8.6 g/dL — ABNORMAL LOW (ref 11.6–15.9)
LYMPH#: 2 10*3/uL (ref 0.9–3.3)
LYMPH%: 43.6 % (ref 14.0–49.7)
MCH: 34.3 pg — ABNORMAL HIGH (ref 25.1–34.0)
MCHC: 33 g/dL (ref 31.5–36.0)
MCV: 104 fL — AB (ref 79.5–101.0)
MONO#: 0.5 10*3/uL (ref 0.1–0.9)
MONO%: 11.3 % (ref 0.0–14.0)
NEUT#: 2 10*3/uL (ref 1.5–6.5)
NEUT%: 44.5 % (ref 38.4–76.8)
PLATELETS: 178 10*3/uL (ref 145–400)
RBC: 2.51 10*6/uL — ABNORMAL LOW (ref 3.70–5.45)
RDW: 17.6 % — AB (ref 11.2–14.5)
WBC: 4.6 10*3/uL (ref 3.9–10.3)

## 2015-01-12 LAB — COMPREHENSIVE METABOLIC PANEL (CC13)
ALK PHOS: 109 U/L (ref 40–150)
ALT: 191 U/L — ABNORMAL HIGH (ref 0–55)
AST: 191 U/L — AB (ref 5–34)
Albumin: 3.5 g/dL (ref 3.5–5.0)
Anion Gap: 10 mEq/L (ref 3–11)
BUN: 17.8 mg/dL (ref 7.0–26.0)
CHLORIDE: 105 meq/L (ref 98–109)
CO2: 25 mEq/L (ref 22–29)
CREATININE: 0.8 mg/dL (ref 0.6–1.1)
Calcium: 9 mg/dL (ref 8.4–10.4)
EGFR: 85 mL/min/{1.73_m2} — AB (ref >90)
GLUCOSE: 92 mg/dL (ref 70–140)
POTASSIUM: 4.2 meq/L (ref 3.5–5.1)
SODIUM: 139 meq/L (ref 136–145)
TOTAL PROTEIN: 6.6 g/dL (ref 6.4–8.3)
Total Bilirubin: 0.38 mg/dL (ref 0.20–1.20)

## 2015-01-12 LAB — CANCER ANTIGEN 19-9: CA 19-9: 5087.6 U/mL — ABNORMAL HIGH (ref <35.0)

## 2015-01-12 MED ORDER — PALONOSETRON HCL INJECTION 0.25 MG/5ML
0.2500 mg | Freq: Once | INTRAVENOUS | Status: AC
  Administered 2015-01-12: 0.25 mg via INTRAVENOUS

## 2015-01-12 MED ORDER — SODIUM CHLORIDE 0.9 % IJ SOLN
10.0000 mL | INTRAMUSCULAR | Status: DC | PRN
  Administered 2015-01-12: 10 mL via INTRAVENOUS
  Filled 2015-01-12: qty 10

## 2015-01-12 MED ORDER — SODIUM CHLORIDE 0.9 % IV SOLN
25.0000 mg/m2 | Freq: Once | INTRAVENOUS | Status: AC
  Administered 2015-01-12: 39 mg via INTRAVENOUS
  Filled 2015-01-12: qty 39

## 2015-01-12 MED ORDER — SODIUM CHLORIDE 0.9 % IJ SOLN
10.0000 mL | INTRAMUSCULAR | Status: DC | PRN
Start: 2015-01-12 — End: 2015-01-12
  Administered 2015-01-12: 10 mL
  Filled 2015-01-12: qty 10

## 2015-01-12 MED ORDER — PALONOSETRON HCL INJECTION 0.25 MG/5ML
INTRAVENOUS | Status: AC
  Filled 2015-01-12: qty 5

## 2015-01-12 MED ORDER — POTASSIUM CHLORIDE 2 MEQ/ML IV SOLN
Freq: Once | INTRAVENOUS | Status: AC
  Administered 2015-01-12: 10:00:00 via INTRAVENOUS
  Filled 2015-01-12: qty 10

## 2015-01-12 MED ORDER — SODIUM CHLORIDE 0.9 % IV SOLN
Freq: Once | INTRAVENOUS | Status: AC
  Administered 2015-01-12: 12:00:00 via INTRAVENOUS
  Filled 2015-01-12: qty 5

## 2015-01-12 MED ORDER — SODIUM CHLORIDE 0.9 % IV SOLN
Freq: Once | INTRAVENOUS | Status: AC
  Administered 2015-01-12: 10:00:00 via INTRAVENOUS

## 2015-01-12 MED ORDER — HEPARIN SOD (PORK) LOCK FLUSH 100 UNIT/ML IV SOLN
500.0000 [IU] | Freq: Once | INTRAVENOUS | Status: AC | PRN
  Administered 2015-01-12: 500 [IU]
  Filled 2015-01-12: qty 5

## 2015-01-12 MED ORDER — SODIUM CHLORIDE 0.9 % IV SOLN
1000.0000 mg/m2 | Freq: Once | INTRAVENOUS | Status: AC
  Administered 2015-01-12: 1558 mg via INTRAVENOUS
  Filled 2015-01-12: qty 40.98

## 2015-01-12 NOTE — Patient Instructions (Signed)

## 2015-01-12 NOTE — Progress Notes (Signed)
Liver enzymes elevated at 191 today; Dr. Burr Medico and pharmacy aware. No dosage adjustments required, Okay to treat. Pt to come back in one week for lab recheck. Informed to call clinic with any changes or concerning symptoms in the meantime. Pt and family notified and verbalize understanding.

## 2015-01-12 NOTE — Patient Instructions (Signed)
[facility] Discharge Instructions for Patients Receiving Chemotherapy  Today you received the following chemotherapy agents cisplatin and gemzar.   To help prevent nausea and vomiting after your treatment, we encourage you to take your nausea medication as prescribed.    If you develop nausea and vomiting that is not controlled by your nausea medication, call the clinic.   BELOW ARE SYMPTOMS THAT SHOULD BE REPORTED IMMEDIATELY:  *FEVER GREATER THAN 100.5 F  *CHILLS WITH OR WITHOUT FEVER  NAUSEA AND VOMITING THAT IS NOT CONTROLLED WITH YOUR NAUSEA MEDICATION  *UNUSUAL SHORTNESS OF BREATH  *UNUSUAL BRUISING OR BLEEDING  TENDERNESS IN MOUTH AND THROAT WITH OR WITHOUT PRESENCE OF ULCERS  *URINARY PROBLEMS  *BOWEL PROBLEMS  UNUSUAL RASH Items with * indicate a potential emergency and should be followed up as soon as possible.  Feel free to call the clinic you have any questions or concerns. The clinic phone number is [phone].  Please show the [place] at check-in to the Emergency Department and triage nurse.

## 2015-01-19 ENCOUNTER — Ambulatory Visit (HOSPITAL_BASED_OUTPATIENT_CLINIC_OR_DEPARTMENT_OTHER): Payer: Medicare HMO

## 2015-01-19 ENCOUNTER — Other Ambulatory Visit (HOSPITAL_BASED_OUTPATIENT_CLINIC_OR_DEPARTMENT_OTHER): Payer: Medicare HMO

## 2015-01-19 DIAGNOSIS — Z452 Encounter for adjustment and management of vascular access device: Secondary | ICD-10-CM | POA: Diagnosis not present

## 2015-01-19 DIAGNOSIS — D630 Anemia in neoplastic disease: Secondary | ICD-10-CM | POA: Diagnosis not present

## 2015-01-19 DIAGNOSIS — D6481 Anemia due to antineoplastic chemotherapy: Secondary | ICD-10-CM | POA: Diagnosis not present

## 2015-01-19 DIAGNOSIS — C801 Malignant (primary) neoplasm, unspecified: Secondary | ICD-10-CM | POA: Diagnosis not present

## 2015-01-19 DIAGNOSIS — C787 Secondary malignant neoplasm of liver and intrahepatic bile duct: Secondary | ICD-10-CM | POA: Diagnosis not present

## 2015-01-19 DIAGNOSIS — Z95828 Presence of other vascular implants and grafts: Secondary | ICD-10-CM

## 2015-01-19 LAB — CBC WITH DIFFERENTIAL/PLATELET
BASO%: 0 % (ref 0.0–2.0)
BASOS ABS: 0 10*3/uL (ref 0.0–0.1)
EOS ABS: 0 10*3/uL (ref 0.0–0.5)
EOS%: 0 % (ref 0.0–7.0)
HEMATOCRIT: 21.6 % — AB (ref 34.8–46.6)
HEMOGLOBIN: 7.2 g/dL — AB (ref 11.6–15.9)
LYMPH#: 1.7 10*3/uL (ref 0.9–3.3)
LYMPH%: 58.2 % — ABNORMAL HIGH (ref 14.0–49.7)
MCH: 34.6 pg — ABNORMAL HIGH (ref 25.1–34.0)
MCHC: 33.3 g/dL (ref 31.5–36.0)
MCV: 103.8 fL — ABNORMAL HIGH (ref 79.5–101.0)
MONO#: 0.2 10*3/uL (ref 0.1–0.9)
MONO%: 6.7 % (ref 0.0–14.0)
NEUT%: 35.1 % — ABNORMAL LOW (ref 38.4–76.8)
NEUTROS ABS: 1 10*3/uL — AB (ref 1.5–6.5)
Platelets: 43 10*3/uL — ABNORMAL LOW (ref 145–400)
RBC: 2.08 10*6/uL — ABNORMAL LOW (ref 3.70–5.45)
RDW: 16.7 % — AB (ref 11.2–14.5)
WBC: 2.9 10*3/uL — ABNORMAL LOW (ref 3.9–10.3)
nRBC: 0 % (ref 0–0)

## 2015-01-19 LAB — COMPREHENSIVE METABOLIC PANEL (CC13)
ALK PHOS: 87 U/L (ref 40–150)
ALT: 55 U/L (ref 0–55)
AST: 24 U/L (ref 5–34)
Albumin: 3.6 g/dL (ref 3.5–5.0)
Anion Gap: 12 mEq/L — ABNORMAL HIGH (ref 3–11)
BILIRUBIN TOTAL: 0.36 mg/dL (ref 0.20–1.20)
BUN: 18.3 mg/dL (ref 7.0–26.0)
CO2: 22 mEq/L (ref 22–29)
CREATININE: 0.8 mg/dL (ref 0.6–1.1)
Calcium: 9 mg/dL (ref 8.4–10.4)
Chloride: 106 mEq/L (ref 98–109)
Glucose: 98 mg/dl (ref 70–140)
Potassium: 3.9 mEq/L (ref 3.5–5.1)
Sodium: 140 mEq/L (ref 136–145)
TOTAL PROTEIN: 6.3 g/dL — AB (ref 6.4–8.3)

## 2015-01-19 MED ORDER — SODIUM CHLORIDE 0.9 % IJ SOLN
10.0000 mL | INTRAMUSCULAR | Status: DC | PRN
  Administered 2015-01-19: 10 mL via INTRAVENOUS
  Filled 2015-01-19: qty 10

## 2015-01-19 MED ORDER — HEPARIN SOD (PORK) LOCK FLUSH 100 UNIT/ML IV SOLN
500.0000 [IU] | Freq: Once | INTRAVENOUS | Status: AC
  Administered 2015-01-19: 500 [IU] via INTRAVENOUS
  Filled 2015-01-19: qty 5

## 2015-01-19 NOTE — Patient Instructions (Signed)

## 2015-01-20 ENCOUNTER — Other Ambulatory Visit: Payer: Self-pay | Admitting: Hematology

## 2015-01-22 ENCOUNTER — Ambulatory Visit (HOSPITAL_COMMUNITY)
Admission: RE | Admit: 2015-01-22 | Discharge: 2015-01-22 | Disposition: A | Discharge status: Home / Self Care | Payer: Medicare HMO | Source: Ambulatory Visit | Attending: Hematology | Admitting: Hematology

## 2015-01-22 ENCOUNTER — Other Ambulatory Visit: Payer: Self-pay | Admitting: *Deleted

## 2015-01-22 ENCOUNTER — Telehealth: Payer: Self-pay | Admitting: *Deleted

## 2015-01-22 DIAGNOSIS — D630 Anemia in neoplastic disease: Secondary | ICD-10-CM

## 2015-01-22 DIAGNOSIS — C801 Malignant (primary) neoplasm, unspecified: Secondary | ICD-10-CM | POA: Insufficient documentation

## 2015-01-22 DIAGNOSIS — C787 Secondary malignant neoplasm of liver and intrahepatic bile duct: Secondary | ICD-10-CM

## 2015-01-22 NOTE — Telephone Encounter (Signed)
Spoke with pt today and was informed that pt was asymptomatic.  Pt had labs on 01/19/15 with Hgb 7.2 ;  Pt denied shortness of breath, denied heart " beating fast ", denied increased fatigue.  Pt stated she was feeling fine.  Explanations given to pt that Dr. Burr Medico would like to have pt repeat labs and office visit  On Wed  01/24/15 ;  If lab results are good and WNL,  Pt can have chemo on Fri. 01/26/15 as planned. Pt agreed and wanted nurse to talk to her daughter [name] for the appts - since husband is in the hospital now.   Spoke with [name], and gave her above information.   POF sent to scheduler.  [facility] called today 01/22/15.

## 2015-01-23 ENCOUNTER — Other Ambulatory Visit: Payer: Self-pay | Admitting: *Deleted

## 2015-01-23 ENCOUNTER — Telehealth: Payer: Self-pay | Admitting: *Deleted

## 2015-01-23 ENCOUNTER — Telehealth: Payer: Self-pay | Admitting: Hematology

## 2015-01-23 NOTE — Telephone Encounter (Signed)
Left message to confirm appointment for 05/11 °

## 2015-01-23 NOTE — Telephone Encounter (Signed)
Per staff message and POF I have scheduled appts. Advised scheduler of appts. JMW  

## 2015-01-24 ENCOUNTER — Ambulatory Visit (HOSPITAL_BASED_OUTPATIENT_CLINIC_OR_DEPARTMENT_OTHER): Payer: Medicare HMO | Admitting: Hematology

## 2015-01-24 ENCOUNTER — Encounter: Payer: Self-pay | Admitting: Hematology

## 2015-01-24 ENCOUNTER — Ambulatory Visit (HOSPITAL_BASED_OUTPATIENT_CLINIC_OR_DEPARTMENT_OTHER): Payer: Medicare HMO

## 2015-01-24 ENCOUNTER — Other Ambulatory Visit (HOSPITAL_BASED_OUTPATIENT_CLINIC_OR_DEPARTMENT_OTHER): Payer: Medicare HMO

## 2015-01-24 ENCOUNTER — Telehealth: Payer: Self-pay | Admitting: Hematology

## 2015-01-24 VITALS — BP 151/113 | HR 72 | Temp 97.7°F | Resp 18 | Ht 66.0 in | Wt 118.8 lb

## 2015-01-24 VITALS — BP 169/94 | HR 62 | Temp 98.2°F | Resp 18

## 2015-01-24 DIAGNOSIS — R180 Malignant ascites: Secondary | ICD-10-CM | POA: Diagnosis not present

## 2015-01-24 DIAGNOSIS — C787 Secondary malignant neoplasm of liver and intrahepatic bile duct: Secondary | ICD-10-CM | POA: Diagnosis not present

## 2015-01-24 DIAGNOSIS — D630 Anemia in neoplastic disease: Secondary | ICD-10-CM

## 2015-01-24 DIAGNOSIS — D6481 Anemia due to antineoplastic chemotherapy: Secondary | ICD-10-CM | POA: Diagnosis not present

## 2015-01-24 DIAGNOSIS — Z452 Encounter for adjustment and management of vascular access device: Secondary | ICD-10-CM

## 2015-01-24 DIAGNOSIS — C801 Malignant (primary) neoplasm, unspecified: Principal | ICD-10-CM

## 2015-01-24 DIAGNOSIS — Z95828 Presence of other vascular implants and grafts: Secondary | ICD-10-CM

## 2015-01-24 LAB — COMPREHENSIVE METABOLIC PANEL (CC13)
ALBUMIN: 3.5 g/dL (ref 3.5–5.0)
ALT: 24 U/L (ref 0–55)
AST: 19 U/L (ref 5–34)
Alkaline Phosphatase: 77 U/L (ref 40–150)
Anion Gap: 10 mEq/L (ref 3–11)
BUN: 11.5 mg/dL (ref 7.0–26.0)
CALCIUM: 9.1 mg/dL (ref 8.4–10.4)
CHLORIDE: 107 meq/L (ref 98–109)
CO2: 25 mEq/L (ref 22–29)
Creatinine: 0.7 mg/dL (ref 0.6–1.1)
Glucose: 87 mg/dl (ref 70–140)
Potassium: 4.2 mEq/L (ref 3.5–5.1)
Sodium: 143 mEq/L (ref 136–145)
Total Bilirubin: 0.36 mg/dL (ref 0.20–1.20)
Total Protein: 6.2 g/dL — ABNORMAL LOW (ref 6.4–8.3)

## 2015-01-24 LAB — CBC WITH DIFFERENTIAL/PLATELET
BASO%: 0.3 % (ref 0.0–2.0)
BASOS ABS: 0 10*3/uL (ref 0.0–0.1)
EOS%: 0.3 % (ref 0.0–7.0)
Eosinophils Absolute: 0 10*3/uL (ref 0.0–0.5)
HCT: 25.9 % — ABNORMAL LOW (ref 34.8–46.6)
HGB: 8.3 g/dL — ABNORMAL LOW (ref 11.6–15.9)
LYMPH#: 1.5 10*3/uL (ref 0.9–3.3)
LYMPH%: 39 % (ref 14.0–49.7)
MCH: 35 pg — AB (ref 25.1–34.0)
MCHC: 32 g/dL (ref 31.5–36.0)
MCV: 109.3 fL — ABNORMAL HIGH (ref 79.5–101.0)
MONO#: 0.5 10*3/uL (ref 0.1–0.9)
MONO%: 14.2 % — ABNORMAL HIGH (ref 0.0–14.0)
NEUT#: 1.7 10*3/uL (ref 1.5–6.5)
NEUT%: 46.2 % (ref 38.4–76.8)
Platelets: 73 10*3/uL — ABNORMAL LOW (ref 145–400)
RBC: 2.37 10*6/uL — AB (ref 3.70–5.45)
RDW: 18.9 % — AB (ref 11.2–14.5)
WBC: 3.7 10*3/uL — AB (ref 3.9–10.3)

## 2015-01-24 LAB — HOLD TUBE, BLOOD BANK

## 2015-01-24 LAB — PREPARE RBC (CROSSMATCH)

## 2015-01-24 MED ORDER — ACETAMINOPHEN 325 MG PO TABS
ORAL_TABLET | ORAL | Status: AC
  Filled 2015-01-24: qty 2

## 2015-01-24 MED ORDER — ACETAMINOPHEN 325 MG PO TABS
650.0000 mg | ORAL_TABLET | Freq: Once | ORAL | Status: AC
  Administered 2015-01-24: 650 mg via ORAL

## 2015-01-24 MED ORDER — DIPHENHYDRAMINE HCL 25 MG PO CAPS
25.0000 mg | ORAL_CAPSULE | Freq: Once | ORAL | Status: AC
  Administered 2015-01-24: 25 mg via ORAL

## 2015-01-24 MED ORDER — SODIUM CHLORIDE 0.9 % IV SOLN
250.0000 mL | Freq: Once | INTRAVENOUS | Status: AC
  Administered 2015-01-24: 250 mL via INTRAVENOUS

## 2015-01-24 MED ORDER — SODIUM CHLORIDE 0.9 % IJ SOLN
10.0000 mL | INTRAMUSCULAR | Status: DC | PRN
  Filled 2015-01-24: qty 10

## 2015-01-24 MED ORDER — DIPHENHYDRAMINE HCL 25 MG PO CAPS
ORAL_CAPSULE | ORAL | Status: AC
  Filled 2015-01-24: qty 1

## 2015-01-24 MED ORDER — SODIUM CHLORIDE 0.9 % IJ SOLN
10.0000 mL | INTRAMUSCULAR | Status: DC | PRN
  Administered 2015-01-24: 10 mL via INTRAVENOUS
  Filled 2015-01-24: qty 10

## 2015-01-24 MED ORDER — HEPARIN SOD (PORK) LOCK FLUSH 100 UNIT/ML IV SOLN
500.0000 [IU] | Freq: Every day | INTRAVENOUS | Status: DC | PRN
  Filled 2015-01-24: qty 5

## 2015-01-24 NOTE — Progress Notes (Signed)
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 Hepatosplenomegaly. Abdominal and pelvic ascites and Pathologically enlarged upper abdominal. CT chest (-).    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 Bipolar 2. Hypothyroidism   CURRENT THERAPY: Cisplatin 25 mg/m, gemcitabine 1000 mg/m, on day 1 and 8, every 21 days started on 10/10/2014  INTERVAL HISTORY: [name] returns for follow-up and blood transfusion. Her CBC from last Friday showed hemoglobin 7.2, platelet 43, and I schedule her to come in today for blood transfusion. She actually feels well overall. Mild fatigue stable, no chest pain, dyspnea, or any other symptoms. She has good at appetite and functions well at home. No bleeding or fever.  REVIEW OF SYSTEMS:   Constitutional: Denies fevers, chills, (+)  fatigued  Eyes: Denies blurriness of vision Ears, nose, mouth, throat, and face: Denies mucositis or sore throat Respiratory: Denies cough, dyspnea or wheezes Cardiovascular: Denies palpitation, chest discomfort or lower extremity swelling Gastrointestinal:  no nausea, no heartburn or change in bowel habits Skin: Denies abnormal skin  rashes.  Lymphatics: Denies new lymphadenopathy or easy bruising Neurological:Denies numbness, tingling or new weaknesses Behavioral/Psych: Mood is stable, no new changes  All other systems were reviewed with the patient and are negative.  I have reviewed the past medical history, past surgical history, social history and family history with the patient and they are unchanged from previous note.  ALLERGIES:  is allergic to buspar; cymbalta; hydroxyzine; and sertraline.  MEDICATIONS:  Current Outpatient Prescriptions  Medication Sig Dispense Refill  . benztropine (COGENTIN) 1 MG tablet Take 1 tablet (1 mg total) by mouth at bedtime. 90 tablet 2  . buPROPion (WELLBUTRIN XL) 300 MG 24 hr tablet Take 1 tablet (300 mg total) by mouth daily. 90 tablet 2  . lactulose (CHRONULAC) 10 GM/15ML solution 15 ml q 4 hr as needed for constipation.  Max of 60 ml per day (Patient taking differently: Take 10 g by mouth daily as needed. 30 ml q 4 hr as needed for constipation.  Max of 60 ml per day) 500 mL 2  . levothyroxine (SYNTHROID, LEVOTHROID) 88 MCG tablet Take 88 mcg by mouth at bedtime.     . lidocaine-prilocaine (EMLA) cream Apply 1 application topically as needed. Apply to port 1 hr before treatment as instructed. 30 g 0  . morphine (MSIR) 15 MG tablet Take 15 mg by mouth every 12 (twelve) hours as needed for moderate pain or severe pain.    Marland Kitchen ondansetron (ZOFRAN) 4 MG tablet Take 1 tablet (4 mg total) by mouth 4 (four) times daily as needed for nausea or vomiting. 60 tablet 0  . oxyCODONE (OXY IR/ROXICODONE) 5 MG immediate release tablet Take 1 tablet (5 mg total) by mouth every 4 (four) hours as needed for  severe pain. 120 tablet 0  . prochlorperazine (COMPAZINE) 10 MG tablet Take 1 tablet (10 mg total) by mouth every 6 (six) hours as needed for nausea or vomiting. 30 tablet 2  . propranolol (INDERAL) 10 MG tablet Take 1 tablet (10 mg total) by mouth 3 (three) times daily. 270 tablet 2  . risperiDONE  (RISPERDAL) 3 MG tablet Take 1 tablet (3 mg total) by mouth at bedtime. 90 tablet 2  . zolpidem (AMBIEN) 10 MG tablet Take 1 tablet (10 mg total) by mouth at bedtime as needed for sleep. 90 tablet 1   No current facility-administered medications for this visit.   Facility-Administered Medications Ordered in Other Visits  Medication Dose Route Frequency Provider Last Rate Last Dose  . Tbo-Filgrastim (GRANIX) injection 300 mcg  300 mcg Subcutaneous Once [name], MD        PHYSICAL EXAMINATION: ECOG PERFORMANCE STATUS: 2   Filed Vitals:   01/24/15 1021  BP: 151/113  Pulse: 72  Temp:   Resp:    Filed Weights   01/24/15 1011  Weight: 118 lb 12.8 oz (53.887 kg)    GENERAL:alert, no distress and comfortable SKIN: skin color, texture, turgor are normal, no rashes or significant lesions EYES: normal, Conjunctiva are pink and non-injected, sclera clear OROPHARYNX:no exudate, no erythema and lips, buccal mucosa, and tongue normal  NECK: supple, thyroid normal size, non-tender, without nodularity LYMPH:  no palpable lymphadenopathy in the cervical, axillary or inguinal LUNGS: clear to auscultation and percussion with normal breathing effort HEART: regular rate & rhythm and no murmurs and no lower extremity edema ABDOMEN:abdomen soft,  and normal bowel sounds. Non-distended, (+) hepatomegaly has improved, about 6 cm below rib cage now  Musculoskeletal:no cyanosis of digits and no clubbing  NEURO: alert & oriented x 3 with fluent speech, no focal motor/sensory deficits  LABORATORY DATA:  I have reviewed the data as listed  CBC Latest Ref Rng 01/24/2015 01/19/2015 01/12/2015  WBC 3.9 - 10.3 10e3/uL 3.7(L) 2.9(L) 4.6  Hemoglobin 11.6 - 15.9 g/dL 8.3(L) 7.2(L) 8.6(L)  Hematocrit 34.8 - 46.6 % 25.9(L) 21.6(L) 26.1(L)  Platelets 145 - 400 10e3/uL 73(L) 43(L) 178    CMP Latest Ref Rng 01/19/2015 01/12/2015 01/05/2015  Glucose 70 - 140 mg/dl 98 92 91  BUN 7.0 - 26.0 mg/dL 18.3 17.8 11.9    Creatinine 0.6 - 1.1 mg/dL 0.8 0.8 0.7  Sodium 136 - 145 mEq/L 140 139 141  Potassium 3.5 - 5.1 mEq/L 3.9 4.2 4.2  Chloride 96 - 112 mEq/L - - -  CO2 22 - 29 mEq/L 22 25 22   Calcium 8.4 - 10.4 mg/dL 9.0 9.0 8.9  Total Protein 6.4 - 8.3 g/dL 6.3(L) 6.6 6.4  Total Bilirubin 0.20 - 1.20 mg/dL 0.36 0.38 0.43  Alkaline Phos 40 - 150 U/L 87 109 78  AST 5 - 34 U/L 24 191(HH) 19  ALT 0 - 55 U/L 55 191(H) 17   CA 19.9  Status: Finalresult Visible to patient:  MyChart Nextappt: Today at 10:15 AM in Oncology ([facility]) Dx:  Metastatic adenocarcinoma              Ref Range 2wk ago (12/22/14) 90mo ago (12/01/14) 60mo ago (11/24/14)    CA 19-9 <35.0 U/mL 7631.0 (H) 15297.2 (H)CM 16993.2 (H)CM           Surgical path 08/03/14 - ADENOCARCINOMA. Microscopic Comment The core biopsies are extensively involved by adenocarcinoma with focal goblet cells and foci with signet ring morphology. Immunohistochemistry  will be performed and reported as an addendum. (JDP:gt, 08/04/14) Addendum: Immunohistochemistry is performed and the tumor is positive with Cytokeratin 7 and negative with Cytokeratin 20, CDX2, thyroid transcription factor-1, Napsin A, estrogen receptor, progesterone receptor and gross cystic disease fluid protein. The immunophenotype is nonspecific.  RADIOGRAPHIC STUDIES: I have personally reviewed the radiological images as listed and agreed with the findings in the report.  CT CHEST, ABDOMEN AND PELVIS IMPRESSION 01/03/2015 IMPRESSION: 1. Stable appearance of the small left supraclavicular lymph node. 2. Interval decrease in size of the hepatic metastases. No new or progressive liver lesions are evident. 3. Necrotic portal caval lymph node shows no substantial interval change. 4. Persistent abnormal soft tissue attenuation encasing the celiac axis and SMA.   I have reviewed her EGD and colonoscopy reports which was done on 11/20, which was negative for  ulceration or mass.   ASSESSMENT & PLAN:  54 yo female with PMH of bipolar but otherwise healthy, no history of liver disease or alcohol abuse, no history of blood transfusion, who presented with significant N/V, abdominal pain and bloating, New discovered multiple liver masses and small ascites, and elevated serum beta HCG, CA125 and CEA.   1. 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 -The patient and her family understand the overall poor prognosis, and her treatment goal is palliative - Her case was reviewed in our GI tumor Board. The radiologist feels her liver lesions are most consistent with metastatic lesions, not typical for cholangiocarcinoma, but pathology morphology favors cholangiocarcinoma or pancreatic cancer. CA19.9 57,711. 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  -I reviewed her restaging CT on 01/03/2015 which showed continue responding to treatment, no new lesions or ascites.. She is clinically doing much better too.  -Tumor marker CA 19.9 significantly trending down, consistent with good response to chemotherapy -will continue chemo, planning for up to 8 cycles -Giving the significant cytopenia, I will postpone her next cycle chemotherapy to next week, and likely gemcitabine dose reduction by 10-15%.  2. Nausea and vomiting -improved lately  -Likely related to chemotherapy. -I suggest her to use Zofran and Compazine alternatively, 20-30 minutes before each meal. -She knows to take nutrition supplement, high-protein high calorie diet, and drink adequate fluids.  3. Anemia, related to chemotherapy and underline malignancy  -Her hemoglobin is 8.3 today, her anemia is likely will get worse with more chemotherapy -1 unit of RBC transfusion today  3. Bilateral leg edema -I checked Doppler ultrasound on her legs which showed no evidence of DVT. -this is likely  related to her hypoalbuminemia and her cancer -This is resolved now, with good tumor response to chemo   4. malignant ascites -She had paracentesis once --near resolved now, as part of tumor response to chemo    5. Malnutrition and deconditioning secondary to her probably underline malignancy  -I encouraged her to take nutrition supplement, and continue home PT.  -much Improved  6. Bipolar -stable mood, continue meds  7. Abdominal pain -Improved and controlled. She still has very high co-pay for OxyContin and she could not afford it. Her pain seemed very well controlled by oxycodone, I instructed her to take only as needed, she takes one tablet every other day.  Plan -1 units RBC per transfusion today -Postpone her cycle 6 chemotherapy to next Friday, May 20 -I'll see her back on May 27   All questions were answered. The patient knows to call the clinic  with any problems, questions or concerns. No barriers to learning was detected.  I spent 20 minutes counseling the patient face to face. The total time spent in the appointment was 25 minutes and more than 50% was on counseling and review of test results     [name], MD 01/24/2015

## 2015-01-24 NOTE — Patient Instructions (Signed)

## 2015-01-24 NOTE — Telephone Encounter (Signed)
Gave adn rpinted appt sched and avs fo rpt for May and June

## 2015-01-24 NOTE — Patient Instructions (Signed)

## 2015-01-24 NOTE — [facility] Oncology Navigator Note (Signed)
Met with patient and daughter during her follow up visit. Explained role of nurse navigator and gave her contact information. She reports no difficulty with her ADL's or transportation or care needs. She will follow up with her PCP regarding the hypertension.

## 2015-01-25 LAB — TYPE AND SCREEN
ABO/RH(D): O POS
Antibody Screen: NEGATIVE
Unit division: 0

## 2015-01-26 ENCOUNTER — Ambulatory Visit: Payer: Self-pay

## 2015-01-26 ENCOUNTER — Other Ambulatory Visit: Payer: Self-pay

## 2015-01-26 ENCOUNTER — Encounter: Payer: Self-pay | Admitting: Hematology

## 2015-01-29 ENCOUNTER — Other Ambulatory Visit: Payer: Self-pay | Admitting: Hematology

## 2015-01-30 NOTE — Progress Notes (Signed)
This encounter was created in error - please disregard.

## 2015-02-02 ENCOUNTER — Other Ambulatory Visit (HOSPITAL_BASED_OUTPATIENT_CLINIC_OR_DEPARTMENT_OTHER): Payer: Medicare HMO

## 2015-02-02 ENCOUNTER — Other Ambulatory Visit: Payer: Self-pay | Admitting: Hematology

## 2015-02-02 ENCOUNTER — Ambulatory Visit: Payer: Medicare HMO

## 2015-02-02 ENCOUNTER — Ambulatory Visit (HOSPITAL_BASED_OUTPATIENT_CLINIC_OR_DEPARTMENT_OTHER): Payer: Medicare HMO

## 2015-02-02 VITALS — BP 178/90 | HR 65 | Temp 97.6°F | Resp 18

## 2015-02-02 DIAGNOSIS — Z5111 Encounter for antineoplastic chemotherapy: Secondary | ICD-10-CM | POA: Diagnosis not present

## 2015-02-02 DIAGNOSIS — D6481 Anemia due to antineoplastic chemotherapy: Secondary | ICD-10-CM | POA: Diagnosis not present

## 2015-02-02 DIAGNOSIS — D630 Anemia in neoplastic disease: Secondary | ICD-10-CM | POA: Diagnosis not present

## 2015-02-02 DIAGNOSIS — C787 Secondary malignant neoplasm of liver and intrahepatic bile duct: Secondary | ICD-10-CM

## 2015-02-02 DIAGNOSIS — C801 Malignant (primary) neoplasm, unspecified: Secondary | ICD-10-CM

## 2015-02-02 DIAGNOSIS — Z95828 Presence of other vascular implants and grafts: Secondary | ICD-10-CM

## 2015-02-02 DIAGNOSIS — C799 Secondary malignant neoplasm of unspecified site: Secondary | ICD-10-CM

## 2015-02-02 LAB — COMPREHENSIVE METABOLIC PANEL (CC13)
ALT: 15 U/L (ref 0–55)
ANION GAP: 10 meq/L (ref 3–11)
AST: 20 U/L (ref 5–34)
Albumin: 3.6 g/dL (ref 3.5–5.0)
Alkaline Phosphatase: 77 U/L (ref 40–150)
BUN: 16.5 mg/dL (ref 7.0–26.0)
CHLORIDE: 107 meq/L (ref 98–109)
CO2: 25 meq/L (ref 22–29)
Calcium: 9.1 mg/dL (ref 8.4–10.4)
Creatinine: 0.8 mg/dL (ref 0.6–1.1)
EGFR: >90 mL/min/{1.73_m2} (ref >90)
Glucose: 93 mg/dl (ref 70–140)
Potassium: 4.3 mEq/L (ref 3.5–5.1)
Sodium: 142 mEq/L (ref 136–145)
Total Bilirubin: 0.4 mg/dL (ref 0.20–1.20)
Total Protein: 6.5 g/dL (ref 6.4–8.3)

## 2015-02-02 LAB — CBC WITH DIFFERENTIAL/PLATELET
BASO%: 0.8 % (ref 0.0–2.0)
BASOS ABS: 0 10*3/uL (ref 0.0–0.1)
EOS%: 0.8 % (ref 0.0–7.0)
Eosinophils Absolute: 0 10*3/uL (ref 0.0–0.5)
HCT: 32.1 % — ABNORMAL LOW (ref 34.8–46.6)
HEMOGLOBIN: 10.8 g/dL — AB (ref 11.6–15.9)
LYMPH#: 1.4 10*3/uL (ref 0.9–3.3)
LYMPH%: 31.7 % (ref 14.0–49.7)
MCH: 35 pg — AB (ref 25.1–34.0)
MCHC: 33.7 g/dL (ref 31.5–36.0)
MCV: 104 fL — AB (ref 79.5–101.0)
MONO#: 0.7 10*3/uL (ref 0.1–0.9)
MONO%: 15.3 % — ABNORMAL HIGH (ref 0.0–14.0)
NEUT%: 51.4 % (ref 38.4–76.8)
NEUTROS ABS: 2.3 10*3/uL (ref 1.5–6.5)
Platelets: 188 10*3/uL (ref 145–400)
RBC: 3.09 10*6/uL — ABNORMAL LOW (ref 3.70–5.45)
RDW: 16.2 % — AB (ref 11.2–14.5)
WBC: 4.4 10*3/uL (ref 3.9–10.3)

## 2015-02-02 LAB — MAGNESIUM (CC13): Magnesium: 2 mg/dl (ref 1.5–2.5)

## 2015-02-02 LAB — CANCER ANTIGEN 19-9: CA 19 9: 2683.3 U/mL — AB (ref <35.0)

## 2015-02-02 MED ORDER — SODIUM CHLORIDE 0.9 % IV SOLN
Freq: Once | INTRAVENOUS | Status: AC
  Administered 2015-02-02: 11:00:00 via INTRAVENOUS

## 2015-02-02 MED ORDER — PALONOSETRON HCL INJECTION 0.25 MG/5ML
INTRAVENOUS | Status: AC
  Filled 2015-02-02: qty 5

## 2015-02-02 MED ORDER — HEPARIN SOD (PORK) LOCK FLUSH 100 UNIT/ML IV SOLN
500.0000 [IU] | Freq: Once | INTRAVENOUS | Status: AC | PRN
  Administered 2015-02-02: 500 [IU]
  Filled 2015-02-02: qty 5

## 2015-02-02 MED ORDER — SODIUM CHLORIDE 0.9 % IJ SOLN
10.0000 mL | INTRAMUSCULAR | Status: DC | PRN
  Administered 2015-02-02: 10 mL
  Filled 2015-02-02: qty 10

## 2015-02-02 MED ORDER — CISPLATIN CHEMO INJECTION 100MG/100ML
25.0000 mg/m2 | Freq: Once | INTRAVENOUS | Status: AC
  Administered 2015-02-02: 39 mg via INTRAVENOUS
  Filled 2015-02-02: qty 39

## 2015-02-02 MED ORDER — SODIUM CHLORIDE 0.9 % IV SOLN: 1000.0000 mg/m2 | Freq: Once | INTRAVENOUS | Status: DC

## 2015-02-02 MED ORDER — PALONOSETRON HCL INJECTION 0.25 MG/5ML
0.2500 mg | Freq: Once | INTRAVENOUS | Status: AC
  Administered 2015-02-02: 0.25 mg via INTRAVENOUS

## 2015-02-02 MED ORDER — SODIUM CHLORIDE 0.9 % IV SOLN
Freq: Once | INTRAVENOUS | Status: AC
  Administered 2015-02-02: 13:00:00 via INTRAVENOUS
  Filled 2015-02-02: qty 5

## 2015-02-02 MED ORDER — SODIUM CHLORIDE 0.9 % IJ SOLN
10.0000 mL | INTRAMUSCULAR | Status: DC | PRN
  Administered 2015-02-02: 10 mL via INTRAVENOUS
  Filled 2015-02-02: qty 10

## 2015-02-02 MED ORDER — GEMCITABINE HCL CHEMO INJECTION 1 GM/26.3ML
850.0000 mg/m2 | Freq: Once | INTRAVENOUS | Status: AC
  Administered 2015-02-02: 1330 mg via INTRAVENOUS
  Filled 2015-02-02: qty 34.98

## 2015-02-02 MED ORDER — POTASSIUM CHLORIDE 2 MEQ/ML IV SOLN
Freq: Once | INTRAVENOUS | Status: AC
  Administered 2015-02-02: 11:00:00 via INTRAVENOUS
  Filled 2015-02-02: qty 10

## 2015-02-02 NOTE — Progress Notes (Signed)
1026 Caregiver reports pt saw PCP on 01/30/15 about B/P where it was within normal limits. Caregiver also reports that PCP feels it is situational and ordered Norvasc daily as needed. Pt/CG did not bring Norvasc to treatment. Repeated B/P 161/101, notified Dr.Feng and per her request a manual was done 160/102.  1032 Notified Dr.Feng of manual B/P. Per Dr.Feng, proceed with treatment. Pt to take her home medication, Inderal, at noon. Will instruct pt to follow up with PCP 1640 B/P 178/90, CG states she will give pt her PRN blood pressure medication when they arrive home. Instructed to follow up with PCP per Dr.Feng. Verbalized understanding.

## 2015-02-02 NOTE — Patient Instructions (Signed)

## 2015-02-02 NOTE — Patient Instructions (Signed)
[facility] Discharge Instructions for Patients Receiving Chemotherapy  Today you received the following chemotherapy agents Carboplatin and Gemzar.  To help prevent nausea and vomiting after your treatment, we encourage you to take your nausea medication as prescribed.   If you develop nausea and vomiting that is not controlled by your nausea medication, call the clinic.   BELOW ARE SYMPTOMS THAT SHOULD BE REPORTED IMMEDIATELY:  *FEVER GREATER THAN 100.5 F  *CHILLS WITH OR WITHOUT FEVER  NAUSEA AND VOMITING THAT IS NOT CONTROLLED WITH YOUR NAUSEA MEDICATION  *UNUSUAL SHORTNESS OF BREATH  *UNUSUAL BRUISING OR BLEEDING  TENDERNESS IN MOUTH AND THROAT WITH OR WITHOUT PRESENCE OF ULCERS  *URINARY PROBLEMS  *BOWEL PROBLEMS  UNUSUAL RASH Items with * indicate a potential emergency and should be followed up as soon as possible.  Feel free to call the clinic you have any questions or concerns. The clinic phone number is [phone].  Please show the CHEMO ALERT CARD at check-in to the Emergency Department and triage nurse.   

## 2015-02-08 ENCOUNTER — Ambulatory Visit (HOSPITAL_BASED_OUTPATIENT_CLINIC_OR_DEPARTMENT_OTHER): Payer: Medicare HMO

## 2015-02-08 ENCOUNTER — Telehealth: Payer: Self-pay | Admitting: Hematology

## 2015-02-08 ENCOUNTER — Telehealth: Payer: Self-pay | Admitting: *Deleted

## 2015-02-08 ENCOUNTER — Other Ambulatory Visit (HOSPITAL_BASED_OUTPATIENT_CLINIC_OR_DEPARTMENT_OTHER): Payer: Medicare HMO

## 2015-02-08 ENCOUNTER — Ambulatory Visit (HOSPITAL_BASED_OUTPATIENT_CLINIC_OR_DEPARTMENT_OTHER): Payer: Medicare HMO | Admitting: Hematology

## 2015-02-08 ENCOUNTER — Other Ambulatory Visit: Payer: Self-pay | Admitting: *Deleted

## 2015-02-08 ENCOUNTER — Encounter: Payer: Self-pay | Admitting: *Deleted

## 2015-02-08 ENCOUNTER — Encounter: Payer: Self-pay | Admitting: Hematology

## 2015-02-08 VITALS — BP 155/90 | HR 66 | Temp 97.8°F | Resp 18 | Ht 66.0 in | Wt 120.7 lb

## 2015-02-08 DIAGNOSIS — R112 Nausea with vomiting, unspecified: Secondary | ICD-10-CM | POA: Diagnosis not present

## 2015-02-08 DIAGNOSIS — C787 Secondary malignant neoplasm of liver and intrahepatic bile duct: Secondary | ICD-10-CM | POA: Diagnosis not present

## 2015-02-08 DIAGNOSIS — D6481 Anemia due to antineoplastic chemotherapy: Secondary | ICD-10-CM | POA: Diagnosis not present

## 2015-02-08 DIAGNOSIS — Z95828 Presence of other vascular implants and grafts: Secondary | ICD-10-CM

## 2015-02-08 DIAGNOSIS — C801 Malignant (primary) neoplasm, unspecified: Secondary | ICD-10-CM | POA: Diagnosis not present

## 2015-02-08 DIAGNOSIS — Z452 Encounter for adjustment and management of vascular access device: Secondary | ICD-10-CM | POA: Diagnosis not present

## 2015-02-08 DIAGNOSIS — D630 Anemia in neoplastic disease: Secondary | ICD-10-CM

## 2015-02-08 DIAGNOSIS — R180 Malignant ascites: Secondary | ICD-10-CM

## 2015-02-08 LAB — CBC WITH DIFFERENTIAL/PLATELET
BASO%: 0.3 % (ref 0.0–2.0)
Basophils Absolute: 0 10*3/uL (ref 0.0–0.1)
EOS ABS: 0 10*3/uL (ref 0.0–0.5)
EOS%: 0.3 % (ref 0.0–7.0)
HCT: 31.9 % — ABNORMAL LOW (ref 34.8–46.6)
HEMOGLOBIN: 10.5 g/dL — AB (ref 11.6–15.9)
LYMPH#: 1.5 10*3/uL (ref 0.9–3.3)
LYMPH%: 44.4 % (ref 14.0–49.7)
MCH: 34.7 pg — ABNORMAL HIGH (ref 25.1–34.0)
MCHC: 32.9 g/dL (ref 31.5–36.0)
MCV: 105.3 fL — ABNORMAL HIGH (ref 79.5–101.0)
MONO#: 0.2 10*3/uL (ref 0.1–0.9)
MONO%: 4.5 % (ref 0.0–14.0)
NEUT%: 50.5 % (ref 38.4–76.8)
NEUTROS ABS: 1.7 10*3/uL (ref 1.5–6.5)
Platelets: 103 10*3/uL — ABNORMAL LOW (ref 145–400)
RBC: 3.03 10*6/uL — ABNORMAL LOW (ref 3.70–5.45)
RDW: 14.4 % (ref 11.2–14.5)
WBC: 3.3 10*3/uL — AB (ref 3.9–10.3)

## 2015-02-08 LAB — COMPREHENSIVE METABOLIC PANEL (CC13)
ALBUMIN: 3.6 g/dL (ref 3.5–5.0)
ALK PHOS: 76 U/L (ref 40–150)
ALT: 52 U/L (ref 0–55)
ANION GAP: 10 meq/L (ref 3–11)
AST: 30 U/L (ref 5–34)
BUN: 16.6 mg/dL (ref 7.0–26.0)
CHLORIDE: 107 meq/L (ref 98–109)
CO2: 25 meq/L (ref 22–29)
Calcium: 8.8 mg/dL (ref 8.4–10.4)
Creatinine: 0.7 mg/dL (ref 0.6–1.1)
GLUCOSE: 92 mg/dL (ref 70–140)
Potassium: 4.4 mEq/L (ref 3.5–5.1)
Sodium: 142 mEq/L (ref 136–145)
Total Bilirubin: 0.42 mg/dL (ref 0.20–1.20)
Total Protein: 6.5 g/dL (ref 6.4–8.3)

## 2015-02-08 MED ORDER — MORPHINE SULFATE 15 MG PO TABS: 15.0000 mg | ORAL_TABLET | Freq: Two times a day (BID) | ORAL | Status: DC | PRN

## 2015-02-08 MED ORDER — SODIUM CHLORIDE 0.9 % IJ SOLN
10.0000 mL | INTRAMUSCULAR | Status: DC | PRN
  Administered 2015-02-08: 10 mL via INTRAVENOUS
  Filled 2015-02-08: qty 10

## 2015-02-08 MED ORDER — PROCHLORPERAZINE MALEATE 10 MG PO TABS: 10.0000 mg | ORAL_TABLET | Freq: Four times a day (QID) | ORAL | Status: DC | PRN

## 2015-02-08 MED ORDER — HEPARIN SOD (PORK) LOCK FLUSH 100 UNIT/ML IV SOLN
500.0000 [IU] | Freq: Once | INTRAVENOUS | Status: AC
  Administered 2015-02-08: 500 [IU] via INTRAVENOUS
  Filled 2015-02-08: qty 5

## 2015-02-08 NOTE — Telephone Encounter (Signed)
Per staff message and POF I have scheduled appts. Advised scheduler of appts. JMW  

## 2015-02-08 NOTE — Addendum Note (Signed)
Addended by: Elray Buba LE on: 02/08/2015 10:47 AM   Modules accepted: Medications

## 2015-02-08 NOTE — Telephone Encounter (Signed)
Pt confirmed labs/ov per 05/26 POF, gave pt AVS and Calendar..... KJ °

## 2015-02-08 NOTE — [facility] Oncology Navigator Note (Signed)
Oncology Nurse Navigator Documentation  Oncology Nurse Navigator Flowsheets 02/08/2015  Navigator Encounter Type In person-  Patient Visit Type Medonc  Treatment Phase Treatment  Barriers/Navigation Needs No barriers at this time. Spoke with patient and daughter and she feels she is doing well and has no unmet needs at this time.  Time Spent with Patient 5

## 2015-02-08 NOTE — Patient Instructions (Signed)

## 2015-02-08 NOTE — Progress Notes (Signed)
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 Hepatosplenomegaly. Abdominal and pelvic ascites and Pathologically enlarged upper abdominal. CT chest (-).    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 Bipolar 2. Hypothyroidism   CURRENT THERAPY: Cisplatin 25 mg/m, gemcitabine 1000 mg/m (changed to 8 50 mg/m from cycle 6 due to cytopenia), on day 1 and 8, every 21 days started on 10/10/2014  INTERVAL HISTORY: [name] returns for follow-up. She tolerated this cycle 61 chemotherapy very well, no noticeable side effects. She takes morphine extended release one tablet at night, not taking oxycodone during the day, has good appetite and anterior level, physically active, no fever or chills or bleeding.  REVIEW OF SYSTEMS:   Constitutional: Denies fevers, chills, (+)  fatigued  Eyes: Denies blurriness of vision Ears, nose, mouth, throat, and face: Denies mucositis or sore throat Respiratory: Denies cough, dyspnea or wheezes Cardiovascular: Denies palpitation, chest discomfort or lower extremity swelling Gastrointestinal:  no nausea, no heartburn or change in bowel habits Skin: Denies abnormal skin  rashes.  Lymphatics: Denies new lymphadenopathy or easy bruising Neurological:Denies numbness, tingling or new weaknesses Behavioral/Psych: Mood is stable, no new changes  All other systems were reviewed with the patient and are negative.  I have reviewed the past medical history, past surgical history, social history and family history with the patient and they are unchanged from previous note.  ALLERGIES:  is allergic to buspar; cymbalta; hydroxyzine; and sertraline.  MEDICATIONS:  Current Outpatient Prescriptions  Medication Sig Dispense Refill  . amLODipine (NORVASC) 5 MG tablet Take 5 mg by mouth daily.    . benztropine (COGENTIN) 1 MG tablet Take 1 tablet (1 mg total) by mouth at bedtime. 90 tablet 2  . buPROPion (WELLBUTRIN XL) 300 MG 24 hr tablet Take 1 tablet (300 mg total) by mouth daily. 90 tablet 2  . lactulose (CHRONULAC) 10 GM/15ML solution 15 ml q 4 hr as needed for constipation.  Max of 60 ml per day (Patient taking differently: Take 10 g by mouth daily as needed. 30 ml q 4 hr as needed for constipation.  Max of 60 ml per day) 500 mL 2  . levothyroxine (SYNTHROID, LEVOTHROID) 88 MCG tablet Take 88 mcg by mouth at bedtime.     . lidocaine-prilocaine (EMLA) cream Apply 1 application topically as needed. Apply to port 1 hr before treatment as instructed. 30 g 0  . morphine (MSIR) 15 MG tablet Take 15 mg by mouth every 12 (twelve) hours as needed for moderate pain or severe pain.    Marland Kitchen ondansetron (ZOFRAN) 4 MG tablet Take 1 tablet (4 mg total) by mouth 4 (four) times daily as needed for nausea or vomiting. 60 tablet 0  . oxyCODONE (OXY IR/ROXICODONE) 5 MG immediate release tablet Take 1  tablet (5 mg total) by mouth every 4 (four) hours as needed for severe pain. 120 tablet 0  . prochlorperazine (COMPAZINE) 10 MG tablet TAKE ONE TABLET BY MOUTH EVERY 6 HOURS AS NEEDED FOR NAUSEA OR VOMITING 30 tablet 0  . propranolol (INDERAL) 10 MG tablet Take 1 tablet (10 mg total) by mouth 3  (three) times daily. 270 tablet 2  . risperiDONE (RISPERDAL) 3 MG tablet Take 1 tablet (3 mg total) by mouth at bedtime. 90 tablet 2  . zolpidem (AMBIEN) 10 MG tablet Take 1 tablet (10 mg total) by mouth at bedtime as needed for sleep. 90 tablet 1   No current facility-administered medications for this visit.   Facility-Administered Medications Ordered in Other Visits  Medication Dose Route Frequency Provider Last Rate Last Dose  . sodium chloride 0.9 % injection 10 mL  10 mL Intravenous PRN [name], MD   10 mL at 02/08/15 0836  . Tbo-Filgrastim (GRANIX) injection 300 mcg  300 mcg Subcutaneous Once [name], MD        PHYSICAL EXAMINATION: ECOG PERFORMANCE STATUS: 2   Filed Vitals:   02/08/15 0849  BP: 155/90  Pulse: 66  Temp: 97.8 F (36.6 C)  Resp: 18   Filed Weights   02/08/15 0849  Weight: 120 lb 11.2 oz (54.749 kg)    GENERAL:alert, no distress and comfortable SKIN: skin color, texture, turgor are normal, no rashes or significant lesions EYES: normal, Conjunctiva are pink and non-injected, sclera clear OROPHARYNX:no exudate, no erythema and lips, buccal mucosa, and tongue normal  NECK: supple, thyroid normal size, non-tender, without nodularity LYMPH:  no palpable lymphadenopathy in the cervical, axillary or inguinal LUNGS: clear to auscultation and percussion with normal breathing effort HEART: regular rate & rhythm and no murmurs and no lower extremity edema ABDOMEN:abdomen soft,  and normal bowel sounds. Non-distended, (+) hepatomegaly has improved, about 6 cm below rib cage now  Musculoskeletal:no cyanosis of digits and no clubbing  NEURO: alert & oriented x 3 with fluent speech, no focal motor/sensory deficits  LABORATORY DATA:  I have reviewed the data as listed  CBC Latest Ref Rng 02/08/2015 02/02/2015 01/24/2015  WBC 3.9 - 10.3 10e3/uL 3.3(L) 4.4 3.7(L)  Hemoglobin 11.6 - 15.9 g/dL 10.5(L) 10.8(L) 8.3(L)  Hematocrit 34.8 - 46.6 % 31.9(L) 32.1(L) 25.9(L)    Platelets 145 - 400 10e3/uL 103(L) 188 73(L)    CMP Latest Ref Rng 02/02/2015 01/24/2015 01/19/2015  Glucose 70 - 140 mg/dl 93 87 98  BUN 7.0 - 26.0 mg/dL 16.5 11.5 18.3  Creatinine 0.6 - 1.1 mg/dL 0.8 0.7 0.8  Sodium 136 - 145 mEq/L 142 143 140  Potassium 3.5 - 5.1 mEq/L 4.3 4.2 3.9  Chloride 96 - 112 mEq/L - - -  CO2 22 - 29 mEq/L 25 25 22   Calcium 8.4 - 10.4 mg/dL 9.1 9.1 9.0  Total Protein 6.4 - 8.3 g/dL 6.5 6.2(L) 6.3(L)  Total Bilirubin 0.20 - 1.20 mg/dL 0.40 0.36 0.36  Alkaline Phos 40 - 150 U/L 77 77 87  AST 5 - 34 U/L 20 19 24   ALT 0 - 55 U/L 15 24 55   CA 19.9  Status: Finalresult Visible to patient:  MyChart Nextappt: Today at 10:15 AM in Oncology ([facility]) Dx:  Metastatic adenocarcinoma              Ref Range 2wk ago (12/22/14) 31mo ago (12/01/14) 77mo ago (11/24/14)    CA 19-9 <35.0 U/mL 7631.0 (H) 15297.2 (H)CM 16993.2 (H)CM  Surgical path 08/03/14 - ADENOCARCINOMA. Microscopic Comment The core biopsies are extensively involved by adenocarcinoma with focal goblet cells and foci with signet ring morphology. Immunohistochemistry will be performed and reported as an addendum. (JDP:gt, 08/04/14) Addendum: Immunohistochemistry is performed and the tumor is positive with Cytokeratin 7 and negative with Cytokeratin 20, CDX2, thyroid transcription factor-1, Napsin A, estrogen receptor, progesterone receptor and gross cystic disease fluid protein. The immunophenotype is nonspecific.  RADIOGRAPHIC STUDIES: I have personally reviewed the radiological images as listed and agreed with the findings in the report.  CT CHEST, ABDOMEN AND PELVIS IMPRESSION 01/03/2015 IMPRESSION: 1. Stable appearance of the small left supraclavicular lymph node. 2. Interval decrease in size of the hepatic metastases. No new or progressive liver lesions are evident. 3. Necrotic portal caval lymph node shows no substantial interval change. 4. Persistent  abnormal soft tissue attenuation encasing the celiac axis and SMA.   I have reviewed her EGD and colonoscopy reports which was done on 11/20, which was negative for ulceration or mass.   ASSESSMENT & PLAN:  54 yo female with PMH of bipolar but otherwise healthy, no history of liver disease or alcohol abuse, no history of blood transfusion, who presented with significant N/V, abdominal pain and bloating, New discovered multiple liver masses and small ascites, and elevated serum beta HCG, CA125 and CEA.   1. 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 -The patient and her family understand the overall poor prognosis, and her treatment goal is palliative - Her case was reviewed in our GI tumor Board. The radiologist feels her liver lesions are most consistent with metastatic lesions, not typical for cholangiocarcinoma, but pathology morphology favors cholangiocarcinoma or pancreatic cancer. CA19.9 57,711. 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  -I reviewed her restaging CT on 01/03/2015 which showed continue responding to treatment, no new lesions or ascites. She is clinically doing much better too.  -Tumor marker CA 19.9 significantly trending down, consistent with good response to chemotherapy -will continue chemo, planning for up to 8 cycles -Giving the significant cytopenia,  gemcitabine dose reduction by 15% from cycle 6 -Lab reviewed, we'll proceed cycle 6 day 8 treatment tomorrow   2. Nausea and vomiting -resolved lately  -Likely related to chemotherapy. -I suggest her to use Zofran and Compazine alternatively, 20-30 minutes before each meal. -She knows to take nutrition supplement, high-protein high calorie diet, and drink adequate fluids.  3. Anemia, related to chemotherapy and underline malignancy  -Improved after blood transfusion  4. Thrombocytopenia, secondary  to chemotherapy -Gemcitabine dosed reduced, continue close monitoring  5. malignant ascites -She had paracentesis once --near resolved now, as part of tumor response to chemo    6. Malnutrition and deconditioning secondary to her probably underline malignancy  -I encouraged her to take nutrition supplement, and continue home PT.  -much Improved  7. Bipolar -stable mood, continue meds  8. Abdominal pain -Improved and controlled. She is only taking morphine extensor release 15 mg at night.   Plan -Cycle 6 Day 8 chemotherapy tomorrow with gemcitabine dosed reduced to 850 mg/m -I refilled her morphine ER today  -restaging CT before cycle 7 in 2 weeks    All questions were answered. The patient knows to call the clinic with any problems, questions or concerns. No barriers to learning was detected.  I spent 20 minutes counseling the patient face to face. The total time spent in the appointment was 25 minutes and  more than 50% was on counseling and review of test results     [name], MD 02/08/2015

## 2015-02-09 ENCOUNTER — Ambulatory Visit (HOSPITAL_BASED_OUTPATIENT_CLINIC_OR_DEPARTMENT_OTHER): Payer: Medicare HMO

## 2015-02-09 VITALS — BP 144/95 | HR 65 | Temp 98.0°F | Resp 18

## 2015-02-09 DIAGNOSIS — C787 Secondary malignant neoplasm of liver and intrahepatic bile duct: Secondary | ICD-10-CM | POA: Diagnosis not present

## 2015-02-09 DIAGNOSIS — C801 Malignant (primary) neoplasm, unspecified: Secondary | ICD-10-CM

## 2015-02-09 DIAGNOSIS — Z5111 Encounter for antineoplastic chemotherapy: Secondary | ICD-10-CM

## 2015-02-09 MED ORDER — PALONOSETRON HCL INJECTION 0.25 MG/5ML
INTRAVENOUS | Status: AC
  Filled 2015-02-09: qty 5

## 2015-02-09 MED ORDER — SODIUM CHLORIDE 0.9 % IV SOLN
Freq: Once | INTRAVENOUS | Status: AC
  Administered 2015-02-09: 14:00:00 via INTRAVENOUS
  Filled 2015-02-09: qty 5

## 2015-02-09 MED ORDER — SODIUM CHLORIDE 0.9 % IV SOLN
Freq: Once | INTRAVENOUS | Status: AC
  Administered 2015-02-09: 10:00:00 via INTRAVENOUS

## 2015-02-09 MED ORDER — HEPARIN SOD (PORK) LOCK FLUSH 100 UNIT/ML IV SOLN
500.0000 [IU] | Freq: Once | INTRAVENOUS | Status: AC | PRN
  Administered 2015-02-09: 500 [IU]
  Filled 2015-02-09: qty 5

## 2015-02-09 MED ORDER — SODIUM CHLORIDE 0.9 % IJ SOLN
10.0000 mL | INTRAMUSCULAR | Status: DC | PRN
  Administered 2015-02-09: 10 mL
  Filled 2015-02-09: qty 10

## 2015-02-09 MED ORDER — PALONOSETRON HCL INJECTION 0.25 MG/5ML
0.2500 mg | Freq: Once | INTRAVENOUS | Status: AC
  Administered 2015-02-09: 0.25 mg via INTRAVENOUS

## 2015-02-09 MED ORDER — SODIUM CHLORIDE 0.9 % IV SOLN
25.0000 mg/m2 | Freq: Once | INTRAVENOUS | Status: AC
  Administered 2015-02-09: 39 mg via INTRAVENOUS
  Filled 2015-02-09: qty 39

## 2015-02-09 MED ORDER — SODIUM CHLORIDE 0.9 % IV SOLN
850.0000 mg/m2 | Freq: Once | INTRAVENOUS | Status: AC
  Administered 2015-02-09: 1330 mg via INTRAVENOUS
  Filled 2015-02-09: qty 35

## 2015-02-09 MED ORDER — POTASSIUM CHLORIDE 2 MEQ/ML IV SOLN
Freq: Once | INTRAVENOUS | Status: AC
  Administered 2015-02-09: 10:00:00 via INTRAVENOUS
  Filled 2015-02-09: qty 10

## 2015-02-09 NOTE — Patient Instructions (Signed)
[facility] Discharge Instructions for Patients Receiving Chemotherapy  Today you received the following chemotherapy agents: gemzar, cisplatin  To help prevent nausea and vomiting after your treatment, we encourage you to take your nausea medication.  Take it as often as prescribed.     If you develop nausea and vomiting that is not controlled by your nausea medication, call the clinic. If it is after clinic hours your family physician or the after hours number for the clinic or go to the Emergency Department.   BELOW ARE SYMPTOMS THAT SHOULD BE REPORTED IMMEDIATELY:  *FEVER GREATER THAN 100.5 F  *CHILLS WITH OR WITHOUT FEVER  NAUSEA AND VOMITING THAT IS NOT CONTROLLED WITH YOUR NAUSEA MEDICATION  *UNUSUAL SHORTNESS OF BREATH  *UNUSUAL BRUISING OR BLEEDING  TENDERNESS IN MOUTH AND THROAT WITH OR WITHOUT PRESENCE OF ULCERS  *URINARY PROBLEMS  *BOWEL PROBLEMS  UNUSUAL RASH Items with * indicate a potential emergency and should be followed up as soon as possible.  Feel free to call the clinic you have any questions or concerns. The clinic phone number is [phone].   I have been informed and understand all the instructions given to me. I know to contact the clinic, my physician, or go to the Emergency Department if any problems should occur. I do not have any questions at this time, but understand that I may call the clinic during office hours   should I have any questions or need assistance in obtaining follow up care.    __________________________________________  _____________  __________ Signature of Patient or Authorized Representative            Date                   Time    __________________________________________ Nurse's Signature

## 2015-02-19 ENCOUNTER — Telehealth: Payer: Self-pay | Admitting: Hematology

## 2015-02-19 ENCOUNTER — Encounter (HOSPITAL_COMMUNITY): Payer: Self-pay

## 2015-02-19 ENCOUNTER — Ambulatory Visit (HOSPITAL_COMMUNITY)
Admission: RE | Admit: 2015-02-19 | Discharge: 2015-02-19 | Disposition: A | Discharge status: Home / Self Care | Payer: Medicare HMO | Source: Ambulatory Visit | Attending: Hematology | Admitting: Hematology

## 2015-02-19 DIAGNOSIS — C787 Secondary malignant neoplasm of liver and intrahepatic bile duct: Secondary | ICD-10-CM | POA: Insufficient documentation

## 2015-02-19 DIAGNOSIS — C227 Other specified carcinomas of liver: Secondary | ICD-10-CM | POA: Insufficient documentation

## 2015-02-19 DIAGNOSIS — C801 Malignant (primary) neoplasm, unspecified: Secondary | ICD-10-CM

## 2015-02-19 HISTORY — DX: Malignant neoplasm of liver, not specified as primary or secondary: C22.9

## 2015-02-19 MED ORDER — IOHEXOL 300 MG/ML  SOLN
100.0000 mL | Freq: Once | INTRAMUSCULAR | Status: AC | PRN
  Administered 2015-02-19: 100 mL via INTRAVENOUS

## 2015-02-19 NOTE — Telephone Encounter (Signed)
Due to GI [facility] switch 7/15 f/u with YF moved to 8:30 am and lab/flush to 8 am. Spoke with patient spouse and patient to get new schedule 6/10. Message to YF/ re 8:30 am f/u time due to 8:15 am does not allow time for lab from port.

## 2015-02-23 ENCOUNTER — Ambulatory Visit (HOSPITAL_BASED_OUTPATIENT_CLINIC_OR_DEPARTMENT_OTHER): Payer: Medicare HMO | Admitting: Physician Assistant

## 2015-02-23 ENCOUNTER — Ambulatory Visit (HOSPITAL_BASED_OUTPATIENT_CLINIC_OR_DEPARTMENT_OTHER): Payer: Medicare HMO

## 2015-02-23 ENCOUNTER — Ambulatory Visit: Payer: Medicare HMO

## 2015-02-23 ENCOUNTER — Encounter: Payer: Self-pay | Admitting: Physician Assistant

## 2015-02-23 ENCOUNTER — Other Ambulatory Visit (HOSPITAL_BASED_OUTPATIENT_CLINIC_OR_DEPARTMENT_OTHER): Payer: Medicare HMO

## 2015-02-23 ENCOUNTER — Telehealth: Payer: Self-pay | Admitting: Hematology

## 2015-02-23 VITALS — BP 144/95 | HR 69 | Temp 97.7°F | Resp 18 | Ht 66.0 in | Wt 123.7 lb

## 2015-02-23 DIAGNOSIS — D630 Anemia in neoplastic disease: Secondary | ICD-10-CM

## 2015-02-23 DIAGNOSIS — D6481 Anemia due to antineoplastic chemotherapy: Secondary | ICD-10-CM

## 2015-02-23 DIAGNOSIS — C787 Secondary malignant neoplasm of liver and intrahepatic bile duct: Secondary | ICD-10-CM

## 2015-02-23 DIAGNOSIS — Z95828 Presence of other vascular implants and grafts: Secondary | ICD-10-CM

## 2015-02-23 DIAGNOSIS — R180 Malignant ascites: Secondary | ICD-10-CM

## 2015-02-23 DIAGNOSIS — C801 Malignant (primary) neoplasm, unspecified: Secondary | ICD-10-CM

## 2015-02-23 DIAGNOSIS — F319 Bipolar disorder, unspecified: Secondary | ICD-10-CM

## 2015-02-23 DIAGNOSIS — E46 Unspecified protein-calorie malnutrition: Secondary | ICD-10-CM

## 2015-02-23 DIAGNOSIS — C799 Secondary malignant neoplasm of unspecified site: Secondary | ICD-10-CM

## 2015-02-23 DIAGNOSIS — R112 Nausea with vomiting, unspecified: Secondary | ICD-10-CM

## 2015-02-23 DIAGNOSIS — Z5111 Encounter for antineoplastic chemotherapy: Secondary | ICD-10-CM | POA: Diagnosis not present

## 2015-02-23 LAB — CBC WITH DIFFERENTIAL/PLATELET
BASO%: 0.3 % (ref 0.0–2.0)
Basophils Absolute: 0 10*3/uL (ref 0.0–0.1)
EOS%: 1 % (ref 0.0–7.0)
Eosinophils Absolute: 0 10*3/uL (ref 0.0–0.5)
HEMATOCRIT: 29.1 % — AB (ref 34.8–46.6)
HGB: 9.8 g/dL — ABNORMAL LOW (ref 11.6–15.9)
LYMPH%: 27.8 % (ref 14.0–49.7)
MCH: 35.1 pg — AB (ref 25.1–34.0)
MCHC: 33.6 g/dL (ref 31.5–36.0)
MCV: 104.3 fL — ABNORMAL HIGH (ref 79.5–101.0)
MONO#: 0.7 10*3/uL (ref 0.1–0.9)
MONO%: 14.6 % — ABNORMAL HIGH (ref 0.0–14.0)
NEUT#: 2.6 10*3/uL (ref 1.5–6.5)
NEUT%: 56.3 % (ref 38.4–76.8)
Platelets: 91 10*3/uL — ABNORMAL LOW (ref 145–400)
RBC: 2.79 10*6/uL — AB (ref 3.70–5.45)
RDW: 15.6 % — AB (ref 11.2–14.5)
WBC: 4.7 10*3/uL (ref 3.9–10.3)
lymph#: 1.3 10*3/uL (ref 0.9–3.3)

## 2015-02-23 LAB — COMPREHENSIVE METABOLIC PANEL (CC13)
ALT: 23 U/L (ref 0–55)
AST: 20 U/L (ref 5–34)
Albumin: 3.6 g/dL (ref 3.5–5.0)
Alkaline Phosphatase: 76 U/L (ref 40–150)
Anion Gap: 10 mEq/L (ref 3–11)
BILIRUBIN TOTAL: 0.45 mg/dL (ref 0.20–1.20)
BUN: 13.8 mg/dL (ref 7.0–26.0)
CALCIUM: 9.1 mg/dL (ref 8.4–10.4)
CO2: 23 mEq/L (ref 22–29)
Chloride: 109 mEq/L (ref 98–109)
Creatinine: 0.8 mg/dL (ref 0.6–1.1)
EGFR: 86 mL/min/{1.73_m2} — AB (ref >90)
Glucose: 90 mg/dl (ref 70–140)
Potassium: 4 mEq/L (ref 3.5–5.1)
Sodium: 142 mEq/L (ref 136–145)
Total Protein: 6.4 g/dL (ref 6.4–8.3)

## 2015-02-23 LAB — MAGNESIUM (CC13): MAGNESIUM: 1.9 mg/dL (ref 1.5–2.5)

## 2015-02-23 MED ORDER — HEPARIN SOD (PORK) LOCK FLUSH 100 UNIT/ML IV SOLN
500.0000 [IU] | Freq: Once | INTRAVENOUS | Status: DC
  Filled 2015-02-23: qty 5

## 2015-02-23 MED ORDER — PALONOSETRON HCL INJECTION 0.25 MG/5ML
INTRAVENOUS | Status: AC
  Filled 2015-02-23: qty 5

## 2015-02-23 MED ORDER — HEPARIN SOD (PORK) LOCK FLUSH 100 UNIT/ML IV SOLN
500.0000 [IU] | Freq: Once | INTRAVENOUS | Status: AC | PRN
  Administered 2015-02-23: 500 [IU]
  Filled 2015-02-23: qty 5

## 2015-02-23 MED ORDER — SODIUM CHLORIDE 0.9 % IV SOLN
25.0000 mg/m2 | Freq: Once | INTRAVENOUS | Status: AC
  Administered 2015-02-23: 39 mg via INTRAVENOUS
  Filled 2015-02-23: qty 39

## 2015-02-23 MED ORDER — POTASSIUM CHLORIDE 2 MEQ/ML IV SOLN
Freq: Once | INTRAVENOUS | Status: AC
  Administered 2015-02-23: 10:00:00 via INTRAVENOUS
  Filled 2015-02-23: qty 10

## 2015-02-23 MED ORDER — PALONOSETRON HCL INJECTION 0.25 MG/5ML
0.2500 mg | Freq: Once | INTRAVENOUS | Status: AC
  Administered 2015-02-23: 0.25 mg via INTRAVENOUS

## 2015-02-23 MED ORDER — SODIUM CHLORIDE 0.9 % IV SOLN
800.0000 mg/m2 | Freq: Once | INTRAVENOUS | Status: AC
  Administered 2015-02-23: 1216 mg via INTRAVENOUS
  Filled 2015-02-23: qty 31.98

## 2015-02-23 MED ORDER — SODIUM CHLORIDE 0.9 % IJ SOLN
10.0000 mL | INTRAMUSCULAR | Status: DC | PRN
  Administered 2015-02-23: 10 mL
  Filled 2015-02-23: qty 10

## 2015-02-23 MED ORDER — SODIUM CHLORIDE 0.9 % IV SOLN
Freq: Once | INTRAVENOUS | Status: AC
  Administered 2015-02-23: 10:00:00 via INTRAVENOUS

## 2015-02-23 MED ORDER — SODIUM CHLORIDE 0.9 % IV SOLN
Freq: Once | INTRAVENOUS | Status: AC
  Administered 2015-02-23: 10:00:00 via INTRAVENOUS
  Filled 2015-02-23: qty 5

## 2015-02-23 MED ORDER — SODIUM CHLORIDE 0.9 % IJ SOLN
10.0000 mL | INTRAMUSCULAR | Status: DC | PRN
  Administered 2015-02-23: 10 mL via INTRAVENOUS
  Filled 2015-02-23: qty 10

## 2015-02-23 NOTE — Patient Instructions (Signed)
[facility] Discharge Instructions for Patients Receiving Chemotherapy  Today you received the following chemotherapy agents: gemzar, cisplatin  To help prevent nausea and vomiting after your treatment, we encourage you to take your nausea medication.  Take it as often as prescribed.     If you develop nausea and vomiting that is not controlled by your nausea medication, call the clinic. If it is after clinic hours your family physician or the after hours number for the clinic or go to the Emergency Department.   BELOW ARE SYMPTOMS THAT SHOULD BE REPORTED IMMEDIATELY:  *FEVER GREATER THAN 100.5 F  *CHILLS WITH OR WITHOUT FEVER  NAUSEA AND VOMITING THAT IS NOT CONTROLLED WITH YOUR NAUSEA MEDICATION  *UNUSUAL SHORTNESS OF BREATH  *UNUSUAL BRUISING OR BLEEDING  TENDERNESS IN MOUTH AND THROAT WITH OR WITHOUT PRESENCE OF ULCERS  *URINARY PROBLEMS  *BOWEL PROBLEMS  UNUSUAL RASH Items with * indicate a potential emergency and should be followed up as soon as possible.  Feel free to call the clinic you have any questions or concerns. The clinic phone number is [phone].   I have been informed and understand all the instructions given to me. I know to contact the clinic, my physician, or go to the Emergency Department if any problems should occur. I do not have any questions at this time, but understand that I may call the clinic during office hours   should I have any questions or need assistance in obtaining follow up care.    __________________________________________  _____________  __________ Signature of Patient or Authorized Representative            Date                   Time    __________________________________________ Nurse's Signature

## 2015-02-23 NOTE — Progress Notes (Signed)
Per Adrena PA, okay to tx with platelets 91.

## 2015-02-23 NOTE — Progress Notes (Addendum)
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 Hepatosplenomegaly. Abdominal and pelvic ascites and Pathologically enlarged upper abdominal. CT chest (-).    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 Bipolar 2. Hypothyroidism   CURRENT THERAPY: Cisplatin 25 mg/m, gemcitabine 1000 mg/m (changed to 8 50 mg/m from cycle 6 due to cytopenia), on day 1 and 8, every 21 days started on 10/10/2014  INTERVAL HISTORY: Candida returns for a follow-up visit. She is status post 6 cycles of systemic chemotherapy with cisplatin and gemcitabine. Overall she's tolerated this treatment relatively well with the exception of nausea and vomiting that is relatively well managed with Compazine. She requests a refill of this medication. She was prescribed Zofran, however was at 4 mg and she did not find this helpful.  She takes morphine extended release one tablet at night, not taking oxycodone during the day, continues to have a good appetite and energy level, and physically active.  Denied fever, chills or bleeding. She recently had a restaging CT scan of the chest, abdomen and pelvis and presents to discuss  results.  REVIEW OF SYSTEMS:   Constitutional: Denies fevers, chills, (+)  fatigued  Eyes: Denies blurriness of vision Ears, nose, mouth, throat, and face: Denies mucositis or sore throat Respiratory: Denies cough, dyspnea or wheezes Cardiovascular: Denies palpitation, chest discomfort or lower extremity swelling Gastrointestinal:  no nausea, no heartburn or change in bowel habits Skin: Denies abnormal skin rashes.  Lymphatics: Denies new lymphadenopathy or easy bruising Neurological:Denies numbness, tingling or new weaknesses Behavioral/Psych: Mood is stable, no new changes  All other systems were reviewed with the patient and are negative.  I have reviewed the past medical history, past surgical history, social history and family history with the patient and they are unchanged from previous note.  ALLERGIES:  is allergic to buspar; cymbalta; hydroxyzine; and sertraline.  MEDICATIONS:  Current Outpatient Prescriptions  Medication Sig Dispense Refill  . amLODipine (NORVASC) 5 MG tablet Take 5 mg by mouth daily.    . benztropine (COGENTIN) 1 MG tablet Take 1 tablet (1 mg total) by mouth at bedtime. 90 tablet 2  . buPROPion (WELLBUTRIN XL) 300 MG 24 hr tablet Take 1 tablet (300 mg total) by mouth daily. 90 tablet 2  . levothyroxine (SYNTHROID, LEVOTHROID) 88 MCG tablet Take 88 mcg by mouth at bedtime.     . lidocaine-prilocaine (EMLA) cream Apply 1 application topically as needed. Apply to port 1 hr before treatment as instructed. 30 g 0  . morphine (MSIR) 15 MG tablet Take 1 tablet (15 mg total) by mouth every 12 (twelve) hours as needed for moderate pain or severe pain. 30 tablet 0  . oxyCODONE (OXY IR/ROXICODONE) 5 MG immediate release tablet Take 1 tablet (5 mg total) by mouth every  4 (four) hours as needed for severe pain. 120 tablet 0  . prochlorperazine (COMPAZINE) 10 MG tablet Take 1 tablet (10 mg total) by mouth every 6 (six) hours as needed for nausea or vomiting. 30 tablet 1  .  propranolol (INDERAL) 10 MG tablet Take 1 tablet (10 mg total) by mouth 3 (three) times daily. 270 tablet 2  . risperiDONE (RISPERDAL) 3 MG tablet Take 1 tablet (3 mg total) by mouth at bedtime. 90 tablet 2  . lactulose (CHRONULAC) 10 GM/15ML solution 15 ml q 4 hr as needed for constipation.  Max of 60 ml per day (Patient not taking: Reported on 02/23/2015) 500 mL 2  . ondansetron (ZOFRAN) 4 MG tablet Take 1 tablet (4 mg total) by mouth 4 (four) times daily as needed for nausea or vomiting. (Patient not taking: Reported on 02/23/2015) 60 tablet 0  . zolpidem (AMBIEN) 10 MG tablet Take 1 tablet (10 mg total) by mouth at bedtime as needed for sleep. 90 tablet 1   No current facility-administered medications for this visit.   Facility-Administered Medications Ordered in Other Visits  Medication Dose Route Frequency Provider Last Rate Last Dose  . sodium chloride 0.9 % injection 10 mL  10 mL Intracatheter PRN [name], MD   10 mL at 02/23/15 1332  . Tbo-Filgrastim (GRANIX) injection 300 mcg  300 mcg Subcutaneous Once [name], MD        PHYSICAL EXAMINATION: ECOG PERFORMANCE STATUS: 2   Filed Vitals:   02/23/15 0837  BP: 144/95  Pulse: 69  Temp: 97.7 F (36.5 C)  Resp: 18   Filed Weights   02/23/15 0837  Weight: 123 lb 11.2 oz (56.11 kg)    GENERAL:alert, no distress and comfortable SKIN: skin color, texture, turgor are normal, no rashes or significant lesions EYES: normal, Conjunctiva are pink and non-injected, sclera clear OROPHARYNX:no exudate, no erythema and lips, buccal mucosa, and tongue normal  NECK: supple, thyroid normal size, non-tender, without nodularity LYMPH:  no palpable lymphadenopathy in the cervical, axillary or inguinal LUNGS: clear to auscultation and percussion with normal breathing effort HEART: regular rate & rhythm and no murmurs and no lower extremity edema ABDOMEN:abdomen soft,  and normal bowel sounds. Non-distended, (+) hepatomegaly has improved, about 6  cm below rib cage now  Musculoskeletal:no cyanosis of digits and no clubbing  NEURO: alert & oriented x 3 with fluent speech, no focal motor/sensory deficits  LABORATORY DATA:  I have reviewed the data as listed  CBC Latest Ref Rng 02/23/2015 02/08/2015 02/02/2015  WBC 3.9 - 10.3 10e3/uL 4.7 3.3(L) 4.4  Hemoglobin 11.6 - 15.9 g/dL 9.8(L) 10.5(L) 10.8(L)  Hematocrit 34.8 - 46.6 % 29.1(L) 31.9(L) 32.1(L)  Platelets 145 - 400 10e3/uL 91(L) 103(L) 188    CMP Latest Ref Rng 02/23/2015 02/08/2015 02/02/2015  Glucose 70 - 140 mg/dl 90 92 93  BUN 7.0 - 26.0 mg/dL 13.8 16.6 16.5  Creatinine 0.6 - 1.1 mg/dL 0.8 0.7 0.8  Sodium 136 - 145 mEq/L 142 142 142  Potassium 3.5 - 5.1 mEq/L 4.0 4.4 4.3  Chloride 96 - 112 mEq/L - - -  CO2 22 - 29 mEq/L 23 25 25   Calcium 8.4 - 10.4 mg/dL 9.1 8.8 9.1  Total Protein 6.4 - 8.3 g/dL 6.4 6.5 6.5  Total Bilirubin 0.20 - 1.20 mg/dL 0.45 0.42 0.40  Alkaline Phos 40 - 150 U/L 76 76 77  AST 5 - 34 U/L 20 30 20   ALT 0 - 55 U/L 23 52 15  CA 19.9  Status: Finalresult Visible to patient:  MyChart Nextappt: Today at 10:15 AM in Oncology ([place] [facility]) Dx:  Metastatic adenocarcinoma              Ref Range 2wk ago (12/22/14) 69mo ago (12/01/14) 25mo ago (11/24/14)    CA 19-9 <35.0 U/mL 7631.0 (H) 15297.2 (H)CM 16993.2 (H)CM           Surgical path 08/03/14 - ADENOCARCINOMA. Microscopic Comment The core biopsies are extensively involved by adenocarcinoma with focal goblet cells and foci with signet ring morphology. Immunohistochemistry will be performed and reported as an addendum. (JDP:gt, 08/04/14) Addendum: Immunohistochemistry is performed and the tumor is positive with Cytokeratin 7 and negative with Cytokeratin 20, CDX2, thyroid transcription factor-1, Napsin A, estrogen receptor, progesterone receptor and gross cystic disease fluid protein. The immunophenotype is nonspecific.  RADIOGRAPHIC STUDIES: I have personally reviewed the  radiological images as listed and agreed with the findings in the report. Ct Chest W Contrast  02/19/2015   CLINICAL DATA:  Subsequent encounter for metastatic adenocarcinoma of unknown primary with liver involvement.  EXAM: CT CHEST, ABDOMEN, AND PELVIS WITH CONTRAST  TECHNIQUE: Multidetector CT imaging of the chest, abdomen and pelvis was performed following the standard protocol during bolus administration of intravenous contrast.  CONTRAST:  144mL OMNIPAQUE IOHEXOL 300 MG/ML  SOLN  COMPARISON:  01/03/2015.  11/16/2014.  FINDINGS: CT CHEST FINDINGS  Mediastinum/Nodes: Right Port-A-Cath tip is at the distal SVC level, near the junction with the RA. 7 mm left supraclavicular lymph node is 6 mm in short axis on the current study. Calcified left thyroid nodule is stable. No mediastinal or hilar lymphadenopathy. Heart size is normal. No pericardial effusion.  Lungs/Pleura: Lungs are clear bilaterally without pulmonary parenchymal nodule or mass. No focal airspace consolidation. No pulmonary edema or pleural effusion.  Musculoskeletal: Bone windows reveal no worrisome lytic or sclerotic osseous lesions.  CT ABDOMEN AND PELVIS FINDINGS  Hepatobiliary: Multiple ill-defined liver lesions are again noted. A dominant posterior right subcapsular lesion measured previously at 3.1 x 3.3 cm measures 3.1 x 3.2 cm when measured at the same level and in the same axes as on the previous exam. 11 mm right lobe index lesion measured previously is stable at 11 mm. 13 mm caudate lesion measured on the prior exam is unchanged at 13 mm.  Gallbladder is unremarkable. No intrahepatic or extrahepatic biliary dilation.  Pancreas: No focal mass lesion. No dilatation of the main duct. No intraparenchymal cyst. No peripancreatic edema.  Spleen: No splenomegaly. No focal mass lesion.  Adrenals/Urinary Tract: No adrenal nodule or mass. Kidneys are unremarkable. Tiny cortical cyst in the interpolar right kidney is unchanged. No evidence for  hydroureter. Urinary bladder has normal CT imaging features.  Stomach/Bowel: Stomach is nondistended. No gastric wall thickening. No evidence of outlet obstruction. Duodenum is normally positioned as is the ligament of Treitz. No small bowel wall thickening. No small bowel dilatation. Terminal ileum is normal. No gross colonic mass. No colonic wall thickening. No substantial diverticular change. Prominent stool volume raises the question of, but is not diagnostic for constipation.  Vascular/Lymphatic: No abdominal aortic aneurysm. Necrotic hepatoduodenal ligament lymph node is stable, measuring 1.9 x 3.1 cm today compared to 1.8 x 3.1 cm previously. AC soft tissue attenuation around the celiac axis and proximal SMA is stable. No pelvic sidewall lymphadenopathy.  Reproductive: Heterogeneous enhancement in the uterus is nonspecific. No adnexal mass.  Other: Trace intraperitoneal free fluid is seen in the pelvis.  Musculoskeletal: Bone  windows reveal no worrisome lytic or sclerotic osseous lesions.  IMPRESSION: 1. Stable left supraclavicular lymph node. 2. Hepatic metastases show no interval change. No new liver lesions are evident. 3. Stable appearance of the necrotic hepatoduodenal ligament lymph node. Abnormal soft tissue attenuation seen at the root of the celiac axis and SMA is unchanged.   Electronically Signed   By: Misty Stanley M.D.   On: 02/19/2015 13:05   Ct Abdomen Pelvis W Contrast  02/19/2015   CLINICAL DATA:  Subsequent encounter for metastatic adenocarcinoma of unknown primary with liver involvement.  EXAM: CT CHEST, ABDOMEN, AND PELVIS WITH CONTRAST  TECHNIQUE: Multidetector CT imaging of the chest, abdomen and pelvis was performed following the standard protocol during bolus administration of intravenous contrast.  CONTRAST:  117mL OMNIPAQUE IOHEXOL 300 MG/ML  SOLN  COMPARISON:  01/03/2015.  11/16/2014.  FINDINGS: CT CHEST FINDINGS  Mediastinum/Nodes: Right Port-A-Cath tip is at the distal SVC  level, near the junction with the RA. 7 mm left supraclavicular lymph node is 6 mm in short axis on the current study. Calcified left thyroid nodule is stable. No mediastinal or hilar lymphadenopathy. Heart size is normal. No pericardial effusion.  Lungs/Pleura: Lungs are clear bilaterally without pulmonary parenchymal nodule or mass. No focal airspace consolidation. No pulmonary edema or pleural effusion.  Musculoskeletal: Bone windows reveal no worrisome lytic or sclerotic osseous lesions.  CT ABDOMEN AND PELVIS FINDINGS  Hepatobiliary: Multiple ill-defined liver lesions are again noted. A dominant posterior right subcapsular lesion measured previously at 3.1 x 3.3 cm measures 3.1 x 3.2 cm when measured at the same level and in the same axes as on the previous exam. 11 mm right lobe index lesion measured previously is stable at 11 mm. 13 mm caudate lesion measured on the prior exam is unchanged at 13 mm.  Gallbladder is unremarkable. No intrahepatic or extrahepatic biliary dilation.  Pancreas: No focal mass lesion. No dilatation of the main duct. No intraparenchymal cyst. No peripancreatic edema.  Spleen: No splenomegaly. No focal mass lesion.  Adrenals/Urinary Tract: No adrenal nodule or mass. Kidneys are unremarkable. Tiny cortical cyst in the interpolar right kidney is unchanged. No evidence for hydroureter. Urinary bladder has normal CT imaging features.  Stomach/Bowel: Stomach is nondistended. No gastric wall thickening. No evidence of outlet obstruction. Duodenum is normally positioned as is the ligament of Treitz. No small bowel wall thickening. No small bowel dilatation. Terminal ileum is normal. No gross colonic mass. No colonic wall thickening. No substantial diverticular change. Prominent stool volume raises the question of, but is not diagnostic for constipation.  Vascular/Lymphatic: No abdominal aortic aneurysm. Necrotic hepatoduodenal ligament lymph node is stable, measuring 1.9 x 3.1 cm today  compared to 1.8 x 3.1 cm previously. AC soft tissue attenuation around the celiac axis and proximal SMA is stable. No pelvic sidewall lymphadenopathy.  Reproductive: Heterogeneous enhancement in the uterus is nonspecific. No adnexal mass.  Other: Trace intraperitoneal free fluid is seen in the pelvis.  Musculoskeletal: Bone windows reveal no worrisome lytic or sclerotic osseous lesions.  IMPRESSION: 1. Stable left supraclavicular lymph node. 2. Hepatic metastases show no interval change. No new liver lesions are evident. 3. Stable appearance of the necrotic hepatoduodenal ligament lymph node. Abnormal soft tissue attenuation seen at the root of the celiac axis and SMA is unchanged.   Electronically Signed   By: Misty Stanley M.D.   On: 02/19/2015 13:05     I have reviewed her EGD and colonoscopy reports which was done on  11/20, which was negative for ulceration or mass.   ASSESSMENT & PLAN:  54 yo female with PMH of bipolar but otherwise healthy, no history of liver disease or alcohol abuse, no history of blood transfusion, who presented with significant N/V, abdominal pain and bloating, New discovered multiple liver masses and small ascites, and elevated serum beta HCG, CA125 and CEA.   1. 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 -The patient and her family understand the overall poor prognosis, and her treatment goal is palliative - Her case was reviewed in our GI tumor Board. The radiologist feels her liver lesions are most consistent with metastatic lesions, not typical for cholangiocarcinoma, but pathology morphology favors cholangiocarcinoma or pancreatic cancer. CA19.9 57,711. 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  -Dr. Burr Medico reviewed her restaging CT on 01/03/2015 which showed continue responding to treatment, no new lesions or ascites. She is clinically doing much better too.  -Tumor  marker CA 19.9 significantly trending down, consistent with good response to chemotherapy -will continue chemo, planning for up to 8 cycles -Giving the significant cytopenia,  gemcitabine dose reduction by 15% from cycle 6 -Lab reviewed, we'll proceed cycle 6 day 8 treatment tomorrow   2. Nausea and vomiting -Still having intermittent episodes but reasonably controlled with her current antiemetics  -Likely related to chemotherapy. -I suggest her to use Zofran and Compazine alternatively, 20-30 minutes before each meal. She may increase the dose of her Zofran from 4 mg every 8 hours to 8 mg 38 hours as needed for nausea. She will utilize her current supply of 4 mg tablets with the increased dose and we will give her a new prescription for 8 mg tablets when she completes her current supply of 4 mg tablets. -She knows to take nutrition supplement, high-protein high calorie diet, and drink adequate fluids.  3. Anemia, related to chemotherapy and underline malignancy  -Improved after blood transfusion  4. Thrombocytopenia, secondary to chemotherapy -Gemcitabine dosed reduced, continue close monitoring  5. malignant ascites -She had paracentesis once --near resolved now, as part of tumor response to chemo    6. Malnutrition and deconditioning secondary to her probably underline malignancy  -I encouraged her to take nutrition supplement, and continue home PT.  -much Improved  7. Bipolar -stable mood, continue meds  8. Abdominal pain -Improved and controlled. She is only taking morphine extensor release 15 mg at night.   Plan -Cycle 7 Day 1 chemotherapy tomorrow with gemcitabine dosed reduced to 850 mg/m -I refilled hercompazine   -The restaging CT scan revealed stable disease with no evidence of new or progressive disease. These results were discussed patient and her daughter. She'll proceed with cycle #7 today as scheduled and follow-up with Dr. Burr Medico as previously scheduled in 3  weeks on 03/16/2015 prior to the start of cycle #8.  Patient was reviewed with Dr. Burr Medico.  All questions were answered. The patient knows to call the clinic with any problems, questions or concerns. No barriers to learning was detected.  I spent 25 minutes counseling the patient face to face. The total time spent in the appointment was 30 minutes and more than 50% was on counseling and review of test results     Carlton Adam, PA-C 02/23/2015

## 2015-02-23 NOTE — Telephone Encounter (Signed)
Gave and printed appt sched and avs for pt for June and July  °

## 2015-02-23 NOTE — Patient Instructions (Signed)

## 2015-02-24 LAB — CANCER ANTIGEN 19-9: CA 19-9: 1791.9 U/mL — ABNORMAL HIGH (ref <35.0)

## 2015-02-25 MED ORDER — PROCHLORPERAZINE MALEATE 10 MG PO TABS: 10.0000 mg | ORAL_TABLET | Freq: Four times a day (QID) | ORAL | Status: DC | PRN

## 2015-02-25 NOTE — Patient Instructions (Signed)
Your recent restaging CT scan revealed stable disease with out evidence of any new or progressive lesions Continue labs and chemotherapy as scheduled

## 2015-02-26 ENCOUNTER — Encounter: Payer: Self-pay | Admitting: Hematology

## 2015-02-26 NOTE — Progress Notes (Signed)
I faxed fmla forms for michelle(daughter) to [phone]

## 2015-03-02 ENCOUNTER — Other Ambulatory Visit (HOSPITAL_BASED_OUTPATIENT_CLINIC_OR_DEPARTMENT_OTHER): Payer: Medicare HMO

## 2015-03-02 ENCOUNTER — Ambulatory Visit (HOSPITAL_BASED_OUTPATIENT_CLINIC_OR_DEPARTMENT_OTHER): Payer: Medicare HMO

## 2015-03-02 ENCOUNTER — Ambulatory Visit: Payer: Medicare HMO

## 2015-03-02 VITALS — BP 123/82 | HR 73 | Temp 98.4°F | Resp 18

## 2015-03-02 DIAGNOSIS — C801 Malignant (primary) neoplasm, unspecified: Secondary | ICD-10-CM

## 2015-03-02 DIAGNOSIS — Z95828 Presence of other vascular implants and grafts: Secondary | ICD-10-CM

## 2015-03-02 DIAGNOSIS — D6481 Anemia due to antineoplastic chemotherapy: Secondary | ICD-10-CM

## 2015-03-02 DIAGNOSIS — C787 Secondary malignant neoplasm of liver and intrahepatic bile duct: Secondary | ICD-10-CM

## 2015-03-02 DIAGNOSIS — D630 Anemia in neoplastic disease: Secondary | ICD-10-CM | POA: Diagnosis not present

## 2015-03-02 DIAGNOSIS — Z5111 Encounter for antineoplastic chemotherapy: Secondary | ICD-10-CM | POA: Diagnosis not present

## 2015-03-02 LAB — COMPREHENSIVE METABOLIC PANEL (CC13)
ALK PHOS: 77 U/L (ref 40–150)
ALT: 41 U/L (ref 0–55)
AST: 25 U/L (ref 5–34)
Albumin: 3.7 g/dL (ref 3.5–5.0)
Anion Gap: 7 mEq/L (ref 3–11)
BUN: 12.8 mg/dL (ref 7.0–26.0)
CO2: 28 mEq/L (ref 22–29)
CREATININE: 0.9 mg/dL (ref 0.6–1.1)
Calcium: 9.3 mg/dL (ref 8.4–10.4)
Chloride: 106 mEq/L (ref 98–109)
EGFR: 77 mL/min/{1.73_m2} — ABNORMAL LOW (ref >90)
GLUCOSE: 94 mg/dL (ref 70–140)
Potassium: 4.2 mEq/L (ref 3.5–5.1)
Sodium: 140 mEq/L (ref 136–145)
Total Bilirubin: 0.33 mg/dL (ref 0.20–1.20)
Total Protein: 6.4 g/dL (ref 6.4–8.3)

## 2015-03-02 LAB — CBC WITH DIFFERENTIAL/PLATELET
BASO%: 0.2 % (ref 0.0–2.0)
Basophils Absolute: 0 10*3/uL (ref 0.0–0.1)
EOS%: 0.4 % (ref 0.0–7.0)
Eosinophils Absolute: 0 10*3/uL (ref 0.0–0.5)
HCT: 27.7 % — ABNORMAL LOW (ref 34.8–46.6)
HGB: 9.3 g/dL — ABNORMAL LOW (ref 11.6–15.9)
LYMPH%: 44.1 % (ref 14.0–49.7)
MCH: 34.4 pg — ABNORMAL HIGH (ref 25.1–34.0)
MCHC: 33.5 g/dL (ref 31.5–36.0)
MCV: 102.5 fL — ABNORMAL HIGH (ref 79.5–101.0)
MONO#: 0.4 10*3/uL (ref 0.1–0.9)
MONO%: 8.4 % (ref 0.0–14.0)
NEUT#: 2 10*3/uL (ref 1.5–6.5)
NEUT%: 46.9 % (ref 38.4–76.8)
PLATELETS: 139 10*3/uL — AB (ref 145–400)
RBC: 2.7 10*6/uL — AB (ref 3.70–5.45)
RDW: 14.9 % — ABNORMAL HIGH (ref 11.2–14.5)
WBC: 4.2 10*3/uL (ref 3.9–10.3)
lymph#: 1.9 10*3/uL (ref 0.9–3.3)

## 2015-03-02 LAB — TECHNOLOGIST REVIEW

## 2015-03-02 MED ORDER — SODIUM CHLORIDE 0.9 % IV SOLN
25.0000 mg/m2 | Freq: Once | INTRAVENOUS | Status: AC
  Administered 2015-03-02: 39 mg via INTRAVENOUS
  Filled 2015-03-02: qty 39

## 2015-03-02 MED ORDER — SODIUM CHLORIDE 0.9 % IJ SOLN
10.0000 mL | INTRAMUSCULAR | Status: DC | PRN
  Administered 2015-03-02: 10 mL
  Filled 2015-03-02: qty 10

## 2015-03-02 MED ORDER — PALONOSETRON HCL INJECTION 0.25 MG/5ML
INTRAVENOUS | Status: AC
  Filled 2015-03-02: qty 5

## 2015-03-02 MED ORDER — SODIUM CHLORIDE 0.9 % IJ SOLN
10.0000 mL | INTRAMUSCULAR | Status: DC | PRN
  Administered 2015-03-02: 10 mL via INTRAVENOUS
  Filled 2015-03-02: qty 10

## 2015-03-02 MED ORDER — SODIUM CHLORIDE 0.9 % IV SOLN
Freq: Once | INTRAVENOUS | Status: AC
  Administered 2015-03-02: 12:00:00 via INTRAVENOUS
  Filled 2015-03-02: qty 5

## 2015-03-02 MED ORDER — SODIUM CHLORIDE 0.9 % IV SOLN
Freq: Once | INTRAVENOUS | Status: AC
  Administered 2015-03-02: 09:00:00 via INTRAVENOUS

## 2015-03-02 MED ORDER — SODIUM CHLORIDE 0.9 % IV SOLN
850.0000 mg/m2 | Freq: Once | INTRAVENOUS | Status: AC
  Administered 2015-03-02: 1330 mg via INTRAVENOUS
  Filled 2015-03-02: qty 34.98

## 2015-03-02 MED ORDER — HEPARIN SOD (PORK) LOCK FLUSH 100 UNIT/ML IV SOLN
500.0000 [IU] | Freq: Once | INTRAVENOUS | Status: AC | PRN
  Administered 2015-03-02: 500 [IU]
  Filled 2015-03-02: qty 5

## 2015-03-02 MED ORDER — POTASSIUM CHLORIDE 2 MEQ/ML IV SOLN
Freq: Once | INTRAVENOUS | Status: AC
  Administered 2015-03-02: 10:00:00 via INTRAVENOUS
  Filled 2015-03-02: qty 10

## 2015-03-02 MED ORDER — PALONOSETRON HCL INJECTION 0.25 MG/5ML
0.2500 mg | Freq: Once | INTRAVENOUS | Status: AC
  Administered 2015-03-02: 0.25 mg via INTRAVENOUS

## 2015-03-02 NOTE — Patient Instructions (Signed)

## 2015-03-02 NOTE — Patient Instructions (Signed)
[facility] Discharge Instructions for Patients Receiving Chemotherapy  Today you received the following chemotherapy agents:Gemzar, Cisplatin  To help prevent nausea and vomiting after your treatment, we encourage you to take your nausea medication as directed.   If you develop nausea and vomiting that is not controlled by your nausea medication, call the clinic.   BELOW ARE SYMPTOMS THAT SHOULD BE REPORTED IMMEDIATELY:  *FEVER GREATER THAN 100.5 F  *CHILLS WITH OR WITHOUT FEVER  NAUSEA AND VOMITING THAT IS NOT CONTROLLED WITH YOUR NAUSEA MEDICATION  *UNUSUAL SHORTNESS OF BREATH  *UNUSUAL BRUISING OR BLEEDING  TENDERNESS IN MOUTH AND THROAT WITH OR WITHOUT PRESENCE OF ULCERS  *URINARY PROBLEMS  *BOWEL PROBLEMS  UNUSUAL RASH Items with * indicate a potential emergency and should be followed up as soon as possible.  Feel free to call the clinic you have any questions or concerns. The clinic phone number is [phone].  Please show the [facility] at check-in to the Emergency Department and triage nurse.

## 2015-03-16 ENCOUNTER — Telehealth: Payer: Self-pay | Admitting: Hematology

## 2015-03-16 ENCOUNTER — Ambulatory Visit (HOSPITAL_BASED_OUTPATIENT_CLINIC_OR_DEPARTMENT_OTHER): Payer: Medicare HMO | Admitting: Hematology

## 2015-03-16 ENCOUNTER — Ambulatory Visit: Payer: Medicare HMO

## 2015-03-16 ENCOUNTER — Other Ambulatory Visit (HOSPITAL_BASED_OUTPATIENT_CLINIC_OR_DEPARTMENT_OTHER): Payer: Medicare HMO

## 2015-03-16 ENCOUNTER — Ambulatory Visit (HOSPITAL_BASED_OUTPATIENT_CLINIC_OR_DEPARTMENT_OTHER): Payer: Medicare HMO

## 2015-03-16 ENCOUNTER — Telehealth: Payer: Self-pay | Admitting: *Deleted

## 2015-03-16 ENCOUNTER — Encounter: Payer: Self-pay | Admitting: Hematology

## 2015-03-16 VITALS — BP 140/88 | HR 66 | Temp 98.2°F | Resp 18 | Ht 66.0 in | Wt 126.0 lb

## 2015-03-16 DIAGNOSIS — Z5111 Encounter for antineoplastic chemotherapy: Secondary | ICD-10-CM | POA: Diagnosis not present

## 2015-03-16 DIAGNOSIS — C787 Secondary malignant neoplasm of liver and intrahepatic bile duct: Secondary | ICD-10-CM

## 2015-03-16 DIAGNOSIS — C801 Malignant (primary) neoplasm, unspecified: Principal | ICD-10-CM

## 2015-03-16 DIAGNOSIS — Z95828 Presence of other vascular implants and grafts: Secondary | ICD-10-CM

## 2015-03-16 DIAGNOSIS — N390 Urinary tract infection, site not specified: Secondary | ICD-10-CM

## 2015-03-16 DIAGNOSIS — C799 Secondary malignant neoplasm of unspecified site: Secondary | ICD-10-CM

## 2015-03-16 DIAGNOSIS — N3000 Acute cystitis without hematuria: Secondary | ICD-10-CM

## 2015-03-16 LAB — URINALYSIS, MICROSCOPIC - CHCC
Bilirubin (Urine): NEGATIVE
Glucose: NEGATIVE mg/dL
Ketones: NEGATIVE mg/dL
Nitrite: NEGATIVE
PH: 5 (ref 4.6–8.0)
Protein: 100 mg/dL
Specific Gravity, Urine: 1.02 (ref 1.003–1.035)
Urobilinogen, UR: 0.2 mg/dL (ref 0.2–1)

## 2015-03-16 LAB — COMPREHENSIVE METABOLIC PANEL (CC13)
ALT: 16 U/L (ref 0–55)
AST: 16 U/L (ref 5–34)
Albumin: 3.5 g/dL (ref 3.5–5.0)
Alkaline Phosphatase: 69 U/L (ref 40–150)
Anion Gap: 7 mEq/L (ref 3–11)
BILIRUBIN TOTAL: 0.42 mg/dL (ref 0.20–1.20)
BUN: 14.6 mg/dL (ref 7.0–26.0)
CALCIUM: 9.1 mg/dL (ref 8.4–10.4)
CO2: 26 mEq/L (ref 22–29)
CREATININE: 0.9 mg/dL (ref 0.6–1.1)
Chloride: 109 mEq/L (ref 98–109)
EGFR: 72 mL/min/{1.73_m2} — ABNORMAL LOW (ref >90)
Glucose: 88 mg/dl (ref 70–140)
POTASSIUM: 4.1 meq/L (ref 3.5–5.1)
SODIUM: 142 meq/L (ref 136–145)
Total Protein: 6 g/dL — ABNORMAL LOW (ref 6.4–8.3)

## 2015-03-16 LAB — CBC WITH DIFFERENTIAL/PLATELET
BASO%: 0.3 % (ref 0.0–2.0)
BASOS ABS: 0 10*3/uL (ref 0.0–0.1)
EOS%: 0.6 % (ref 0.0–7.0)
Eosinophils Absolute: 0 10*3/uL (ref 0.0–0.5)
HCT: 25.6 % — ABNORMAL LOW (ref 34.8–46.6)
HGB: 8.5 g/dL — ABNORMAL LOW (ref 11.6–15.9)
LYMPH#: 1.3 10*3/uL (ref 0.9–3.3)
LYMPH%: 31.5 % (ref 14.0–49.7)
MCH: 34.6 pg — AB (ref 25.1–34.0)
MCHC: 33.1 g/dL (ref 31.5–36.0)
MCV: 104.6 fL — AB (ref 79.5–101.0)
MONO#: 0.6 10*3/uL (ref 0.1–0.9)
MONO%: 15 % — ABNORMAL HIGH (ref 0.0–14.0)
NEUT%: 52.6 % (ref 38.4–76.8)
NEUTROS ABS: 2.2 10*3/uL (ref 1.5–6.5)
Platelets: 110 10*3/uL — ABNORMAL LOW (ref 145–400)
RBC: 2.45 10*6/uL — ABNORMAL LOW (ref 3.70–5.45)
RDW: 16.6 % — ABNORMAL HIGH (ref 11.2–14.5)
WBC: 4.2 10*3/uL (ref 3.9–10.3)

## 2015-03-16 LAB — MAGNESIUM (CC13): Magnesium: 2.2 mg/dl (ref 1.5–2.5)

## 2015-03-16 MED ORDER — SODIUM CHLORIDE 0.9 % IV SOLN
800.0000 mg/m2 | Freq: Once | INTRAVENOUS | Status: AC
  Administered 2015-03-16: 1216 mg via INTRAVENOUS
  Filled 2015-03-16: qty 31.98

## 2015-03-16 MED ORDER — CIPROFLOXACIN HCL 500 MG PO TABS: 500.0000 mg | ORAL_TABLET | Freq: Two times a day (BID) | ORAL | Status: DC

## 2015-03-16 MED ORDER — SODIUM CHLORIDE 0.9 % IV SOLN
Freq: Once | INTRAVENOUS | Status: AC
  Administered 2015-03-16: 11:00:00 via INTRAVENOUS
  Filled 2015-03-16: qty 5

## 2015-03-16 MED ORDER — HEPARIN SOD (PORK) LOCK FLUSH 100 UNIT/ML IV SOLN
500.0000 [IU] | Freq: Once | INTRAVENOUS | Status: AC | PRN
  Administered 2015-03-16: 500 [IU]
  Filled 2015-03-16: qty 5

## 2015-03-16 MED ORDER — POTASSIUM CHLORIDE 2 MEQ/ML IV SOLN
Freq: Once | INTRAVENOUS | Status: AC
  Administered 2015-03-16: 09:00:00 via INTRAVENOUS
  Filled 2015-03-16: qty 10

## 2015-03-16 MED ORDER — SODIUM CHLORIDE 0.9 % IJ SOLN
10.0000 mL | INTRAMUSCULAR | Status: DC | PRN
  Administered 2015-03-16: 10 mL via INTRAVENOUS
  Filled 2015-03-16: qty 10

## 2015-03-16 MED ORDER — SODIUM CHLORIDE 0.9 % IV SOLN
Freq: Once | INTRAVENOUS | Status: AC
  Administered 2015-03-16: 09:00:00 via INTRAVENOUS

## 2015-03-16 MED ORDER — PALONOSETRON HCL INJECTION 0.25 MG/5ML
0.2500 mg | Freq: Once | INTRAVENOUS | Status: AC
  Administered 2015-03-16: 0.25 mg via INTRAVENOUS

## 2015-03-16 MED ORDER — MORPHINE SULFATE 15 MG PO TABS: 15.0000 mg | ORAL_TABLET | Freq: Two times a day (BID) | ORAL | Status: AC | PRN

## 2015-03-16 MED ORDER — SODIUM CHLORIDE 0.9 % IV SOLN
25.0000 mg/m2 | Freq: Once | INTRAVENOUS | Status: AC
  Administered 2015-03-16: 39 mg via INTRAVENOUS
  Filled 2015-03-16: qty 39

## 2015-03-16 MED ORDER — SODIUM CHLORIDE 0.9 % IJ SOLN
10.0000 mL | INTRAMUSCULAR | Status: DC | PRN
  Administered 2015-03-16: 10 mL
  Filled 2015-03-16: qty 10

## 2015-03-16 NOTE — Patient Instructions (Signed)

## 2015-03-16 NOTE — Patient Instructions (Signed)
[facility] Discharge Instructions for Patients Receiving Chemotherapy  Today you received the following chemotherapy agents Cisplatin/Gemzar  To help prevent nausea and vomiting after your treatment, we encourage you to take your nausea medication   If you develop nausea and vomiting that is not controlled by your nausea medication, call the clinic.   BELOW ARE SYMPTOMS THAT SHOULD BE REPORTED IMMEDIATELY:  *FEVER GREATER THAN 100.5 F  *CHILLS WITH OR WITHOUT FEVER  NAUSEA AND VOMITING THAT IS NOT CONTROLLED WITH YOUR NAUSEA MEDICATION  *UNUSUAL SHORTNESS OF BREATH  *UNUSUAL BRUISING OR BLEEDING  TENDERNESS IN MOUTH AND THROAT WITH OR WITHOUT PRESENCE OF ULCERS  *URINARY PROBLEMS  *BOWEL PROBLEMS  UNUSUAL RASH Items with * indicate a potential emergency and should be followed up as soon as possible.  Feel free to call the clinic you have any questions or concerns. The clinic phone number is [phone].  Please show the CHEMO ALERT CARD at check-in to the Emergency Department and triage nurse.   

## 2015-03-16 NOTE — Progress Notes (Signed)
Urine output = 400 cc @ 10:15

## 2015-03-16 NOTE — Progress Notes (Signed)
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 Hepatosplenomegaly. Abdominal and pelvic ascites and Pathologically enlarged upper abdominal. CT chest (-).    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 Bipolar 2. Hypothyroidism   CURRENT THERAPY: Cisplatin 25 mg/m, gemcitabine 1000 mg/m (changed to 8 50 mg/m from cycle 6 due to cytopenia), on day 1 and 8, every 21 days started on 10/10/2014  INTERVAL HISTORY: [name] returns for follow-up with her daughter. She is doing well overall. She has good appetite and eats well, she gained some weight. Energy level is decent, tolerates routine activity well. She complains mild dysuria for the past 2 weeks, no significant urinary frequency or hematuria. She has intermittent abdominal pain, takes morphine extended release 2-3 times a week, and oxycodone as needed. No nausea, bowel movements normal.   REVIEW OF SYSTEMS:   Constitutional: Denies fevers, chills, (+)  fatigued  Eyes: Denies blurriness of vision Ears, nose, mouth, throat, and face: Denies mucositis or sore throat Respiratory: Denies cough, dyspnea or wheezes Cardiovascular: Denies palpitation,  chest discomfort or lower extremity swelling Gastrointestinal:  no nausea, no heartburn or change in bowel habits Skin: Denies abnormal skin rashes.  Lymphatics: Denies new lymphadenopathy or easy bruising Neurological:Denies numbness, tingling or new weaknesses Behavioral/Psych: Mood is stable, no new changes  All other systems were reviewed with the patient and are negative.  I have reviewed the past medical history, past surgical history, social history and family history with the patient and they are unchanged from previous note.  ALLERGIES:  is allergic to buspar; cymbalta; hydroxyzine; and sertraline.  MEDICATIONS:  Current Outpatient Prescriptions  Medication Sig Dispense Refill  . amLODipine (NORVASC) 5 MG tablet Take 5 mg by mouth daily.    . benztropine (COGENTIN) 1 MG tablet Take 1 tablet (1 mg total) by mouth at bedtime. 90 tablet 2  . buPROPion (WELLBUTRIN XL) 300 MG 24 hr tablet Take 1 tablet (300 mg total) by mouth daily. 90 tablet 2  . lactulose (CHRONULAC) 10 GM/15ML solution 15 ml q 4 hr as needed for constipation.  Max of 60 ml per day (Patient not taking: Reported on 02/23/2015) 500 mL 2  . levothyroxine (SYNTHROID, LEVOTHROID) 88 MCG tablet Take 88 mcg by mouth at bedtime.     . lidocaine-prilocaine (EMLA) cream Apply 1 application topically as needed. Apply to port 1 hr before treatment as instructed. 30 g 0  . morphine (MSIR) 15 MG tablet Take 1 tablet (15 mg total) by mouth every 12 (twelve) hours as needed for moderate pain or severe pain. 30 tablet 0  . ondansetron (ZOFRAN) 4 MG tablet Take 1 tablet (4 mg total) by mouth 4 (four) times daily as needed for nausea or vomiting. (Patient not taking: Reported on 02/23/2015) 60 tablet  0  . oxyCODONE (OXY IR/ROXICODONE) 5 MG immediate release tablet Take 1 tablet (5 mg total) by mouth every 4 (four) hours as needed for severe pain. 120 tablet 0  . prochlorperazine (COMPAZINE) 10 MG tablet Take 1 tablet (10 mg total) by mouth  every 6 (six) hours as needed for nausea or vomiting. 30 tablet 1  . propranolol (INDERAL) 10 MG tablet Take 1 tablet (10 mg total) by mouth 3 (three) times daily. 270 tablet 2  . risperiDONE (RISPERDAL) 3 MG tablet Take 1 tablet (3 mg total) by mouth at bedtime. 90 tablet 2  . zolpidem (AMBIEN) 10 MG tablet Take 1 tablet (10 mg total) by mouth at bedtime as needed for sleep. 90 tablet 1   No current facility-administered medications for this visit.   Facility-Administered Medications Ordered in Other Visits  Medication Dose Route Frequency Provider Last Rate Last Dose  . sodium chloride 0.9 % injection 10 mL  10 mL Intravenous PRN [name], MD   10 mL at 03/16/15 0753  . Tbo-Filgrastim (GRANIX) injection 300 mcg  300 mcg Subcutaneous Once [name], MD        PHYSICAL EXAMINATION: ECOG PERFORMANCE STATUS:  Pain: mild intermittent abdominal pain   Filed Vitals:   03/16/15 0814  BP: 140/88  Pulse: 66  Temp: 98.2 F (36.8 C)  Resp: 18   Filed Weights   03/16/15 0814  Weight: 126 lb (57.153 kg)    GENERAL:alert, no distress and comfortable SKIN: skin color, texture, turgor are normal, no rashes or significant lesions EYES: normal, Conjunctiva are pink and non-injected, sclera clear OROPHARYNX:no exudate, no erythema and lips, buccal mucosa, and tongue normal  NECK: supple, thyroid normal size, non-tender, without nodularity LYMPH:  no palpable lymphadenopathy in the cervical, axillary or inguinal LUNGS: clear to auscultation and percussion with normal breathing effort HEART: regular rate & rhythm and no murmurs and no lower extremity edema ABDOMEN:abdomen soft,  and normal bowel sounds. Non-distended, (+) hepatomegaly has improved, about 6 cm below rib cage now  Musculoskeletal:no cyanosis of digits and no clubbing  NEURO: alert & oriented x 3 with fluent speech, no focal motor/sensory deficits  LABORATORY DATA:  I have reviewed the data as listed  CBC Latest Ref Rng 03/16/2015  03/02/2015 02/23/2015  WBC 3.9 - 10.3 10e3/uL 4.2 4.2 4.7  Hemoglobin 11.6 - 15.9 g/dL 8.5(L) 9.3(L) 9.8(L)  Hematocrit 34.8 - 46.6 % 25.6(L) 27.7(L) 29.1(L)  Platelets 145 - 400 10e3/uL 110(L) 139(L) 91(L)    CMP Latest Ref Rng 03/02/2015 02/23/2015 02/08/2015  Glucose 70 - 140 mg/dl 94 90 92  BUN 7.0 - 26.0 mg/dL 12.8 13.8 16.6  Creatinine 0.6 - 1.1 mg/dL 0.9 0.8 0.7  Sodium 136 - 145 mEq/L 140 142 142  Potassium 3.5 - 5.1 mEq/L 4.2 4.0 4.4  Chloride 96 - 112 mEq/L - - -  CO2 22 - 29 mEq/L 28 23 25   Calcium 8.4 - 10.4 mg/dL 9.3 9.1 8.8  Total Protein 6.4 - 8.3 g/dL 6.4 6.4 6.5  Total Bilirubin 0.20 - 1.20 mg/dL 0.33 0.45 0.42  Alkaline Phos 40 - 150 U/L 77 76 76  AST 5 - 34 U/L 25 20 30   ALT 0 - 55 U/L 41 23 52   CA 19.9 (Order [id number])      CA 19.9  Status: Finalresult Visible to patient:  MyChart Nextappt: Today at 09:30 AM in [facility]) Dx:  Metastatic adenocarcinoma  Ref Range 3wk ago  71mo ago  80mo ago     CA 19-9 <35.0 U/mL 1791.9 (H) 2683.3 (H)CM 5087.6 (H)CM          Surgical path 08/03/14 - ADENOCARCINOMA. Microscopic Comment The core biopsies are extensively involved by adenocarcinoma with focal goblet cells and foci with signet ring morphology. Immunohistochemistry will be performed and reported as an addendum. (JDP:gt, 08/04/14) Addendum: Immunohistochemistry is performed and the tumor is positive with Cytokeratin 7 and negative with Cytokeratin 20, CDX2, thyroid transcription factor-1, Napsin A, estrogen receptor, progesterone receptor and gross cystic disease fluid protein. The immunophenotype is nonspecific.  RADIOGRAPHIC STUDIES: I have personally reviewed the radiological images as listed and agreed with the findings in the report.  CT CHEST, ABDOMEN AND PELVIS IMPRESSION 01/03/2015 IMPRESSION: 1. Stable appearance of the small left supraclavicular lymph node. 2. Interval decrease in size of the  hepatic metastases. No new or progressive liver lesions are evident. 3. Necrotic portal caval lymph node shows no substantial interval change. 4. Persistent abnormal soft tissue attenuation encasing the celiac axis and SMA.   I have reviewed her EGD and colonoscopy reports which was done on 11/20, which was negative for ulceration or mass.   ASSESSMENT & PLAN:  54 yo female with PMH of bipolar but otherwise healthy, no history of liver disease or alcohol abuse, no history of blood transfusion, who presented with significant N/V, abdominal pain and bloating, New discovered multiple liver masses and small ascites, and elevated serum beta HCG, CA125 and CEA.   1. 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 -The patient and her family understand the overall poor prognosis, and her treatment goal is palliative - Her case was reviewed in our GI tumor Board. The radiologist feels her liver lesions are most consistent with metastatic lesions, not typical for cholangiocarcinoma, but pathology morphology favors cholangiocarcinoma or pancreatic cancer. CA19.9 57,711. 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  -I reviewed her restaging CT on 01/03/2015 which showed continue responding to treatment, no new lesions or ascites. She is clinically doing much better too.  -Tumor marker CA 19.9 significantly trending down, consistent with good response to chemotherapy -will continue chemo, planning for up to 8 cycles -Giving the significant cytopenia,  gemcitabine dose reduction by 15% from cycle 6 -Lab reviewed, we'll proceed cycle 8 day 1 treatment today. Mild thrombocytopenia, adequate for treatment.  2. Nausea and vomiting -resolved lately  -Likely related to chemotherapy. -I suggest her to use Zofran and Compazine alternatively, 20-30 minutes before each meal. -She knows to take nutrition  supplement, high-protein high calorie diet, and drink adequate fluids.  3. Anemia,  -related to chemotherapy and underline malignancy  -Improved after blood transfusion -Check this if hemoglobin less than 8 or symptomatic  4. Thrombocytopenia, secondary to chemotherapy -Gemcitabine dosed reduced, continue close monitoring  5. malignant ascites -She had paracentesis once --near resolved now, as part of tumor response to chemo    6. Malnutrition and deconditioning secondary to her probably underline malignancy  -much Improved  7. Bipolar -stable mood, continue meds  8. Abdominal pain -Improved and controlled. She is only taking morphine extensor release 15 mg at night and oxycodone as needed  9. URI? -We'll check UA and urine culture. -Cipro if  UA or culture positive.  Plan -Cycle 8 day 1 cisplatin and gemcitabine today -I refilled her morphine ER today  -restaging CT before next visit in 4  weeks    All questions were answered. The patient knows to call the clinic with any problems, questions or concerns. No barriers to learning was detected.  I spent 20 minutes counseling the patient face to face. The total time spent in the appointment was 25 minutes and more than 50% was on counseling and review of test results     [name], MD 03/16/2015

## 2015-03-16 NOTE — Telephone Encounter (Signed)
per pof to sch pt appt-gave pt copy of avs-adv [facility] will call to sch scan-gave pt contrast

## 2015-03-16 NOTE — Telephone Encounter (Signed)
Called & spoke to pt's husband & informed that RX for cipro will be called to her pharmacy for UTI.

## 2015-03-17 LAB — CANCER ANTIGEN 19-9: CA 19-9: 1064.1 U/mL — ABNORMAL HIGH (ref <35.0)

## 2015-03-19 LAB — URINE CULTURE

## 2015-03-21 ENCOUNTER — Ambulatory Visit (INDEPENDENT_AMBULATORY_CARE_PROVIDER_SITE_OTHER): Payer: Medicare HMO | Admitting: Psychiatry

## 2015-03-21 ENCOUNTER — Encounter (HOSPITAL_COMMUNITY): Payer: Self-pay | Admitting: Psychiatry

## 2015-03-21 VITALS — BP 163/106 | HR 76 | Ht 66.0 in | Wt 124.0 lb

## 2015-03-21 DIAGNOSIS — F410 Panic disorder [episodic paroxysmal anxiety] without agoraphobia: Secondary | ICD-10-CM | POA: Diagnosis not present

## 2015-03-21 DIAGNOSIS — F29 Unspecified psychosis not due to a substance or known physiological condition: Secondary | ICD-10-CM | POA: Diagnosis not present

## 2015-03-21 DIAGNOSIS — F319 Bipolar disorder, unspecified: Secondary | ICD-10-CM | POA: Diagnosis not present

## 2015-03-21 MED ORDER — RISPERIDONE 3 MG PO TABS: 3.0000 mg | ORAL_TABLET | Freq: Every day | ORAL | Status: DC

## 2015-03-21 MED ORDER — RISPERIDONE 3 MG PO TABS: 3.0000 mg | ORAL_TABLET | Freq: Every day | ORAL | Status: AC

## 2015-03-21 MED ORDER — BENZTROPINE MESYLATE 1 MG PO TABS: 1.0000 mg | ORAL_TABLET | Freq: Every day | ORAL | Status: DC

## 2015-03-21 MED ORDER — ZOLPIDEM TARTRATE 10 MG PO TABS: 10.0000 mg | ORAL_TABLET | Freq: Every evening | ORAL | Status: AC | PRN

## 2015-03-21 MED ORDER — PROPRANOLOL HCL 10 MG PO TABS: 10.0000 mg | ORAL_TABLET | Freq: Three times a day (TID) | ORAL | Status: DC

## 2015-03-21 MED ORDER — BENZTROPINE MESYLATE 1 MG PO TABS: 1.0000 mg | ORAL_TABLET | Freq: Every day | ORAL | Status: AC

## 2015-03-21 MED ORDER — PROPRANOLOL HCL 10 MG PO TABS: 10.0000 mg | ORAL_TABLET | Freq: Three times a day (TID) | ORAL | Status: AC

## 2015-03-21 MED ORDER — BUPROPION HCL ER (XL) 300 MG PO TB24: 300.0000 mg | ORAL_TABLET | Freq: Every day | ORAL | Status: AC
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
## 2015-03-23 ENCOUNTER — Ambulatory Visit: Payer: Medicare HMO

## 2015-03-23 ENCOUNTER — Other Ambulatory Visit (HOSPITAL_BASED_OUTPATIENT_CLINIC_OR_DEPARTMENT_OTHER): Payer: Medicare HMO

## 2015-03-23 ENCOUNTER — Ambulatory Visit (HOSPITAL_BASED_OUTPATIENT_CLINIC_OR_DEPARTMENT_OTHER): Payer: Medicare HMO

## 2015-03-23 ENCOUNTER — Ambulatory Visit: Payer: Self-pay

## 2015-03-23 ENCOUNTER — Other Ambulatory Visit: Payer: Self-pay | Admitting: Hematology and Oncology

## 2015-03-23 ENCOUNTER — Ambulatory Visit: Payer: Self-pay | Admitting: Nurse Practitioner

## 2015-03-23 VITALS — BP 141/82 | HR 74 | Temp 98.1°F | Resp 16

## 2015-03-23 DIAGNOSIS — C801 Malignant (primary) neoplasm, unspecified: Secondary | ICD-10-CM | POA: Diagnosis not present

## 2015-03-23 DIAGNOSIS — D630 Anemia in neoplastic disease: Secondary | ICD-10-CM | POA: Diagnosis not present

## 2015-03-23 DIAGNOSIS — D6481 Anemia due to antineoplastic chemotherapy: Secondary | ICD-10-CM | POA: Diagnosis not present

## 2015-03-23 DIAGNOSIS — Z5111 Encounter for antineoplastic chemotherapy: Secondary | ICD-10-CM | POA: Diagnosis not present

## 2015-03-23 DIAGNOSIS — C787 Secondary malignant neoplasm of liver and intrahepatic bile duct: Secondary | ICD-10-CM

## 2015-03-23 DIAGNOSIS — Z95828 Presence of other vascular implants and grafts: Secondary | ICD-10-CM

## 2015-03-23 LAB — COMPREHENSIVE METABOLIC PANEL (CC13)
ALT: 24 U/L (ref 0–55)
ANION GAP: 11 meq/L (ref 3–11)
AST: 23 U/L (ref 5–34)
Albumin: 3.6 g/dL (ref 3.5–5.0)
Alkaline Phosphatase: 71 U/L (ref 40–150)
BILIRUBIN TOTAL: 0.44 mg/dL (ref 0.20–1.20)
BUN: 22 mg/dL (ref 7.0–26.0)
CO2: 23 meq/L (ref 22–29)
Calcium: 9.5 mg/dL (ref 8.4–10.4)
Chloride: 106 mEq/L (ref 98–109)
Creatinine: 1.2 mg/dL — ABNORMAL HIGH (ref 0.6–1.1)
EGFR: 52 mL/min/{1.73_m2} — ABNORMAL LOW (ref >90)
Glucose: 91 mg/dl (ref 70–140)
POTASSIUM: 4 meq/L (ref 3.5–5.1)
Sodium: 140 mEq/L (ref 136–145)
TOTAL PROTEIN: 6.2 g/dL — AB (ref 6.4–8.3)

## 2015-03-23 LAB — CBC WITH DIFFERENTIAL/PLATELET
BASO%: 0.3 % (ref 0.0–2.0)
Basophils Absolute: 0 10*3/uL (ref 0.0–0.1)
EOS%: 0.3 % (ref 0.0–7.0)
Eosinophils Absolute: 0 10*3/uL (ref 0.0–0.5)
HEMATOCRIT: 24.8 % — AB (ref 34.8–46.6)
HGB: 8.2 g/dL — ABNORMAL LOW (ref 11.6–15.9)
LYMPH#: 1.8 10*3/uL (ref 0.9–3.3)
LYMPH%: 45.6 % (ref 14.0–49.7)
MCH: 34.3 pg — AB (ref 25.1–34.0)
MCHC: 33.1 g/dL (ref 31.5–36.0)
MCV: 103.8 fL — ABNORMAL HIGH (ref 79.5–101.0)
MONO#: 0.3 10*3/uL (ref 0.1–0.9)
MONO%: 8.5 % (ref 0.0–14.0)
NEUT#: 1.8 10*3/uL (ref 1.5–6.5)
NEUT%: 45.3 % (ref 38.4–76.8)
Platelets: 139 10*3/uL — ABNORMAL LOW (ref 145–400)
RBC: 2.39 10*6/uL — ABNORMAL LOW (ref 3.70–5.45)
RDW: 15.1 % — ABNORMAL HIGH (ref 11.2–14.5)
WBC: 4 10*3/uL (ref 3.9–10.3)

## 2015-03-23 MED ORDER — SODIUM CHLORIDE 0.9 % IJ SOLN
10.0000 mL | INTRAMUSCULAR | Status: DC | PRN
  Administered 2015-03-23: 10 mL
  Filled 2015-03-23: qty 10

## 2015-03-23 MED ORDER — SODIUM CHLORIDE 0.9 % IV SOLN
800.0000 mg/m2 | Freq: Once | INTRAVENOUS | Status: AC
  Administered 2015-03-23: 1254 mg via INTRAVENOUS
  Filled 2015-03-23: qty 32.98

## 2015-03-23 MED ORDER — SODIUM CHLORIDE 0.9 % IV SOLN
25.0000 mg/m2 | Freq: Once | INTRAVENOUS | Status: AC
  Administered 2015-03-23: 39 mg via INTRAVENOUS
  Filled 2015-03-23: qty 39

## 2015-03-23 MED ORDER — SODIUM CHLORIDE 0.9 % IJ SOLN
10.0000 mL | INTRAMUSCULAR | Status: DC | PRN
  Administered 2015-03-23: 10 mL via INTRAVENOUS
  Filled 2015-03-23: qty 10

## 2015-03-23 MED ORDER — PALONOSETRON HCL INJECTION 0.25 MG/5ML
0.2500 mg | Freq: Once | INTRAVENOUS | Status: AC
  Administered 2015-03-23: 0.25 mg via INTRAVENOUS

## 2015-03-23 MED ORDER — POTASSIUM CHLORIDE 2 MEQ/ML IV SOLN
Freq: Once | INTRAVENOUS | Status: AC
  Administered 2015-03-23: 10:00:00 via INTRAVENOUS
  Filled 2015-03-23: qty 10

## 2015-03-23 MED ORDER — SODIUM CHLORIDE 0.9 % IV SOLN
Freq: Once | INTRAVENOUS | Status: AC
  Administered 2015-03-23: 12:00:00 via INTRAVENOUS
  Filled 2015-03-23: qty 5

## 2015-03-23 MED ORDER — LORAZEPAM 0.5 MG PO TABS: 0.5000 mg | ORAL_TABLET | Freq: Four times a day (QID) | ORAL | Status: DC | PRN

## 2015-03-23 MED ORDER — HEPARIN SOD (PORK) LOCK FLUSH 100 UNIT/ML IV SOLN
500.0000 [IU] | Freq: Once | INTRAVENOUS | Status: AC | PRN
  Administered 2015-03-23: 500 [IU]
  Filled 2015-03-23: qty 5

## 2015-03-23 MED ORDER — PALONOSETRON HCL INJECTION 0.25 MG/5ML
INTRAVENOUS | Status: AC
  Filled 2015-03-23: qty 5

## 2015-03-23 MED ORDER — SODIUM CHLORIDE 0.9 % IV SOLN
Freq: Once | INTRAVENOUS | Status: AC
  Administered 2015-03-23: 10:00:00 via INTRAVENOUS

## 2015-03-23 NOTE — Patient Instructions (Signed)
[facility] Discharge Instructions for Patients Receiving Chemotherapy  Today you received the following chemotherapy agents: gemzar, cisplatin  To help prevent nausea and vomiting after your treatment, we encourage you to take your nausea medication.  Take it as often as prescribed.     If you develop nausea and vomiting that is not controlled by your nausea medication, call the clinic. If it is after clinic hours your family physician or the after hours number for the clinic or go to the Emergency Department.   BELOW ARE SYMPTOMS THAT SHOULD BE REPORTED IMMEDIATELY:  *FEVER GREATER THAN 100.5 F  *CHILLS WITH OR WITHOUT FEVER  NAUSEA AND VOMITING THAT IS NOT CONTROLLED WITH YOUR NAUSEA MEDICATION  *UNUSUAL SHORTNESS OF BREATH  *UNUSUAL BRUISING OR BLEEDING  TENDERNESS IN MOUTH AND THROAT WITH OR WITHOUT PRESENCE OF ULCERS  *URINARY PROBLEMS  *BOWEL PROBLEMS  UNUSUAL RASH Items with * indicate a potential emergency and should be followed up as soon as possible.  Feel free to call the clinic you have any questions or concerns. The clinic phone number is [phone].   I have been informed and understand all the instructions given to me. I know to contact the clinic, my physician, or go to the Emergency Department if any problems should occur. I do not have any questions at this time, but understand that I may call the clinic during office hours   should I have any questions or need assistance in obtaining follow up care.    __________________________________________  _____________  __________ Signature of Patient or Authorized Representative            Date                   Time    __________________________________________ Nurse's Signature

## 2015-03-23 NOTE — Patient Instructions (Signed)

## 2015-04-05 ENCOUNTER — Encounter (HOSPITAL_COMMUNITY): Payer: Self-pay

## 2015-04-05 ENCOUNTER — Ambulatory Visit (HOSPITAL_COMMUNITY)
Admission: RE | Admit: 2015-04-05 | Discharge: 2015-04-05 | Disposition: A | Discharge status: Home / Self Care | Payer: Medicare HMO | Source: Ambulatory Visit | Attending: Hematology | Admitting: Hematology

## 2015-04-05 DIAGNOSIS — C227 Other specified carcinomas of liver: Secondary | ICD-10-CM | POA: Insufficient documentation

## 2015-04-05 DIAGNOSIS — C787 Secondary malignant neoplasm of liver and intrahepatic bile duct: Secondary | ICD-10-CM

## 2015-04-05 DIAGNOSIS — C801 Malignant (primary) neoplasm, unspecified: Secondary | ICD-10-CM

## 2015-04-05 MED ORDER — IOHEXOL 300 MG/ML  SOLN
100.0000 mL | Freq: Once | INTRAMUSCULAR | Status: AC | PRN
  Administered 2015-04-05: 100 mL via INTRAVENOUS

## 2015-04-13 ENCOUNTER — Other Ambulatory Visit (HOSPITAL_BASED_OUTPATIENT_CLINIC_OR_DEPARTMENT_OTHER): Payer: Medicare HMO

## 2015-04-13 ENCOUNTER — Encounter: Payer: Self-pay | Admitting: Hematology

## 2015-04-13 ENCOUNTER — Ambulatory Visit (HOSPITAL_BASED_OUTPATIENT_CLINIC_OR_DEPARTMENT_OTHER): Payer: Medicare HMO | Admitting: Hematology

## 2015-04-13 ENCOUNTER — Telehealth: Payer: Self-pay | Admitting: Hematology

## 2015-04-13 VITALS — BP 167/91 | HR 67 | Temp 97.9°F | Resp 18 | Ht 66.0 in | Wt 126.7 lb

## 2015-04-13 DIAGNOSIS — C787 Secondary malignant neoplasm of liver and intrahepatic bile duct: Secondary | ICD-10-CM | POA: Diagnosis not present

## 2015-04-13 DIAGNOSIS — D6481 Anemia due to antineoplastic chemotherapy: Secondary | ICD-10-CM

## 2015-04-13 DIAGNOSIS — C799 Secondary malignant neoplasm of unspecified site: Secondary | ICD-10-CM

## 2015-04-13 DIAGNOSIS — R180 Malignant ascites: Secondary | ICD-10-CM

## 2015-04-13 DIAGNOSIS — C801 Malignant (primary) neoplasm, unspecified: Secondary | ICD-10-CM

## 2015-04-13 DIAGNOSIS — E46 Unspecified protein-calorie malnutrition: Secondary | ICD-10-CM

## 2015-04-13 LAB — CBC WITH DIFFERENTIAL/PLATELET
BASO%: 0.7 % (ref 0.0–2.0)
BASOS ABS: 0 10*3/uL (ref 0.0–0.1)
EOS ABS: 0.1 10*3/uL (ref 0.0–0.5)
EOS%: 1.3 % (ref 0.0–7.0)
HEMATOCRIT: 29.3 % — AB (ref 34.8–46.6)
HEMOGLOBIN: 9.7 g/dL — AB (ref 11.6–15.9)
LYMPH%: 31.2 % (ref 14.0–49.7)
MCH: 34.7 pg — ABNORMAL HIGH (ref 25.1–34.0)
MCHC: 33 g/dL (ref 31.5–36.0)
MCV: 105.3 fL — AB (ref 79.5–101.0)
MONO#: 0.7 10*3/uL (ref 0.1–0.9)
MONO%: 16.9 % — AB (ref 0.0–14.0)
NEUT#: 2.1 10*3/uL (ref 1.5–6.5)
NEUT%: 49.9 % (ref 38.4–76.8)
Platelets: 191 10*3/uL (ref 145–400)
RBC: 2.78 10*6/uL — AB (ref 3.70–5.45)
RDW: 16.4 % — AB (ref 11.2–14.5)
WBC: 4.3 10*3/uL (ref 3.9–10.3)
lymph#: 1.3 10*3/uL (ref 0.9–3.3)

## 2015-04-13 LAB — COMPREHENSIVE METABOLIC PANEL (CC13)
ALK PHOS: 84 U/L (ref 40–150)
ALT: 12 U/L (ref 0–55)
AST: 17 U/L (ref 5–34)
Albumin: 3.9 g/dL (ref 3.5–5.0)
Anion Gap: 5 mEq/L (ref 3–11)
BUN: 20.2 mg/dL (ref 7.0–26.0)
CHLORIDE: 110 meq/L — AB (ref 98–109)
CO2: 27 mEq/L (ref 22–29)
Calcium: 9.5 mg/dL (ref 8.4–10.4)
Creatinine: 1.1 mg/dL (ref 0.6–1.1)
EGFR: 61 mL/min/{1.73_m2} — ABNORMAL LOW (ref >90)
GLUCOSE: 95 mg/dL (ref 70–140)
Potassium: 4.2 mEq/L (ref 3.5–5.1)
SODIUM: 142 meq/L (ref 136–145)
TOTAL PROTEIN: 6.8 g/dL (ref 6.4–8.3)
Total Bilirubin: 0.69 mg/dL (ref 0.20–1.20)

## 2015-04-13 LAB — MAGNESIUM (CC13): MAGNESIUM: 2 mg/dL (ref 1.5–2.5)

## 2015-04-13 MED ORDER — PROCHLORPERAZINE MALEATE 10 MG PO TABS: 10.0000 mg | ORAL_TABLET | Freq: Four times a day (QID) | ORAL | Status: DC | PRN

## 2015-04-13 NOTE — Progress Notes (Signed)
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 Hepatosplenomegaly. Abdominal and pelvic ascites and Pathologically enlarged upper abdominal. CT chest (-).    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    11/16/2014 Imaging great partical response, marked decrase ascites    01/03/2015 Imaging decrased liver lesion, no ascites    04/05/2015 Imaging interval enlargement of a portacaval node (from 3.1 to 4.2), and a mild enlarged aortocaval node, stable liver lesions, interval development of extensive endometrial thickening, and a pancreatic cyst, no other new lesions.     OTHER MEDICAL PROBLEMS: 1. Bipolar 2. Hypothyroidism   CURRENT THERAPY: Observation  INTERVAL HISTORY: [name] returns for follow-up with her daughter.  She is doing quite well, has good appetite and energy level, tolerating routine activity without any difficulty. She is quite active at home. She has occasional abdominal pain, takes morphine and oxycodone once in a few weeks. No nausea, change of her bowel movement, dyspnea, or other new complaints.  REVIEW OF SYSTEMS:   Constitutional: Denies fevers, chills, (+)  fatigued  Eyes: Denies blurriness of vision Ears, nose, mouth, throat,  and face: Denies mucositis or sore throat Respiratory: Denies cough, dyspnea or wheezes Cardiovascular: Denies palpitation, chest discomfort or lower extremity swelling Gastrointestinal:  no nausea, no heartburn or change in bowel habits Skin: Denies abnormal skin rashes.  Lymphatics: Denies new lymphadenopathy or easy bruising Neurological:Denies numbness, tingling or new weaknesses Behavioral/Psych: Mood is stable, no new changes  All other systems were reviewed with the patient and are negative.  I have reviewed the past medical history, past surgical history, social history and family history with the patient and they are unchanged from previous note.  ALLERGIES:  is allergic to buspar; cymbalta; hydroxyzine; and sertraline.  MEDICATIONS:  Current Outpatient Prescriptions  Medication Sig Dispense Refill  . amLODipine (NORVASC) 5 MG tablet Take 5 mg by mouth daily.    . benztropine (COGENTIN) 1 MG tablet Take 1 tablet (1 mg total) by mouth at bedtime. 90 tablet 2  . buPROPion (WELLBUTRIN XL) 300 MG 24 hr tablet Take 1 tablet (300 mg total) by mouth daily. 90 tablet 2  . lactulose (CHRONULAC) 10 GM/15ML solution 15 ml q 4 hr as needed for constipation.  Max of 60 ml per day 500 mL 2  . levothyroxine (SYNTHROID, LEVOTHROID) 88 MCG tablet Take 88 mcg by mouth at bedtime.     . lidocaine-prilocaine (EMLA) cream Apply 1 application topically as needed. Apply to port 1 hr before treatment as instructed. 30 g 0  . morphine (MSIR) 15 MG tablet Take 1 tablet (15 mg total) by mouth every 12 (twelve) hours as needed for moderate pain or severe pain. 30 tablet 0  . ondansetron (ZOFRAN) 4 MG tablet Take 1 tablet (4 mg total) by mouth 4 (four) times daily as needed  for nausea or vomiting. 60 tablet 0  . prochlorperazine (COMPAZINE) 10 MG tablet Take 1 tablet (10 mg total) by mouth every 6 (six) hours as needed for nausea or vomiting. 30 tablet 3  . propranolol (INDERAL) 10 MG tablet Take 1 tablet (10  mg total) by mouth 3 (three) times daily. 270 tablet 2  . risperiDONE (RISPERDAL) 3 MG tablet Take 1 tablet (3 mg total) by mouth at bedtime. 90 tablet 2  . zolpidem (AMBIEN) 10 MG tablet Take 1 tablet (10 mg total) by mouth at bedtime as needed for sleep. 90 tablet 1  . oxyCODONE (OXY IR/ROXICODONE) 5 MG immediate release tablet Take 1 tablet (5 mg total) by mouth every 4 (four) hours as needed for severe pain. (Patient not taking: Reported on 04/13/2015) 120 tablet 0   No current facility-administered medications for this visit.   Facility-Administered Medications Ordered in Other Visits  Medication Dose Route Frequency Provider Last Rate Last Dose  . Tbo-Filgrastim (GRANIX) injection 300 mcg  300 mcg Subcutaneous Once [name], MD        PHYSICAL EXAMINATION: ECOG PERFORMANCE STATUS:  Pain: mild intermittent abdominal pain   Filed Vitals:   04/13/15 1044  BP: 167/91  Pulse: 67  Temp: 97.9 F (36.6 C)  Resp: 18   Filed Weights   04/13/15 1044  Weight: 126 lb 11.2 oz (57.471 kg)    GENERAL:alert, no distress and comfortable SKIN: skin color, texture, turgor are normal, no rashes or significant lesions EYES: normal, Conjunctiva are pink and non-injected, sclera clear OROPHARYNX:no exudate, no erythema and lips, buccal mucosa, and tongue normal  NECK: supple, thyroid normal size, non-tender, without nodularity LYMPH:  no palpable lymphadenopathy in the cervical, axillary or inguinal LUNGS: clear to auscultation and percussion with normal breathing effort HEART: regular rate & rhythm and no murmurs and no lower extremity edema ABDOMEN:abdomen soft,  and normal bowel sounds. Non-distended, (+) hepatomegaly has improved, about 6 cm below rib cage now  Musculoskeletal:no cyanosis of digits and no clubbing  NEURO: alert & oriented x 3 with fluent speech, no focal motor/sensory deficits  LABORATORY DATA:  I have reviewed the data as listed  CBC Latest Ref Rng 04/13/2015 03/23/2015  03/16/2015  WBC 3.9 - 10.3 10e3/uL 4.3 4.0 4.2  Hemoglobin 11.6 - 15.9 g/dL 9.7(L) 8.2(L) 8.5(L)  Hematocrit 34.8 - 46.6 % 29.3(L) 24.8(L) 25.6(L)  Platelets 145 - 400 10e3/uL 191 139(L) 110(L)    CMP Latest Ref Rng 04/13/2015 03/23/2015 03/16/2015  Glucose 70 - 140 mg/dl 95 91 88  BUN 7.0 - 26.0 mg/dL 20.2 22.0 14.6  Creatinine 0.6 - 1.1 mg/dL 1.1 1.2(H) 0.9  Sodium 136 - 145 mEq/L 142 140 142  Potassium 3.5 - 5.1 mEq/L 4.2 4.0 4.1  Chloride 96 - 112 mEq/L - - -  CO2 22 - 29 mEq/L 27 23 26   Calcium 8.4 - 10.4 mg/dL 9.5 9.5 9.1  Total Protein 6.4 - 8.3 g/dL 6.8 6.2(L) 6.0(L)  Total Bilirubin 0.20 - 1.20 mg/dL 0.69 0.44 0.42  Alkaline Phos 40 - 150 U/L 84 71 69  AST 5 - 34 U/L 17 23 16   ALT 0 - 55 U/L 12 24 16    CA 19.9  Status: Finalresult Visible to patient:  MyChart Nextappt: 05/04/2015 at 12:15 PM in [facility] 4) Dx:  Metastatic adenocarcinoma              Ref Range 4wk ago  39mo ago  62mo ago  CA 19-9 <35.0 U/mL 1064.1 (H) 1791.9 (H)CM 2683.3 (H)CM           Surgical path 08/03/14 - ADENOCARCINOMA. Microscopic Comment The core biopsies are extensively involved by adenocarcinoma with focal goblet cells and foci with signet ring morphology. Immunohistochemistry will be performed and reported as an addendum. (JDP:gt, 08/04/14) Addendum: Immunohistochemistry is performed and the tumor is positive with Cytokeratin 7 and negative with Cytokeratin 20, CDX2, thyroid transcription factor-1, Napsin A, estrogen receptor, progesterone receptor and gross cystic disease fluid protein. The immunophenotype is nonspecific.  RADIOGRAPHIC STUDIES: I have personally reviewed the radiological images as listed and agreed with the findings in the report.  CT CHEST, ABDOMEN AND PELVIS IMPRESSION 01/03/2015 IMPRESSION: 1. Stable appearance of the small left supraclavicular lymph node. 2. Interval decrease in size of the hepatic metastases. No new  or progressive liver lesions are evident. 3. Necrotic portal caval lymph node shows no substantial interval change. 4. Persistent abnormal soft tissue attenuation encasing the celiac axis and SMA.  CT chest, abdomen and pelvis with contrast 04/05/2015 IMPRESSION: 1. Interval enlargement of a very large portacaval lymph node, suggesting progression of nodal metastasis. There is also enlargement of a mildly enlarged aortocaval lymph node. Previously noted liver lesions appear stable in size. No new sites of metastatic disease are otherwise noted. 2. Interval development of extensive endometrial thickening. Correlation with pelvic ultrasound is recommended to exclude the possibility of endometrial neoplasm. 3. Additional incidental findings, similar prior studies, as above.   I have reviewed her EGD and colonoscopy reports which was done on 11/20, which was negative for ulceration or mass.   ASSESSMENT & PLAN:  54 yo female with PMH of bipolar but otherwise healthy, no history of liver disease or alcohol abuse, no history of blood transfusion, who presented with significant N/V, abdominal pain and bloating, New discovered multiple liver masses and small ascites, and elevated serum beta HCG, CA125 and CEA.   1. 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 -The patient and her family understand the overall poor prognosis, and her treatment goal is palliative - Her case was reviewed in our GI tumor Board. The radiologist feels her liver lesions are most consistent with metastatic lesions, not typical for cholangiocarcinoma, but pathology morphology favors cholangiocarcinoma or pancreatic cancer. CA19.9 57,711. 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  -I reviewed her restaging CT on 01/03/2015 which showed continue responding to treatment, no new lesions or ascites. She is  clinically doing much better too.  -Her restaging scan from July 21 showed stable liver disease, interval increase in the size of the portcaval lymph node, no other new disease. Overall by measurements, she has stable disease. -Tumor marker CA 19.9 continues trending down (today's still pending), consistent with good response to chemotherapy -will continue chemo, planning for up to 8 cycles -I discussed the option of continued observation/chemotherapy break, versus starting second line chemotherapy. Given her overall stable disease on the CT scan, clinical asymptomatic , she also wished to have a longer break, we decided to continue observation for now  -We discussed that her tumor is likely going to grow again at some point, and I would recommend second line chemotherapy FOLFOX -Return to clinic in 3 weeks for lab including CA-19-9  -If she clinically doing well, we'll repeat scan in 6-8 weeks   2. Abdominal pain -overall much improved, minimal pain lately, continue pain medication as needed  3. Anemia,  -  related to chemotherapy and underline malignancy  -Improved after blood transfusion -Check this if hemoglobin less than 8 or symptomatic  4. malignant ascites -She had paracentesis once --near resolved now, as part of tumor response to chemo   5. Malnutrition and deconditioning secondary to her probably underline malignancy  -much Improved  6. Bipolar -stable mood, continue meds   Plan -Continue observation/chemotherapy break -Return to clinic in 3 weeks with lab  All questions were answered. The patient knows to call the clinic with any problems, questions or concerns. No barriers to learning was detected.  I spent 20 minutes counseling the patient face to face. The total time spent in the appointment was 25 minutes and more than 50% was on counseling and review of test results     [name], MD 04/13/2015

## 2015-04-13 NOTE — Telephone Encounter (Signed)
per pof to sch pt appt-gave pt copy of avs °

## 2015-04-14 LAB — CANCER ANTIGEN 19-9: CA 19 9: 1282 U/mL — AB (ref <35.0)

## 2015-04-16 ENCOUNTER — Telehealth: Payer: Self-pay | Admitting: *Deleted

## 2015-04-16 NOTE — Telephone Encounter (Signed)
Oncology Nurse Navigator Documentation  Oncology Nurse Navigator Flowsheets 04/16/2015  Navigator Encounter Type Telephone  Treatment Phase Treatment "holiday"  Barriers/Navigation Needs No barriers at this time  Interventions Coordination of Care  Coordination of Care Needs port flush at next visit-POF to scheduler  Time Spent with Patient 5  Denies any needs at this time. Feeling well.

## 2015-04-18 ENCOUNTER — Other Ambulatory Visit: Payer: Self-pay | Admitting: Physician Assistant

## 2015-04-23 NOTE — Telephone Encounter (Signed)
Have you addressed this ? It is still open

## 2015-04-26 ENCOUNTER — Other Ambulatory Visit: Payer: Self-pay | Admitting: *Deleted

## 2015-05-03 ENCOUNTER — Telehealth: Payer: Self-pay | Admitting: Hematology

## 2015-05-03 ENCOUNTER — Other Ambulatory Visit: Payer: Self-pay | Admitting: *Deleted

## 2015-05-03 DIAGNOSIS — C801 Malignant (primary) neoplasm, unspecified: Principal | ICD-10-CM

## 2015-05-03 DIAGNOSIS — C787 Secondary malignant neoplasm of liver and intrahepatic bile duct: Secondary | ICD-10-CM

## 2015-05-03 NOTE — Telephone Encounter (Signed)
Moved 8/19 appointments to AM per YF - aware of GI clinic. Spoke with patient she is aware.

## 2015-05-04 ENCOUNTER — Other Ambulatory Visit (HOSPITAL_BASED_OUTPATIENT_CLINIC_OR_DEPARTMENT_OTHER): Payer: Medicare HMO

## 2015-05-04 ENCOUNTER — Telehealth: Payer: Self-pay | Admitting: Hematology

## 2015-05-04 ENCOUNTER — Ambulatory Visit (HOSPITAL_BASED_OUTPATIENT_CLINIC_OR_DEPARTMENT_OTHER): Payer: Medicare HMO

## 2015-05-04 ENCOUNTER — Encounter: Payer: Self-pay | Admitting: Hematology

## 2015-05-04 ENCOUNTER — Ambulatory Visit (HOSPITAL_BASED_OUTPATIENT_CLINIC_OR_DEPARTMENT_OTHER): Payer: Medicare HMO | Admitting: Hematology

## 2015-05-04 DIAGNOSIS — C801 Malignant (primary) neoplasm, unspecified: Secondary | ICD-10-CM

## 2015-05-04 DIAGNOSIS — C799 Secondary malignant neoplasm of unspecified site: Secondary | ICD-10-CM

## 2015-05-04 DIAGNOSIS — R180 Malignant ascites: Secondary | ICD-10-CM

## 2015-05-04 DIAGNOSIS — D630 Anemia in neoplastic disease: Secondary | ICD-10-CM

## 2015-05-04 DIAGNOSIS — Z452 Encounter for adjustment and management of vascular access device: Secondary | ICD-10-CM | POA: Diagnosis not present

## 2015-05-04 DIAGNOSIS — C787 Secondary malignant neoplasm of liver and intrahepatic bile duct: Secondary | ICD-10-CM

## 2015-05-04 DIAGNOSIS — R109 Unspecified abdominal pain: Secondary | ICD-10-CM

## 2015-05-04 DIAGNOSIS — D6481 Anemia due to antineoplastic chemotherapy: Secondary | ICD-10-CM | POA: Diagnosis not present

## 2015-05-04 DIAGNOSIS — F319 Bipolar disorder, unspecified: Secondary | ICD-10-CM

## 2015-05-04 LAB — COMPREHENSIVE METABOLIC PANEL (CC13)
ALT: 10 U/L (ref 0–55)
ANION GAP: 10 meq/L (ref 3–11)
AST: 13 U/L (ref 5–34)
Albumin: 3.6 g/dL (ref 3.5–5.0)
Alkaline Phosphatase: 85 U/L (ref 40–150)
BUN: 13 mg/dL (ref 7.0–26.0)
CHLORIDE: 108 meq/L (ref 98–109)
CO2: 24 mEq/L (ref 22–29)
Calcium: 9.5 mg/dL (ref 8.4–10.4)
Creatinine: 0.9 mg/dL (ref 0.6–1.1)
EGFR: 72 mL/min/{1.73_m2} — ABNORMAL LOW (ref >90)
Glucose: 98 mg/dl (ref 70–140)
POTASSIUM: 3.6 meq/L (ref 3.5–5.1)
Sodium: 143 mEq/L (ref 136–145)
Total Bilirubin: 0.97 mg/dL (ref 0.20–1.20)
Total Protein: 6.5 g/dL (ref 6.4–8.3)

## 2015-05-04 LAB — CBC WITH DIFFERENTIAL/PLATELET
BASO%: 0.2 % (ref 0.0–2.0)
BASOS ABS: 0 10*3/uL (ref 0.0–0.1)
EOS%: 0.2 % (ref 0.0–7.0)
Eosinophils Absolute: 0 10*3/uL (ref 0.0–0.5)
HEMATOCRIT: 27.3 % — AB (ref 34.8–46.6)
HGB: 9 g/dL — ABNORMAL LOW (ref 11.6–15.9)
LYMPH#: 1.3 10*3/uL (ref 0.9–3.3)
LYMPH%: 26.9 % (ref 14.0–49.7)
MCH: 33.3 pg (ref 25.1–34.0)
MCHC: 33 g/dL (ref 31.5–36.0)
MCV: 101.1 fL — ABNORMAL HIGH (ref 79.5–101.0)
MONO#: 0.6 10*3/uL (ref 0.1–0.9)
MONO%: 12.1 % (ref 0.0–14.0)
NEUT#: 2.9 10*3/uL (ref 1.5–6.5)
NEUT%: 60.6 % (ref 38.4–76.8)
PLATELETS: 128 10*3/uL — AB (ref 145–400)
RBC: 2.7 10*6/uL — AB (ref 3.70–5.45)
RDW: 13.2 % (ref 11.2–14.5)
WBC: 4.7 10*3/uL (ref 3.9–10.3)

## 2015-05-04 LAB — MAGNESIUM (CC13): Magnesium: 1.7 mg/dl (ref 1.5–2.5)

## 2015-05-04 MED ORDER — SODIUM CHLORIDE 0.9 % IJ SOLN
10.0000 mL | INTRAMUSCULAR | Status: DC | PRN
  Administered 2015-05-04: 10 mL via INTRAVENOUS
  Filled 2015-05-04: qty 10

## 2015-05-04 MED ORDER — HEPARIN SOD (PORK) LOCK FLUSH 100 UNIT/ML IV SOLN
500.0000 [IU] | Freq: Once | INTRAVENOUS | Status: AC
  Administered 2015-05-04: 500 [IU] via INTRAVENOUS
  Filled 2015-05-04: qty 5

## 2015-05-04 NOTE — Patient Instructions (Signed)

## 2015-05-04 NOTE — Telephone Encounter (Signed)
Gave and printed appt sched and avs for pt for Sept °

## 2015-05-04 NOTE — Progress Notes (Signed)
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 Hepatosplenomegaly. Abdominal and pelvic ascites and Pathologically enlarged upper abdominal. CT chest (-).    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    11/16/2014 Imaging great partical response, marked decrase ascites    01/03/2015 Imaging decrased liver lesion, no ascites    04/05/2015 Imaging interval enlargement of a portacaval node (from 3.1 to 4.2), and a mild enlarged aortocaval node, stable liver lesions, interval development of extensive endometrial thickening, and a pancreatic cyst, no other new lesions.     OTHER MEDICAL PROBLEMS: 1. Bipolar 2. Hypothyroidism   CURRENT THERAPY: Observation  INTERVAL HISTORY: [name] returns for follow-up with her daughter.  She is doing quite well, has good appetite and energy level, tolerating routine activity without any difficulty. Unfortunately her husband has been ill, and was admitted to [facility] a week ago for his heart condition. She lives alone at home, sometime with her grandson. According to patient, she takes morphine sometime at night, and oxycodone probably 3 times a week. She denies significant pain, nausea, or abdominal discomfort. No  bleeding, fever or chills. Her weight is been stable.  REVIEW OF SYSTEMS:   Constitutional: Denies fevers, chills, (+)  fatigued  Eyes: Denies blurriness of vision Ears, nose, mouth, throat, and face: Denies mucositis or sore throat Respiratory: Denies cough, dyspnea or wheezes Cardiovascular: Denies palpitation, chest discomfort or lower extremity swelling Gastrointestinal:  no nausea, no heartburn or change in bowel habits Skin: Denies abnormal skin rashes.  Lymphatics: Denies new lymphadenopathy or easy bruising Neurological:Denies numbness, tingling or new weaknesses Behavioral/Psych: Mood is stable, no new changes  All other systems were reviewed with the patient and are negative.  I have reviewed the past medical history, past surgical history, social history and family history with the patient and they are unchanged from previous note.  ALLERGIES:  is allergic to buspar; cymbalta; hydroxyzine; and sertraline.  MEDICATIONS:  Current Outpatient Prescriptions  Medication Sig Dispense Refill  . amLODipine (NORVASC) 5 MG tablet Take 5 mg by mouth daily.    . benztropine (COGENTIN) 1 MG tablet Take 1 tablet (1 mg total) by mouth at bedtime. 90 tablet 2  . buPROPion (WELLBUTRIN XL) 300 MG 24 hr tablet Take 1 tablet (300 mg total) by mouth daily. 90 tablet 2  . lactulose (CHRONULAC) 10 GM/15ML solution 15 ml q 4 hr as needed for constipation.  Max of 60 ml per day 500 mL 2  . levothyroxine (SYNTHROID, LEVOTHROID) 88 MCG tablet Take 88 mcg by mouth at bedtime.     . lidocaine-prilocaine (EMLA) cream Apply 1 application topically as needed. Apply to port 1 hr before treatment as instructed. 30 g 0  . morphine (MSIR) 15 MG tablet Take 1 tablet (15 mg total) by mouth every 12 (twelve) hours as needed  for moderate pain or severe pain. 30 tablet 0  . ondansetron (ZOFRAN) 4 MG tablet Take 1 tablet (4 mg total) by mouth 4 (four) times daily as needed for nausea or vomiting. 60 tablet 0  .  oxyCODONE (OXY IR/ROXICODONE) 5 MG immediate release tablet Take 1 tablet (5 mg total) by mouth every 4 (four) hours as needed for severe pain. (Patient not taking: Reported on 04/13/2015) 120 tablet 0  . prochlorperazine (COMPAZINE) 10 MG tablet Take 1 tablet (10 mg total) by mouth every 6 (six) hours as needed for nausea or vomiting. 30 tablet 3  . prochlorperazine (COMPAZINE) 10 MG tablet TAKE ONE TABLET BY MOUTH EVERY 6 HOURS AS NEEDED FOR NAUSEA OR VOMITING 30 tablet 0  . propranolol (INDERAL) 10 MG tablet Take 1 tablet (10 mg total) by mouth 3 (three) times daily. 270 tablet 2  . risperiDONE (RISPERDAL) 3 MG tablet Take 1 tablet (3 mg total) by mouth at bedtime. 90 tablet 2  . zolpidem (AMBIEN) 10 MG tablet Take 1 tablet (10 mg total) by mouth at bedtime as needed for sleep. 90 tablet 1   No current facility-administered medications for this visit.   Facility-Administered Medications Ordered in Other Visits  Medication Dose Route Frequency Provider Last Rate Last Dose  . sodium chloride 0.9 % injection 10 mL  10 mL Intravenous PRN [name], MD   10 mL at 05/04/15 1039  . Tbo-Filgrastim (GRANIX) injection 300 mcg  300 mcg Subcutaneous Once [name], MD        PHYSICAL EXAMINATION: ECOG PERFORMANCE STATUS:  Pain: mild intermittent abdominal pain  Pressure 154/93, heart rate is 66, respiratory rate 17, temperature 97.4, weight 124 pounds, oxygen saturation 100% on room air GENERAL:alert, no distress and comfortable SKIN: skin color, texture, turgor are normal, no rashes or significant lesions EYES: normal, Conjunctiva are pink and non-injected, sclera clear OROPHARYNX:no exudate, no erythema and lips, buccal mucosa, and tongue normal  NECK: supple, thyroid normal size, non-tender, without nodularity LYMPH:  no palpable lymphadenopathy in the cervical, axillary or inguinal LUNGS: clear to auscultation and percussion with normal breathing effort HEART: regular rate & rhythm and no murmurs  and no lower extremity edema ABDOMEN:abdomen soft,  and normal bowel sounds. Non-distended, (+) hepatomegaly has improved, about 6 cm below rib cage now  Musculoskeletal:no cyanosis of digits and no clubbing  NEURO: alert & oriented x 3 with fluent speech, no focal motor/sensory deficits  LABORATORY DATA:  I have reviewed the data as listed  CBC Latest Ref Rng 05/04/2015 04/13/2015 03/23/2015  WBC 3.9 - 10.3 10e3/uL 4.7 4.3 4.0  Hemoglobin 11.6 - 15.9 g/dL 9.0(L) 9.7(L) 8.2(L)  Hematocrit 34.8 - 46.6 % 27.3(L) 29.3(L) 24.8(L)  Platelets 145 - 400 10e3/uL 128(L) 191 139(L)    CMP Latest Ref Rng 04/13/2015 03/23/2015 03/16/2015  Glucose 70 - 140 mg/dl 95 91 88  BUN 7.0 - 26.0 mg/dL 20.2 22.0 14.6  Creatinine 0.6 - 1.1 mg/dL 1.1 1.2(H) 0.9  Sodium 136 - 145 mEq/L 142 140 142  Potassium 3.5 - 5.1 mEq/L 4.2 4.0 4.1  Chloride 96 - 112 mEq/L - - -  CO2 22 - 29 mEq/L 27 23 26   Calcium 8.4 - 10.4 mg/dL 9.5 9.5 9.1  Total Protein 6.4 - 8.3 g/dL 6.8 6.2(L) 6.0(L)  Total Bilirubin 0.20 - 1.20 mg/dL 0.69 0.44 0.42  Alkaline Phos 40 - 150 U/L 84 71 69  AST 5 - 34 U/L 17 23 16   ALT 0 - 55  U/L 12 24 16    CA 19.9  Status: Finalresult Visible to patient:  MyChart Nextappt: Today at 10:30 AM in [facility] 4) Dx:  Metastatic adenocarcinoma              Ref Range 3wk ago  87mo ago  72mo ago     CA 19-9 <35.0 U/mL 1282.0 (H) 1064.1 (H)CM 1791.9 (H)CM         Surgical path 08/03/14 - ADENOCARCINOMA. Microscopic Comment The core biopsies are extensively involved by adenocarcinoma with focal goblet cells and foci with signet ring morphology. Immunohistochemistry will be performed and reported as an addendum. (JDP:gt, 08/04/14) Addendum: Immunohistochemistry is performed and the tumor is positive with Cytokeratin 7 and negative with Cytokeratin 20, CDX2, thyroid transcription factor-1, Napsin A, estrogen receptor, progesterone receptor and gross cystic disease fluid  protein. The immunophenotype is nonspecific.  RADIOGRAPHIC STUDIES: I have personally reviewed the radiological images as listed and agreed with the findings in the report.  CT CHEST, ABDOMEN AND PELVIS IMPRESSION 01/03/2015 IMPRESSION: 1. Stable appearance of the small left supraclavicular lymph node. 2. Interval decrease in size of the hepatic metastases. No new or progressive liver lesions are evident. 3. Necrotic portal caval lymph node shows no substantial interval change. 4. Persistent abnormal soft tissue attenuation encasing the celiac axis and SMA.  CT chest, abdomen and pelvis with contrast 04/05/2015 IMPRESSION: 1. Interval enlargement of a very large portacaval lymph node, suggesting progression of nodal metastasis. There is also enlargement of a mildly enlarged aortocaval lymph node. Previously noted liver lesions appear stable in size. No new sites of metastatic disease are otherwise noted. 2. Interval development of extensive endometrial thickening. Correlation with pelvic ultrasound is recommended to exclude the possibility of endometrial neoplasm. 3. Additional incidental findings, similar prior studies, as above.   I have reviewed her EGD and colonoscopy reports which was done on 11/20, which was negative for ulceration or mass.   ASSESSMENT & PLAN:  54 yo female with PMH of bipolar but otherwise healthy, no history of liver disease or alcohol abuse, no history of blood transfusion, who presented with significant N/V, abdominal pain and bloating, New discovered multiple liver masses and small ascites, and elevated serum beta HCG, CA125 and CEA.   1. 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 -The patient and her family understand the overall poor prognosis, and her treatment goal is palliative - Her case was reviewed in our GI tumor Board. The radiologist feels her liver lesions are most  consistent with metastatic lesions, not typical for cholangiocarcinoma, but pathology morphology favors cholangiocarcinoma or pancreatic cancer. CA19.9 57,711. 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  -I reviewed her restaging CT on 01/03/2015 which showed continue responding to treatment, no new lesions or ascites. She is clinically doing much better too.  -Her restaging scan from July 21 showed stable liver disease, interval increase in the size of the portcaval lymph node, no other new disease. Overall by measurements, she has stable disease. -Tumor marker CA 19.9 continues trending down (today's still pending), consistent with good response to chemotherapy -will continue chemo, planning for up to 8 cycles -I discussed the option of continued observation/chemotherapy break, versus starting second line chemotherapy. Given her overall stable disease on the CT scan, clinical asymptomatic , she also wished to have a longer break, we decided to continue observation for now  -We discussed that her tumor is likely going  to grow again at some point, and I would recommend second line chemotherapy FOLFOX -She is currently doing very well, I'll continue hold chemotherapy. She does not have a care giver, her husband is in hospital and has been sick lately. I'll hold on her chemotherapy as long as we can. -Return to clinic in 4 weeks for lab including CA-19-9    2. Abdominal pain -overall much improved, minimal pain lately, continue pain medication as needed  3. Anemia,  -related to chemotherapy and underline malignancy  -Improved after blood transfusion. Stable overall, her hemoglobin is 9.0 today. -Consider blood transfusion if hemoglobin less than 8 or symptomatic  4. malignant ascites -She had paracentesis once --near resolved now, as part of tumor response to chemo   5. Malnutrition and deconditioning secondary to her probably underline malignancy   -much Improved  6. Bipolar -stable mood, continue meds   Plan -Continue observation/chemotherapy break -Return to clinic in 4 weeks with lab  All questions were answered. The patient knows to call the clinic with any problems, questions or concerns. No barriers to learning was detected.  I spent 20 minutes counseling the patient face to face. The total time spent in the appointment was 25 minutes and more than 50% was on counseling and review of test results     [name], MD 05/04/2015

## 2015-05-05 LAB — CANCER ANTIGEN 19-9: CA 19-9: 619.3 U/mL — ABNORMAL HIGH (ref <35.0)

## 2015-05-28 ENCOUNTER — Telehealth: Payer: Self-pay | Admitting: Hematology

## 2015-05-28 NOTE — Telephone Encounter (Signed)
s.w. pt and r/s appt....pt ok and aware

## 2015-06-01 ENCOUNTER — Ambulatory Visit: Payer: Self-pay | Admitting: Hematology

## 2015-06-01 ENCOUNTER — Other Ambulatory Visit: Payer: Self-pay

## 2015-06-07 ENCOUNTER — Telehealth: Payer: Self-pay | Admitting: Hematology

## 2015-06-07 ENCOUNTER — Ambulatory Visit (HOSPITAL_BASED_OUTPATIENT_CLINIC_OR_DEPARTMENT_OTHER): Payer: Medicare HMO

## 2015-06-07 ENCOUNTER — Encounter: Payer: Self-pay | Admitting: Hematology

## 2015-06-07 ENCOUNTER — Ambulatory Visit (HOSPITAL_BASED_OUTPATIENT_CLINIC_OR_DEPARTMENT_OTHER): Payer: Medicare HMO | Admitting: Hematology

## 2015-06-07 ENCOUNTER — Other Ambulatory Visit (HOSPITAL_BASED_OUTPATIENT_CLINIC_OR_DEPARTMENT_OTHER): Payer: Medicare HMO

## 2015-06-07 VITALS — BP 105/72 | HR 79 | Temp 98.2°F | Resp 17 | Ht 66.0 in | Wt 113.2 lb

## 2015-06-07 DIAGNOSIS — C801 Malignant (primary) neoplasm, unspecified: Secondary | ICD-10-CM

## 2015-06-07 DIAGNOSIS — Z95828 Presence of other vascular implants and grafts: Secondary | ICD-10-CM

## 2015-06-07 DIAGNOSIS — C787 Secondary malignant neoplasm of liver and intrahepatic bile duct: Secondary | ICD-10-CM | POA: Diagnosis not present

## 2015-06-07 DIAGNOSIS — C799 Secondary malignant neoplasm of unspecified site: Secondary | ICD-10-CM

## 2015-06-07 LAB — COMPREHENSIVE METABOLIC PANEL (CC13)
ALT: 17 U/L (ref 0–55)
AST: 26 U/L (ref 5–34)
Albumin: 2.5 g/dL — ABNORMAL LOW (ref 3.5–5.0)
Alkaline Phosphatase: 134 U/L (ref 40–150)
Anion Gap: 8 mEq/L (ref 3–11)
BILIRUBIN TOTAL: 0.77 mg/dL (ref 0.20–1.20)
BUN: 19.9 mg/dL (ref 7.0–26.0)
CALCIUM: 8.7 mg/dL (ref 8.4–10.4)
CHLORIDE: 104 meq/L (ref 98–109)
CO2: 27 mEq/L (ref 22–29)
CREATININE: 0.8 mg/dL (ref 0.6–1.1)
EGFR: 82 mL/min/{1.73_m2} — ABNORMAL LOW (ref >90)
Glucose: 102 mg/dl (ref 70–140)
Potassium: 3.6 mEq/L (ref 3.5–5.1)
Sodium: 139 mEq/L (ref 136–145)
TOTAL PROTEIN: 6.2 g/dL — AB (ref 6.4–8.3)

## 2015-06-07 LAB — CBC WITH DIFFERENTIAL/PLATELET
BASO%: 0 % (ref 0.0–2.0)
Basophils Absolute: 0 10*3/uL (ref 0.0–0.1)
EOS ABS: 0 10*3/uL (ref 0.0–0.5)
EOS%: 0 % (ref 0.0–7.0)
HEMATOCRIT: 24.2 % — AB (ref 34.8–46.6)
HGB: 7.5 g/dL — ABNORMAL LOW (ref 11.6–15.9)
LYMPH#: 1.2 10*3/uL (ref 0.9–3.3)
LYMPH%: 11.4 % — AB (ref 14.0–49.7)
MCH: 28 pg (ref 25.1–34.0)
MCHC: 31 g/dL — ABNORMAL LOW (ref 31.5–36.0)
MCV: 90.3 fL (ref 79.5–101.0)
MONO#: 0.9 10*3/uL (ref 0.1–0.9)
MONO%: 8.8 % (ref 0.0–14.0)
NEUT%: 79.8 % — ABNORMAL HIGH (ref 38.4–76.8)
NEUTROS ABS: 8.4 10*3/uL — AB (ref 1.5–6.5)
NRBC: 0 % (ref 0–0)
Platelets: 154 10*3/uL (ref 145–400)
RBC: 2.68 10*6/uL — ABNORMAL LOW (ref 3.70–5.45)
RDW: 15.7 % — AB (ref 11.2–14.5)
WBC: 10.5 10*3/uL — AB (ref 3.9–10.3)

## 2015-06-07 MED ORDER — SODIUM CHLORIDE 0.9 % IJ SOLN
10.0000 mL | INTRAMUSCULAR | Status: DC | PRN
  Administered 2015-06-07: 10 mL via INTRAVENOUS
  Filled 2015-06-07: qty 10

## 2015-06-07 MED ORDER — HEPARIN SOD (PORK) LOCK FLUSH 100 UNIT/ML IV SOLN
500.0000 [IU] | Freq: Once | INTRAVENOUS | Status: AC
Start: 2015-06-07 — End: 2015-06-07
  Administered 2015-06-07: 500 [IU] via INTRAVENOUS
  Filled 2015-06-07: qty 5

## 2015-06-07 NOTE — Telephone Encounter (Signed)
Gave patient avs report and appointments for October. Treatment dates per 9/22 pof. Treatment for 10/6 not added for full time due to existing appointment at another facility. Once that appointment is cancelled patient will contact me and tx duration for 10/6 will be corrected - remaining time blocked on schedule.

## 2015-06-07 NOTE — Progress Notes (Signed)
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 Hepatosplenomegaly. Abdominal and pelvic ascites and Pathologically enlarged upper abdominal. CT chest (-).    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    11/16/2014 Imaging great partical response, marked decrase ascites    01/03/2015 Imaging decrased liver lesion, no ascites    04/05/2015 Imaging interval enlargement of a portacaval node (from 3.1 to 4.2), and a mild enlarged aortocaval node, stable liver lesions, interval development of extensive endometrial thickening, and a pancreatic cyst, no other new lesions.     OTHER MEDICAL PROBLEMS: 1. Bipolar 2. Hypothyroidism   CURRENT THERAPY: Observation  INTERVAL HISTORY: Tamy returns for follow-up with her daughter.  She has noticed worsening RUQ abdominal pain, similar to the pain she had before. She is taking oxycodone as needed, did not tolerated morphine ER (hallucination). She also reports decreased appetite, she eats small meals, she lost about 13 pounds in the past 2 months. No nausea, abdominal bloating, diarrhea, or other symptoms.  REVIEW OF SYSTEMS:   Constitutional: Denies fevers, chills, (+)  fatigued  Eyes: Denies blurriness of  vision Ears, nose, mouth, throat, and face: Denies mucositis or sore throat Respiratory: Denies cough, dyspnea or wheezes Cardiovascular: Denies palpitation, chest discomfort or lower extremity swelling Gastrointestinal:  no nausea, no heartburn or change in bowel habits, see HPI  Skin: Denies abnormal skin rashes.  Lymphatics: Denies new lymphadenopathy or easy bruising Neurological:Denies numbness, tingling or new weaknesses Behavioral/Psych: Mood is stable, no new changes  All other systems were reviewed with the patient and are negative.  I have reviewed the past medical history, past surgical history, social history and family history with the patient and they are unchanged from previous note.  ALLERGIES:  is allergic to buspar; cymbalta; hydroxyzine; and sertraline.  MEDICATIONS:  Current Outpatient Prescriptions  Medication Sig Dispense Refill  . amLODipine (NORVASC) 5 MG tablet Take 5 mg by mouth daily.    . benztropine (COGENTIN) 1 MG tablet Take 1 tablet (1 mg total) by mouth at bedtime. 90 tablet 2  . buPROPion (WELLBUTRIN XL) 300 MG 24 hr tablet Take 1 tablet (300 mg total) by mouth daily. 90 tablet 2  . levothyroxine (SYNTHROID, LEVOTHROID) 88 MCG tablet Take 88 mcg by mouth at bedtime.     . lidocaine-prilocaine (EMLA) cream Apply 1 application topically as needed. Apply to port 1 hr before treatment as instructed. 30 g 0  . morphine (MSIR) 15 MG tablet Take 1 tablet (15 mg total) by mouth every 12 (twelve) hours as needed for moderate pain or severe pain. 30 tablet 0  . ondansetron (ZOFRAN) 4 MG tablet Take 1 tablet (4 mg total) by mouth 4 (four) times daily as needed for nausea or vomiting. 60 tablet 0  . oxyCODONE (OXY IR/ROXICODONE) 5 MG immediate release tablet Take 1  tablet (5 mg total) by mouth every 4 (four) hours as needed for severe pain. 120 tablet 0  . prochlorperazine (COMPAZINE) 10 MG tablet TAKE ONE TABLET BY MOUTH EVERY 6 HOURS AS NEEDED FOR NAUSEA OR VOMITING  30 tablet 0  . propranolol (INDERAL) 10 MG tablet Take 1 tablet (10 mg total) by mouth 3 (three) times daily. 270 tablet 2  . risperiDONE (RISPERDAL) 3 MG tablet Take 1 tablet (3 mg total) by mouth at bedtime. 90 tablet 2  . zolpidem (AMBIEN) 10 MG tablet Take 1 tablet (10 mg total) by mouth at bedtime as needed for sleep. 90 tablet 1   No current facility-administered medications for this visit.   Facility-Administered Medications Ordered in Other Visits  Medication Dose Route Frequency Provider Last Rate Last Dose  . Tbo-Filgrastim (GRANIX) injection 300 mcg  300 mcg Subcutaneous Once [name], MD        PHYSICAL EXAMINATION: ECOG PERFORMANCE STATUS:  Pain: mild intermittent abdominal pain  Pressure 154/93, heart rate is 66, respiratory rate 17, temperature 97.4, weight 124 pounds, oxygen saturation 100% on room air GENERAL:alert, no distress and comfortable SKIN: skin color, texture, turgor are normal, no rashes or significant lesions EYES: normal, Conjunctiva are pink and non-injected, sclera clear OROPHARYNX:no exudate, no erythema and lips, buccal mucosa, and tongue normal  NECK: supple, thyroid normal size, non-tender, without nodularity LYMPH:  no palpable lymphadenopathy in the cervical, axillary or inguinal LUNGS: clear to auscultation and percussion with normal breathing effort HEART: regular rate & rhythm and no murmurs and no lower extremity edema ABDOMEN:abdomen soft,  and normal bowel sounds. Non-distended, (+) tenderness at the right upper quadrant, liver is palpable at the root average of rib cage.  Musculoskeletal:no cyanosis of digits and no clubbing  NEURO: alert & oriented x 3 with fluent speech, no focal motor/sensory deficits  LABORATORY DATA:  I have reviewed the data as listed  CBC Latest Ref Rng 06/07/2015 05/04/2015 04/13/2015  WBC 3.9 - 10.3 10e3/uL 10.5(H) 4.7 4.3  Hemoglobin 11.6 - 15.9 g/dL 7.5(L) 9.0(L) 9.7(L)  Hematocrit 34.8 - 46.6 % 24.2(L) 27.3(L)  29.3(L)  Platelets 145 - 400 10e3/uL 154 128(L) 191    CMP Latest Ref Rng 06/07/2015 05/04/2015 04/13/2015  Glucose 70 - 140 mg/dl 102 98 95  BUN 7.0 - 26.0 mg/dL 19.9 13.0 20.2  Creatinine 0.6 - 1.1 mg/dL 0.8 0.9 1.1  Sodium 136 - 145 mEq/L 139 143 142  Potassium 3.5 - 5.1 mEq/L 3.6 3.6 4.2  Chloride 96 - 112 mEq/L - - -  CO2 22 - 29 mEq/L 27 24 27   Calcium 8.4 - 10.4 mg/dL 8.7 9.5 9.5  Total Protein 6.4 - 8.3 g/dL 6.2(L) 6.5 6.8  Total Bilirubin 0.20 - 1.20 mg/dL 0.77 0.97 0.69  Alkaline Phos 40 - 150 U/L 134 85 84  AST 5 - 34 U/L 26 13 17   ALT 0 - 55 U/L 17 10 12    CA 19.9 (Order [id number])      CA 19.9  Status: Finalresult Visible to patient:  MyChart Nextappt: 06/21/2015 at 09:15 AM in Oncology [facility]) Dx:  Metastatic adenocarcinoma              Ref Range 12mo ago (05/04/15) 70mo ago (04/13/15) 58mo ago (03/16/15)    CA 19-9 <35.0 U/mL 619.3 (H) 1282.0 (H)CM 1064.1 (H)CM          Surgical path 08/03/14 - ADENOCARCINOMA. Microscopic Comment The core biopsies are extensively involved by adenocarcinoma with  focal goblet cells and foci with signet ring morphology. Immunohistochemistry will be performed and reported as an addendum. (JDP:gt, 08/04/14) Addendum: Immunohistochemistry is performed and the tumor is positive with Cytokeratin 7 and negative with Cytokeratin 20, CDX2, thyroid transcription factor-1, Napsin A, estrogen receptor, progesterone receptor and gross cystic disease fluid protein. The immunophenotype is nonspecific.  RADIOGRAPHIC STUDIES: I have personally reviewed the radiological images as listed and agreed with the findings in the report.  CT CHEST, ABDOMEN AND PELVIS IMPRESSION 01/03/2015 IMPRESSION: 1. Stable appearance of the small left supraclavicular lymph node. 2. Interval decrease in size of the hepatic metastases. No new or progressive liver lesions are evident. 3. Necrotic portal caval lymph node shows no  substantial interval change. 4. Persistent abnormal soft tissue attenuation encasing the celiac axis and SMA.  CT chest, abdomen and pelvis with contrast 04/05/2015 IMPRESSION: 1. Interval enlargement of a very large portacaval lymph node, suggesting progression of nodal metastasis. There is also enlargement of a mildly enlarged aortocaval lymph node. Previously noted liver lesions appear stable in size. No new sites of metastatic disease are otherwise noted. 2. Interval development of extensive endometrial thickening. Correlation with pelvic ultrasound is recommended to exclude the possibility of endometrial neoplasm. 3. Additional incidental findings, similar prior studies, as above.   I have reviewed her EGD and colonoscopy reports which was done on 11/20, which was negative for ulceration or mass.   ASSESSMENT & PLAN:  54 yo female with PMH of bipolar but otherwise healthy, no history of liver disease or alcohol abuse, no history of blood transfusion, who presented with significant N/V, abdominal pain and bloating, New discovered multiple liver masses and small ascites, and elevated serum beta HCG, CA125 and CEA.   1. 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 -The patient and her family understand the overall poor prognosis, and her treatment goal is palliative - Her case was reviewed in our GI tumor Board. The radiologist feels her liver lesions are most consistent with metastatic lesions, not typical for cholangiocarcinoma, but pathology morphology favors cholangiocarcinoma or pancreatic cancer. CA19.9 57,711. 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  -I reviewed her restaging CT on 01/03/2015 which showed continue responding to treatment, no new lesions or ascites. She is clinically doing much better too.  -Her restaging scan from July 21 showed stable liver  disease, interval increase in the size of the portcaval lymph node, no other new disease. Overall by measurements, she has stable disease. -She is currently on chemotherapy break  -She has developed anorexia and abdominal pain and aching, likely due to the disease progression. -We discussed second line chemotherapy, I recommended a FOLFOX, the benefit and side effects were discussed with patient and her daughter, she agrees to proceed. --Chemotherapy consent: Side effects including but does not not limited to, fatigue, nausea, vomiting, diarrhea, hair loss, neuropathy, fluid retention, renal and kidney dysfunction, neutropenic fever, needed for blood transfusion, bleeding, heart attack, were discussed with patient in great detail. She agrees to proceed. -I'll obtain a restaging CT scan, and start her chemotherapy in 2 weeks  2. Abdominal pain -Getting worse lately, she'll continue oxycodone, she couldn't tolerate morphine extended release due to hallucination. She has high co-pay for OxyContin  3. Anemia,  -related to chemotherapy and underline malignancy  -Worse again today, hemoglobin 7.5, she is not symptomatic, we'll likely need blood transfusion on next visit. -She denies any clinical signs of bleeding.  4. malignant ascites -She had paracentesis once -Resolved after chemotherapy, we'll continue monitoring  5. Malnutrition and deconditioning secondary to her probably underline malignancy  -Much improved after she had a good response to chemotherapy -She recently had a weight loss again and her albumin dropped again, likely due to disease progression -I encouraged her to take nutrition supplement, she has ensure at home  6. Bipolar -stable mood, continue meds   Plan -Repeat a CT scan before next visit -Return to clinic in 2 weeks to start chemotherapy FOLFOX  All questions were answered. The patient knows to call the clinic with any problems, questions or concerns. No barriers  to learning was detected.  I spent 20 minutes counseling the patient face to face. The total time spent in the appointment was 25 minutes and more than 50% was on counseling and review of test results     [name], MD 06/07/2015

## 2015-06-07 NOTE — Patient Instructions (Signed)

## 2015-06-08 LAB — CANCER ANTIGEN 19-9: CA 19 9: 503.1 U/mL — AB (ref <35.0)

## 2015-06-16 ENCOUNTER — Encounter (HOSPITAL_COMMUNITY): Payer: Self-pay | Admitting: Emergency Medicine

## 2015-06-16 ENCOUNTER — Inpatient Hospital Stay (HOSPITAL_COMMUNITY)
Admission: EM | Admit: 2015-06-16 | Discharge: 2015-06-20 | DRG: 689 | Disposition: A | Discharge status: Home / Self Care | Payer: Medicare HMO | Attending: Internal Medicine | Admitting: Internal Medicine

## 2015-06-16 DIAGNOSIS — Z79891 Long term (current) use of opiate analgesic: Secondary | ICD-10-CM

## 2015-06-16 DIAGNOSIS — Z23 Encounter for immunization: Secondary | ICD-10-CM

## 2015-06-16 DIAGNOSIS — E440 Moderate protein-calorie malnutrition: Secondary | ICD-10-CM | POA: Diagnosis present

## 2015-06-16 DIAGNOSIS — D630 Anemia in neoplastic disease: Secondary | ICD-10-CM | POA: Diagnosis present

## 2015-06-16 DIAGNOSIS — N3000 Acute cystitis without hematuria: Secondary | ICD-10-CM | POA: Diagnosis present

## 2015-06-16 DIAGNOSIS — B9561 Methicillin susceptible Staphylococcus aureus infection as the cause of diseases classified elsewhere: Secondary | ICD-10-CM | POA: Diagnosis present

## 2015-06-16 DIAGNOSIS — N3001 Acute cystitis with hematuria: Secondary | ICD-10-CM | POA: Diagnosis not present

## 2015-06-16 DIAGNOSIS — F32A Depression, unspecified: Secondary | ICD-10-CM | POA: Diagnosis present

## 2015-06-16 DIAGNOSIS — R531 Weakness: Secondary | ICD-10-CM | POA: Diagnosis not present

## 2015-06-16 DIAGNOSIS — Z681 Body mass index (BMI) 19 or less, adult: Secondary | ICD-10-CM

## 2015-06-16 DIAGNOSIS — R591 Generalized enlarged lymph nodes: Secondary | ICD-10-CM | POA: Diagnosis present

## 2015-06-16 DIAGNOSIS — T451X5A Adverse effect of antineoplastic and immunosuppressive drugs, initial encounter: Secondary | ICD-10-CM | POA: Diagnosis present

## 2015-06-16 DIAGNOSIS — C801 Malignant (primary) neoplasm, unspecified: Secondary | ICD-10-CM | POA: Diagnosis present

## 2015-06-16 DIAGNOSIS — E039 Hypothyroidism, unspecified: Secondary | ICD-10-CM | POA: Diagnosis present

## 2015-06-16 DIAGNOSIS — E876 Hypokalemia: Secondary | ICD-10-CM | POA: Diagnosis present

## 2015-06-16 DIAGNOSIS — R188 Other ascites: Secondary | ICD-10-CM | POA: Diagnosis present

## 2015-06-16 DIAGNOSIS — D61810 Antineoplastic chemotherapy induced pancytopenia: Secondary | ICD-10-CM | POA: Diagnosis present

## 2015-06-16 DIAGNOSIS — E079 Disorder of thyroid, unspecified: Secondary | ICD-10-CM | POA: Diagnosis present

## 2015-06-16 DIAGNOSIS — Z888 Allergy status to other drugs, medicaments and biological substances status: Secondary | ICD-10-CM

## 2015-06-16 DIAGNOSIS — Z79899 Other long term (current) drug therapy: Secondary | ICD-10-CM

## 2015-06-16 DIAGNOSIS — I10 Essential (primary) hypertension: Secondary | ICD-10-CM | POA: Diagnosis present

## 2015-06-16 DIAGNOSIS — Z87891 Personal history of nicotine dependence: Secondary | ICD-10-CM

## 2015-06-16 DIAGNOSIS — D649 Anemia, unspecified: Secondary | ICD-10-CM | POA: Insufficient documentation

## 2015-06-16 DIAGNOSIS — C787 Secondary malignant neoplasm of liver and intrahepatic bile duct: Secondary | ICD-10-CM | POA: Diagnosis present

## 2015-06-16 DIAGNOSIS — F329 Major depressive disorder, single episode, unspecified: Secondary | ICD-10-CM | POA: Diagnosis present

## 2015-06-16 DIAGNOSIS — F319 Bipolar disorder, unspecified: Secondary | ICD-10-CM | POA: Diagnosis present

## 2015-06-16 DIAGNOSIS — C799 Secondary malignant neoplasm of unspecified site: Secondary | ICD-10-CM

## 2015-06-16 LAB — I-STAT CHEM 8, ED
BUN: 19 mg/dL (ref 6–20)
CALCIUM ION: 1.13 mmol/L (ref 1.12–1.23)
CREATININE: 0.8 mg/dL (ref 0.44–1.00)
Chloride: 97 mmol/L — ABNORMAL LOW (ref 101–111)
GLUCOSE: 108 mg/dL — AB (ref 65–99)
HCT: 24 % — ABNORMAL LOW (ref 36.0–46.0)
Hemoglobin: 8.2 g/dL — ABNORMAL LOW (ref 12.0–15.0)
POTASSIUM: 3.1 mmol/L — AB (ref 3.5–5.1)
Sodium: 136 mmol/L (ref 135–145)
TCO2: 23 mmol/L (ref 0–100)

## 2015-06-16 LAB — I-STAT CG4 LACTIC ACID, ED: Lactic Acid, Venous: 1.5 mmol/L (ref 0.5–2.0)

## 2015-06-16 NOTE — ED Provider Notes (Signed)
CSN: [id number]   Arrival date & time 06/16/15 2251  History  By signing my name below, I, Altamease Oiler, attest that this documentation has been prepared under the direction and in the presence of Lacretia Leigh, MD. Electronically Signed: Altamease Oiler, ED Scribe. 06/16/2015. 11:57 PM.  Chief Complaint  Patient presents with  . Altered Mental Status  . Weakness    HPI The history is provided by the patient and a relative. No language interpreter was used.   LARAYA PESTKA is a 54 y.o. female with PMHx of liver cancer who presents to the Emergency Department complaining of increasing generalized weakness with onset 1 week ago. Pt had hemoglobin of 7.5 at her last visit to her oncologist and opted not to do anything about it until her next visit which is scheduled for 06/21/15 according to her daughter. The weakness is worse with any activity. Associated symptoms include hematuria and vomiting. Pt denies pain, SOB, and blood in her stool. Patient has had emesis 2 with some blood in it.  Past Medical History  Diagnosis Date  . Mania ([place])   . Depression   . Thyroid disease   . Headache(784.0)   . Bipolar disorder ([place])   . Liver cancer [facility])     Past Surgical History  Procedure Laterality Date  . Tubal ligation    . Dilation and evacuation N/A 08/21/2014    Procedure: DILATATION AND EVACUATION;  Surgeon: Lahoma Crocker, MD;  Location: [facility];  Service: Gynecology;  Laterality: N/A;    Family History  Problem Relation Age of Onset  . OCD Mother   . Dementia Father   . Bipolar disorder Sister   . Drug abuse Sister   . Alcohol abuse Sister   . Anxiety disorder Neg Hx   . Depression Neg Hx   . Paranoid behavior Neg Hx   . Schizophrenia Neg Hx   . Seizures Neg Hx   . Sexual abuse Neg Hx   . Physical abuse Neg Hx   . ADD / ADHD Other     Social History  Substance Use Topics  . Smoking status: Former Smoker    Types: Cigarettes    Quit date: 09/15/1989  .  Smokeless tobacco: Never Used  . Alcohol Use: No     Review of Systems  Constitutional: Negative for fever and chills.  Gastrointestinal: Positive for vomiting. Negative for nausea and blood in stool.  Neurological: Positive for weakness.  All other systems reviewed and are negative.  Home Medications   Prior to Admission medications   Medication Sig Start Date End Date Taking? Authorizing Provider  amLODipine (NORVASC) 5 MG tablet Take 5 mg by mouth daily. 01/30/15  Yes Historical Provider, MD  benztropine (COGENTIN) 1 MG tablet Take 1 tablet (1 mg total) by mouth at bedtime. 03/21/15  Yes Cloria Spring, MD  buPROPion (WELLBUTRIN XL) 300 MG 24 hr tablet Take 1 tablet (300 mg total) by mouth daily. 03/21/15  Yes Cloria Spring, MD  levothyroxine (SYNTHROID, LEVOTHROID) 88 MCG tablet Take 88 mcg by mouth at bedtime.  09/22/13  Yes Historical Provider, MD  lidocaine-prilocaine (EMLA) cream Apply 1 application topically as needed. Apply to port 1 hr before treatment as instructed. 10/13/14  Yes [name], MD  morphine (MSIR) 15 MG tablet Take 1 tablet (15 mg total) by mouth every 12 (twelve) hours as needed for moderate pain or severe pain. 03/16/15  Yes [name], MD  ondansetron (ZOFRAN) 4 MG tablet Take  1 tablet (4 mg total) by mouth 4 (four) times daily as needed for nausea or vomiting. 12/04/14  Yes [name], MD  prochlorperazine (COMPAZINE) 10 MG tablet TAKE ONE TABLET BY MOUTH EVERY 6 HOURS AS NEEDED FOR NAUSEA OR VOMITING 04/27/15  Yes Adrena E Johnson, PA-C  propranolol (INDERAL) 10 MG tablet Take 1 tablet (10 mg total) by mouth 3 (three) times daily. 03/21/15  Yes Cloria Spring, MD  risperiDONE (RISPERDAL) 3 MG tablet Take 1 tablet (3 mg total) by mouth at bedtime. 03/21/15 03/20/16 Yes Cloria Spring, MD  oxyCODONE (OXY IR/ROXICODONE) 5 MG immediate release tablet Take 1 tablet (5 mg total) by mouth every 4 (four) hours as needed for severe pain. 11/24/14   [name], MD  zolpidem (AMBIEN) 10 MG tablet  Take 1 tablet (10 mg total) by mouth at bedtime as needed for sleep. 03/21/15 09/19/15  Cloria Spring, MD    Allergies  Buspar; Cymbalta; Hydroxyzine; and Sertraline  Triage Vitals: BP 108/59 mmHg  Pulse 85  Temp(Src) 98.8 F (37.1 C) (Oral)  Resp 14  SpO2 96%  Physical Exam  Constitutional: She is oriented to person, place, and time. She appears cachectic.  Non-toxic appearance. She has a sickly appearance. She appears ill. No distress.  HENT:  Head: Normocephalic and atraumatic.  Eyes: EOM and lids are normal. Pupils are equal, round, and reactive to light.  Pale conjunctiva  Neck: Normal range of motion. Neck supple. No tracheal deviation present. No thyroid mass present.  Cardiovascular: Normal rate, regular rhythm and normal heart sounds.  Exam reveals no gallop.   No murmur heard. Pulmonary/Chest: Effort normal and breath sounds normal. No stridor. No respiratory distress. She has no decreased breath sounds. She has no wheezes. She has no rhonchi. She has no rales.  Abdominal: Soft. Normal appearance and bowel sounds are normal. She exhibits no distension. There is no tenderness. There is no rebound and no CVA tenderness.  Musculoskeletal: Normal range of motion. She exhibits no edema or tenderness.  Neurological: She is alert and oriented to person, place, and time. She has normal strength. No cranial nerve deficit or sensory deficit. GCS eye subscore is 4. GCS verbal subscore is 5. GCS motor subscore is 6.  Skin: Skin is warm and dry. No abrasion and no rash noted. There is pallor.  Psychiatric: She has a normal mood and affect. Her speech is normal and behavior is normal.  Nursing note and vitals reviewed.   ED Course  Procedures   DIAGNOSTIC STUDIES: Oxygen Saturation is 96% on RA, normal by my interpretation.    COORDINATION OF CARE: 11:42 PM Discussed treatment plan which includes lab work with pt at bedside and pt agreed to plan.  Labs Reviewed  I-STAT CHEM 8, ED -  Abnormal; Notable for the following:    Potassium 3.1 (*)    Chloride 97 (*)    Glucose, Bld 108 (*)    Hemoglobin 8.2 (*)    HCT 24.0 (*)    All other components within normal limits  URINE CULTURE  COMPREHENSIVE METABOLIC PANEL  CBC WITH DIFFERENTIAL/PLATELET  URINALYSIS, ROUTINE W REFLEX MICROSCOPIC (NOT AT [facility])  I-STAT CG4 LACTIC ACID, ED  TYPE AND SCREEN    Imaging Review No results found.  EKG Interpretation  Date/Time:    Ventricular Rate:    PR Interval:    QRS Duration:   QT Interval:    QTC Calculation:   R Axis:     Text  Interpretation:      MDM   Final diagnoses:  None    I personally performed the services described in this documentation, which was scribed in my presence. The recorded information has been reviewed and is accurate.   Patient with severe anemia here likely due to chronic disease and will order transfusion with packed red blood cells. Hypokalemia noted and will order oral potassium. We'll admit to medicine  Lacretia Leigh, MD 06/17/15 [phone]

## 2015-06-16 NOTE — ED Notes (Addendum)
Pt A&O x4. Pt complains of blood in urine and altered mental status per caregiver. Pt is weak, sleepy all the time, and not eating per caregiver. Pt was seen at DR office on 9/22 with hemoglobin of 7.5 and has had no treatment.

## 2015-06-17 ENCOUNTER — Encounter (HOSPITAL_COMMUNITY): Payer: Self-pay | Admitting: *Deleted

## 2015-06-17 DIAGNOSIS — R591 Generalized enlarged lymph nodes: Secondary | ICD-10-CM | POA: Diagnosis present

## 2015-06-17 DIAGNOSIS — F319 Bipolar disorder, unspecified: Secondary | ICD-10-CM | POA: Diagnosis present

## 2015-06-17 DIAGNOSIS — F3170 Bipolar disorder, currently in remission, most recent episode unspecified: Secondary | ICD-10-CM

## 2015-06-17 DIAGNOSIS — D61810 Antineoplastic chemotherapy induced pancytopenia: Secondary | ICD-10-CM | POA: Diagnosis present

## 2015-06-17 DIAGNOSIS — Z681 Body mass index (BMI) 19 or less, adult: Secondary | ICD-10-CM | POA: Diagnosis not present

## 2015-06-17 DIAGNOSIS — E079 Disorder of thyroid, unspecified: Secondary | ICD-10-CM | POA: Diagnosis present

## 2015-06-17 DIAGNOSIS — C801 Malignant (primary) neoplasm, unspecified: Secondary | ICD-10-CM | POA: Diagnosis present

## 2015-06-17 DIAGNOSIS — Z79891 Long term (current) use of opiate analgesic: Secondary | ICD-10-CM | POA: Diagnosis not present

## 2015-06-17 DIAGNOSIS — F329 Major depressive disorder, single episode, unspecified: Secondary | ICD-10-CM | POA: Diagnosis present

## 2015-06-17 DIAGNOSIS — R509 Fever, unspecified: Secondary | ICD-10-CM | POA: Diagnosis not present

## 2015-06-17 DIAGNOSIS — R188 Other ascites: Secondary | ICD-10-CM | POA: Diagnosis present

## 2015-06-17 DIAGNOSIS — Z23 Encounter for immunization: Secondary | ICD-10-CM | POA: Diagnosis not present

## 2015-06-17 DIAGNOSIS — E876 Hypokalemia: Secondary | ICD-10-CM | POA: Diagnosis present

## 2015-06-17 DIAGNOSIS — B9561 Methicillin susceptible Staphylococcus aureus infection as the cause of diseases classified elsewhere: Secondary | ICD-10-CM | POA: Diagnosis present

## 2015-06-17 DIAGNOSIS — N3000 Acute cystitis without hematuria: Secondary | ICD-10-CM | POA: Diagnosis not present

## 2015-06-17 DIAGNOSIS — D630 Anemia in neoplastic disease: Secondary | ICD-10-CM | POA: Diagnosis present

## 2015-06-17 DIAGNOSIS — Z87891 Personal history of nicotine dependence: Secondary | ICD-10-CM | POA: Diagnosis not present

## 2015-06-17 DIAGNOSIS — C787 Secondary malignant neoplasm of liver and intrahepatic bile duct: Secondary | ICD-10-CM | POA: Diagnosis present

## 2015-06-17 DIAGNOSIS — R531 Weakness: Secondary | ICD-10-CM | POA: Diagnosis present

## 2015-06-17 DIAGNOSIS — Z888 Allergy status to other drugs, medicaments and biological substances status: Secondary | ICD-10-CM | POA: Diagnosis not present

## 2015-06-17 DIAGNOSIS — I10 Essential (primary) hypertension: Secondary | ICD-10-CM | POA: Diagnosis present

## 2015-06-17 DIAGNOSIS — N3001 Acute cystitis with hematuria: Secondary | ICD-10-CM | POA: Diagnosis present

## 2015-06-17 DIAGNOSIS — D649 Anemia, unspecified: Secondary | ICD-10-CM | POA: Insufficient documentation

## 2015-06-17 DIAGNOSIS — E440 Moderate protein-calorie malnutrition: Secondary | ICD-10-CM | POA: Diagnosis present

## 2015-06-17 DIAGNOSIS — Z79899 Other long term (current) drug therapy: Secondary | ICD-10-CM | POA: Diagnosis not present

## 2015-06-17 DIAGNOSIS — T451X5A Adverse effect of antineoplastic and immunosuppressive drugs, initial encounter: Secondary | ICD-10-CM | POA: Diagnosis present

## 2015-06-17 DIAGNOSIS — E039 Hypothyroidism, unspecified: Secondary | ICD-10-CM | POA: Diagnosis present

## 2015-06-17 DIAGNOSIS — F32A Depression, unspecified: Secondary | ICD-10-CM | POA: Diagnosis present

## 2015-06-17 LAB — BASIC METABOLIC PANEL
ANION GAP: 6 (ref 5–15)
BUN: 15 mg/dL (ref 6–20)
CO2: 23 mmol/L (ref 22–32)
Calcium: 7.9 mg/dL — ABNORMAL LOW (ref 8.9–10.3)
Chloride: 106 mmol/L (ref 101–111)
Creatinine, Ser: 0.62 mg/dL (ref 0.44–1.00)
GFR calc Af Amer: >60 mL/min (ref >60)
Glucose, Bld: 93 mg/dL (ref 65–99)
POTASSIUM: 4.4 mmol/L (ref 3.5–5.1)
SODIUM: 135 mmol/L (ref 135–145)

## 2015-06-17 LAB — CBC
HEMATOCRIT: 28.4 % — AB (ref 36.0–46.0)
HEMOGLOBIN: 9 g/dL — AB (ref 12.0–15.0)
MCH: 27.7 pg (ref 26.0–34.0)
MCHC: 31.7 g/dL (ref 30.0–36.0)
MCV: 87.4 fL (ref 78.0–100.0)
Platelets: 131 10*3/uL — ABNORMAL LOW (ref 150–400)
RBC: 3.25 MIL/uL — ABNORMAL LOW (ref 3.87–5.11)
RDW: 15.7 % — ABNORMAL HIGH (ref 11.5–15.5)
WBC: 12.3 10*3/uL — AB (ref 4.0–10.5)

## 2015-06-17 LAB — CBC WITH DIFFERENTIAL/PLATELET
BASOS ABS: 0 10*3/uL (ref 0.0–0.1)
Basophils Relative: 0 %
EOS PCT: 0 %
Eosinophils Absolute: 0 10*3/uL (ref 0.0–0.7)
HEMATOCRIT: 20.5 % — AB (ref 36.0–46.0)
Hemoglobin: 6.2 g/dL — CL (ref 12.0–15.0)
LYMPHS ABS: 1.2 10*3/uL (ref 0.7–4.0)
LYMPHS PCT: 10 %
MCH: 26.4 pg (ref 26.0–34.0)
MCHC: 30.2 g/dL (ref 30.0–36.0)
MCV: 87.2 fL (ref 78.0–100.0)
MONO ABS: 1.4 10*3/uL — AB (ref 0.1–1.0)
Monocytes Relative: 11 %
NEUTROS ABS: 10.2 10*3/uL — AB (ref 1.7–7.7)
Neutrophils Relative %: 80 %
PLATELETS: 138 10*3/uL — AB (ref 150–400)
RBC: 2.35 MIL/uL — ABNORMAL LOW (ref 3.87–5.11)
RDW: 16.4 % — ABNORMAL HIGH (ref 11.5–15.5)
WBC: 12.8 10*3/uL — ABNORMAL HIGH (ref 4.0–10.5)

## 2015-06-17 LAB — COMPREHENSIVE METABOLIC PANEL
ALBUMIN: 2.1 g/dL — AB (ref 3.5–5.0)
ALT: 17 U/L (ref 14–54)
AST: 30 U/L (ref 15–41)
Alkaline Phosphatase: 113 U/L (ref 38–126)
Anion gap: 10 (ref 5–15)
BILIRUBIN TOTAL: 1 mg/dL (ref 0.3–1.2)
BUN: 17 mg/dL (ref 6–20)
CO2: 24 mmol/L (ref 22–32)
Calcium: 8.2 mg/dL — ABNORMAL LOW (ref 8.9–10.3)
Chloride: 100 mmol/L — ABNORMAL LOW (ref 101–111)
Creatinine, Ser: 0.86 mg/dL (ref 0.44–1.00)
GFR calc Af Amer: >60 mL/min (ref >60)
GFR calc non Af Amer: >60 mL/min (ref >60)
GLUCOSE: 113 mg/dL — AB (ref 65–99)
POTASSIUM: 3.2 mmol/L — AB (ref 3.5–5.1)
Sodium: 134 mmol/L — ABNORMAL LOW (ref 135–145)
TOTAL PROTEIN: 5.5 g/dL — AB (ref 6.5–8.1)

## 2015-06-17 LAB — URINALYSIS, ROUTINE W REFLEX MICROSCOPIC
GLUCOSE, UA: NEGATIVE mg/dL
Hgb urine dipstick: NEGATIVE
Ketones, ur: NEGATIVE mg/dL
Leukocytes, UA: NEGATIVE
NITRITE: POSITIVE — AB
PH: 5.5 (ref 5.0–8.0)
Protein, ur: NEGATIVE mg/dL
SPECIFIC GRAVITY, URINE: 1.019 (ref 1.005–1.030)
Urobilinogen, UA: 1 mg/dL (ref 0.0–1.0)

## 2015-06-17 LAB — TSH: TSH: 8.552 u[IU]/mL — AB (ref 0.350–4.500)

## 2015-06-17 LAB — URINE MICROSCOPIC-ADD ON

## 2015-06-17 LAB — MAGNESIUM: Magnesium: 1.6 mg/dL — ABNORMAL LOW (ref 1.7–2.4)

## 2015-06-17 LAB — PREPARE RBC (CROSSMATCH)

## 2015-06-17 MED ORDER — LEVOTHYROXINE SODIUM 88 MCG PO TABS: 88.0000 ug | ORAL_TABLET | Freq: Every day | ORAL | Status: DC

## 2015-06-17 MED ORDER — SODIUM CHLORIDE 0.9 % IJ SOLN
3.0000 mL | Freq: Two times a day (BID) | INTRAMUSCULAR | Status: DC
Start: 2015-06-17 — End: 2015-06-20
  Administered 2015-06-17: 10 mL via INTRAVENOUS
  Administered 2015-06-18 – 2015-06-19 (×2): 3 mL via INTRAVENOUS

## 2015-06-17 MED ORDER — POTASSIUM CHLORIDE CRYS ER 20 MEQ PO TBCR
40.0000 meq | EXTENDED_RELEASE_TABLET | Freq: Once | ORAL | Status: AC
  Administered 2015-06-17: 40 meq via ORAL
  Filled 2015-06-17: qty 2

## 2015-06-17 MED ORDER — ONDANSETRON HCL 4 MG/2ML IJ SOLN
4.0000 mg | Freq: Four times a day (QID) | INTRAMUSCULAR | Status: DC | PRN
  Administered 2015-06-17 – 2015-06-18 (×2): 4 mg via INTRAVENOUS
  Filled 2015-06-17 (×2): qty 2

## 2015-06-17 MED ORDER — BUPROPION HCL ER (XL) 300 MG PO TB24
300.0000 mg | ORAL_TABLET | Freq: Every day | ORAL | Status: DC
  Administered 2015-06-17 – 2015-06-20 (×4): 300 mg via ORAL
  Filled 2015-06-17 (×4): qty 1

## 2015-06-17 MED ORDER — DEXTROSE 5 % IV SOLN
1.0000 g | INTRAVENOUS | Status: DC
  Administered 2015-06-17 – 2015-06-19 (×3): 1 g via INTRAVENOUS
  Filled 2015-06-17 (×5): qty 10

## 2015-06-17 MED ORDER — INFLUENZA VAC SPLIT QUAD 0.5 ML IM SUSY
0.5000 mL | PREFILLED_SYRINGE | INTRAMUSCULAR | Status: AC
  Administered 2015-06-18: 0.5 mL via INTRAMUSCULAR
  Filled 2015-06-17 (×2): qty 0.5

## 2015-06-17 MED ORDER — SODIUM CHLORIDE 0.9 % IV BOLUS (SEPSIS)
500.0000 mL | Freq: Once | INTRAVENOUS | Status: AC
  Administered 2015-06-18: 500 mL via INTRAVENOUS

## 2015-06-17 MED ORDER — LEVOTHYROXINE SODIUM 100 MCG PO TABS
100.0000 ug | ORAL_TABLET | Freq: Every day | ORAL | Status: DC
  Administered 2015-06-18 – 2015-06-20 (×3): 100 ug via ORAL
  Filled 2015-06-17 (×3): qty 1

## 2015-06-17 MED ORDER — PROPRANOLOL HCL 10 MG PO TABS
10.0000 mg | ORAL_TABLET | Freq: Three times a day (TID) | ORAL | Status: DC
  Administered 2015-06-17 – 2015-06-20 (×7): 10 mg via ORAL
  Filled 2015-06-17 (×11): qty 1

## 2015-06-17 MED ORDER — MAGNESIUM SULFATE IN D5W 10-5 MG/ML-% IV SOLN
1.0000 g | Freq: Once | INTRAVENOUS | Status: AC
  Administered 2015-06-17: 1 g via INTRAVENOUS
  Filled 2015-06-17: qty 100

## 2015-06-17 MED ORDER — ACETAMINOPHEN 325 MG PO TABS
650.0000 mg | ORAL_TABLET | Freq: Four times a day (QID) | ORAL | Status: DC | PRN
  Administered 2015-06-17: 650 mg via ORAL
  Filled 2015-06-17: qty 2

## 2015-06-17 MED ORDER — BENZTROPINE MESYLATE 1 MG PO TABS
1.0000 mg | ORAL_TABLET | Freq: Every day | ORAL | Status: DC
  Administered 2015-06-17 – 2015-06-19 (×3): 1 mg via ORAL
  Filled 2015-06-17 (×3): qty 1

## 2015-06-17 MED ORDER — SODIUM CHLORIDE 0.9 % IV SOLN
Freq: Once | INTRAVENOUS | Status: AC
  Administered 2015-06-17: 1000 mL via INTRAVENOUS

## 2015-06-17 MED ORDER — SODIUM CHLORIDE 0.9 % IJ SOLN
10.0000 mL | INTRAMUSCULAR | Status: DC | PRN
  Administered 2015-06-17: 20 mL
  Administered 2015-06-18 – 2015-06-20 (×2): 10 mL
  Filled 2015-06-17 (×3): qty 40

## 2015-06-17 MED ORDER — ZOLPIDEM TARTRATE 5 MG PO TABS: 5.0000 mg | ORAL_TABLET | Freq: Every evening | ORAL | Status: DC | PRN

## 2015-06-17 MED ORDER — MORPHINE SULFATE 15 MG PO TABS: 15.0000 mg | ORAL_TABLET | Freq: Two times a day (BID) | ORAL | Status: DC | PRN

## 2015-06-17 MED ORDER — AMLODIPINE BESYLATE 5 MG PO TABS
5.0000 mg | ORAL_TABLET | Freq: Every day | ORAL | Status: DC
  Administered 2015-06-17 – 2015-06-20 (×3): 5 mg via ORAL
  Filled 2015-06-17 (×4): qty 1

## 2015-06-17 MED ORDER — ACETAMINOPHEN 650 MG RE SUPP: 650.0000 mg | Freq: Four times a day (QID) | RECTAL | Status: DC | PRN

## 2015-06-17 MED ORDER — PANTOPRAZOLE SODIUM 40 MG IV SOLR
40.0000 mg | Freq: Two times a day (BID) | INTRAVENOUS | Status: DC
  Administered 2015-06-17 – 2015-06-20 (×7): 40 mg via INTRAVENOUS
  Filled 2015-06-17 (×7): qty 40

## 2015-06-17 MED ORDER — SODIUM CHLORIDE 0.9 % IV SOLN
Freq: Once | INTRAVENOUS | Status: AC
  Administered 2015-06-17: 10:00:00 via INTRAVENOUS

## 2015-06-17 MED ORDER — SODIUM CHLORIDE 0.9 % IV SOLN
INTRAVENOUS | Status: DC
  Administered 2015-06-17 – 2015-06-18 (×3): via INTRAVENOUS

## 2015-06-17 MED ORDER — OXYCODONE HCL 5 MG PO TABS
5.0000 mg | ORAL_TABLET | ORAL | Status: DC | PRN
  Administered 2015-06-19: 5 mg via ORAL
  Filled 2015-06-17: qty 1

## 2015-06-17 MED ORDER — ALUM & MAG HYDROXIDE-SIMETH 200-200-20 MG/5ML PO SUSP
30.0000 mL | Freq: Four times a day (QID) | ORAL | Status: DC | PRN
Start: 2015-06-17 — End: 2015-06-20

## 2015-06-17 MED ORDER — ONDANSETRON HCL 4 MG PO TABS: 4.0000 mg | ORAL_TABLET | Freq: Four times a day (QID) | ORAL | Status: DC | PRN

## 2015-06-17 MED ORDER — RISPERIDONE 3 MG PO TABS
3.0000 mg | ORAL_TABLET | Freq: Every day | ORAL | Status: DC
  Administered 2015-06-17 – 2015-06-19 (×3): 3 mg via ORAL
  Filled 2015-06-17 (×3): qty 1

## 2015-06-17 MED ORDER — HYDROMORPHONE HCL 1 MG/ML IJ SOLN: 0.5000 mg | INTRAMUSCULAR | Status: DC | PRN

## 2015-06-17 MED ORDER — ACETAMINOPHEN 325 MG PO TABS
650.0000 mg | ORAL_TABLET | Freq: Once | ORAL | Status: AC
  Administered 2015-06-17: 650 mg via ORAL
  Filled 2015-06-17: qty 2

## 2015-06-17 MED ORDER — BOOST / RESOURCE BREEZE PO LIQD
1.0000 | Freq: Three times a day (TID) | ORAL | Status: DC
  Administered 2015-06-18 – 2015-06-20 (×5): 1 via ORAL

## 2015-06-17 MED ORDER — PROMETHAZINE HCL 25 MG/ML IJ SOLN: 12.5000 mg | Freq: Four times a day (QID) | INTRAMUSCULAR | Status: DC | PRN

## 2015-06-17 NOTE — ED Notes (Signed)
Dr. Zenia Resides is aware of HgB of 6.2.

## 2015-06-17 NOTE — H&P (Signed)
[facility] Admission History and Physical       [name] KXF:[id number] DOB: [birth year] DOA: 06/16/2015  Referring physician: EDP PCP: Myriam Jacobson, MD  Specialists:   Chief Complaint: Weakness  HPI: [name] is a 54 y.o. 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   She had bloodwork at the [facility] on 09/22 and had a hemoglobin of 7.5, and today her hemoglobin was 6.2.   She denies any hematemesis, hematochezia or melena, but she reports seeing hematuria.    She was referred for admission  Review of Systems: Constitutional: No Weight Loss, No Weight Gain, Night Sweats, Fevers, Chills, Dizziness, Light Headedness, Fatigue, +Generalized Weakness HEENT: No Headaches, Difficulty Swallowing,Tooth/Dental Problems,Sore Throat,  No Sneezing, Rhinitis, Ear Ache, Nasal Congestion, or Post Nasal Drip,  Cardio-vascular:  No Chest pain, Orthopnea, PND, Edema in Lower Extremities, Anasarca, Dizziness, Palpitations  Resp: No Dyspnea, No DOE, No Productive Cough, No Non-Productive Cough, No Hemoptysis, No Wheezing.    GI: No Heartburn, Indigestion, Abdominal Pain, Nausea, Vomiting, Diarrhea, Constipation, Hematemesis, Hematochezia, Melena, Change in Bowel Habits,  Loss of Appetite  GU: No Dysuria, + Hematuria, No Urgency or Urinary Frequency, No Flank pain.  Musculoskeletal: No Joint Pain or Swelling, No Decreased Range of Motion, No Back Pain.  Neurologic: No Syncope, No Seizures, Muscle Weakness, Paresthesia, Vision Disturbance or Loss, No Diplopia, No Vertigo, No Difficulty Walking,  Skin: No Rash or Lesions. Psych: No Change in Mood or Affect, No Depression or Anxiety, No Memory loss, No Confusion, or Hallucinations   Past Medical History  Diagnosis Date  . Mania ([place])   . Depression   . Thyroid disease   . Headache(784.0)   .  Bipolar disorder ([place])   . Liver cancer [facility])      Past Surgical History  Procedure Laterality Date  . Tubal ligation    . Dilation and evacuation N/A 08/21/2014    Procedure: DILATATION AND EVACUATION;  Surgeon: Lahoma Crocker, MD;  Location: [facility];  Service: Gynecology;  Laterality: N/A;      Prior to Admission medications   Medication Sig Start Date End Date Taking? Authorizing Provider  amLODipine (NORVASC) 5 MG tablet Take 5 mg by mouth daily. 01/30/15  Yes Historical Provider, MD  benztropine (COGENTIN) 1 MG tablet Take 1 tablet (1 mg total) by mouth at bedtime. 03/21/15  Yes Cloria Spring, MD  buPROPion (WELLBUTRIN XL) 300 MG 24 hr tablet Take 1 tablet (300 mg total) by mouth daily. 03/21/15  Yes Cloria Spring, MD  levothyroxine (SYNTHROID, LEVOTHROID) 88 MCG tablet Take 88 mcg by mouth at bedtime.  09/22/13  Yes Historical Provider, MD  lidocaine-prilocaine (EMLA) cream Apply 1 application topically as needed. Apply to port 1 hr before treatment as instructed. 10/13/14  Yes [name], MD  morphine (MSIR) 15 MG tablet Take 1 tablet (15 mg total) by mouth every 12 (twelve) hours as needed for moderate pain or severe pain. 03/16/15  Yes [name], MD  ondansetron (ZOFRAN) 4 MG tablet Take 1 tablet (4 mg total) by mouth 4 (four) times daily as needed for nausea or vomiting. 12/04/14  Yes [name], MD  prochlorperazine (COMPAZINE) 10 MG tablet TAKE ONE TABLET BY MOUTH EVERY 6 HOURS AS NEEDED FOR NAUSEA OR VOMITING 04/27/15  Yes Adrena E Johnson, PA-C  propranolol (INDERAL) 10 MG tablet Take 1 tablet (  10 mg total) by mouth 3 (three) times daily. 03/21/15  Yes Cloria Spring, MD  risperiDONE (RISPERDAL) 3 MG tablet Take 1 tablet (3 mg total) by mouth at bedtime. 03/21/15 03/20/16 Yes Cloria Spring, MD  oxyCODONE (OXY IR/ROXICODONE) 5 MG immediate release tablet Take 1 tablet (5 mg total) by mouth every 4 (four) hours as needed for severe pain. 11/24/14   [name], MD  zolpidem (AMBIEN) 10 MG tablet Take  1 tablet (10 mg total) by mouth at bedtime as needed for sleep. 03/21/15 09/19/15  Cloria Spring, MD     Allergies  Allergen Reactions  . Buspar [Buspirone] Other (See Comments)    More irritable and "goes off the handle"  . Cymbalta [Duloxetine Hcl] Other (See Comments)    Worked against her, husband can't remember exactly how  . Hydroxyzine Other (See Comments)    Doesn't work that well and causes constipation.  . Sertraline Other (See Comments)    Worked against her, but husband can not remember how so    Social History:  reports that she quit smoking about 25 years ago. Her smoking use included Cigarettes. She has never used smokeless tobacco. She reports that she does not drink alcohol or use illicit drugs.    Family History  Problem Relation Age of Onset  . OCD Mother   . Dementia Father   . Bipolar disorder Sister   . Drug abuse Sister   . Alcohol abuse Sister   . Anxiety disorder Neg Hx   . Depression Neg Hx   . Paranoid behavior Neg Hx   . Schizophrenia Neg Hx   . Seizures Neg Hx   . Sexual abuse Neg Hx   . Physical abuse Neg Hx   . ADD / ADHD Other        Physical Exam:  GEN:  Pleasant Thin  54 y.o. Caucasian female examined and in no acute distress; cooperative with exam Filed Vitals:   06/16/15 2330 06/17/15 0000 06/17/15 0030 06/17/15 0100  BP: 93/60 92/58 93/58  94/64  Pulse: 80 79 79 79  Temp:    99.5 F (37.5 C)  TempSrc:    Oral  Resp: 24 23 24 25   SpO2: 98% 96% 98% 97%   Blood pressure 94/64, pulse 79, temperature 99.5 F (37.5 C), temperature source Oral, resp. rate 25, SpO2 97 %. PSYCH: She is alert and oriented x4; does not appear anxious does not appear depressed; affect is normal HEENT: Normocephalic and Atraumatic, Mucous membranes pink; PERRLA; EOM intact; Fundi:  Benign;  No scleral icterus, Nares: Patent, Oropharynx: Clear, Fair Dentition,    Neck:  FROM, No Cervical Lymphadenopathy nor Thyromegaly or Carotid Bruit; No JVD; Breasts:: Not  examined CHEST WALL: No tenderness CHEST: Normal respiration, clear to auscultation bilaterally HEART: Regular rate and rhythm; no murmurs rubs or gallops BACK: No kyphosis or scoliosis; No CVA tenderness ABDOMEN: Positive Bowel Sounds, Scaphoid, Soft Non-Tender, No Rebound or Guarding; No Masses, No Organomegaly. Rectal Exam: Not done EXTREMITIES: No Cyanosis, Clubbing, or Edema; No Ulcerations. Genitalia: not examined PULSES: 2+ and symmetric SKIN: Normal hydration no rash or ulceration CNS:  Alert and Oriented x 4, No Focal Deficits Vascular: pulses palpable throughout    Labs on Admission:  Basic Metabolic Panel:  Recent Labs Lab 06/16/15 2334 06/16/15 2353  NA 134* 136  K 3.2* 3.1*  CL 100* 97*  CO2 24  --   GLUCOSE 113* 108*  BUN 17 19  CREATININE 0.86 0.80  CALCIUM 8.2*  --    Liver Function Tests:  Recent Labs Lab 06/16/15 2334  AST 30  ALT 17  ALKPHOS 113  BILITOT 1.0  PROT 5.5*  ALBUMIN 2.1*   No results for input(s): LIPASE, AMYLASE in the last 168 hours. No results for input(s): AMMONIA in the last 168 hours. CBC:  Recent Labs Lab 06/16/15 2334 06/16/15 2353  WBC 12.8*  --   NEUTROABS 10.2*  --   HGB 6.2* 8.2*  HCT 20.5* 24.0*  MCV 87.2  --   PLT 138*  --    Cardiac Enzymes: No results for input(s): CKTOTAL, CKMB, CKMBINDEX, TROPONINI in the last 168 hours.  BNP (last 3 results) No results for input(s): BNP in the last 8760 hours.  ProBNP (last 3 results) No results for input(s): PROBNP in the last 8760 hours.  CBG: No results for input(s): GLUCAP in the last 168 hours.  Radiological Exams on Admission: No results found.     Assessment/Plan:      54 y.o. female with  Principal Problem:   1.    Anemia   Transfusing 2 units Packed RBCs   Monitor H/Hs   FOBT daily x 3   Active Problems:   2.    Metastatic adenocarcinoma to liver with unknown primary site [facility])   Notify Oncology in AM     3.    Hypokalemia   Replace  with Oral KCl   Check magnesium and Replace PRN     4.    Thyroid disease   Continue Levothyroxine Rx   Check TSH Level     5.    Depression   Continue Wellbutrin Rx     6.    Bipolar disorder ([place])   Continue Risperdone Rx with Congentin     7.     DVT Prophylaxis   SCDs   Code Status:     FULL CODE       Family Communication:   Family at Bedside       Disposition Plan:    Inpatient Status        Time spent:  [place] [facility] Pager [phone]   If [place] Please Contact the Day Rounding Team MD for [facility]  If 7PM-7AM, Please Contact Night-Floor Coverage  [email or web address] Password [email or web address] 06/17/2015, 1:15 AM     ADDENDUM:   Patient was seen and examined on 06/17/2015

## 2015-06-17 NOTE — Progress Notes (Signed)
[facility] PROGRESS NOTE  [name] ASN:[id number] DOB: 07/16/1961 DOA: 06/16/2015 PCP: Myriam Jacobson, MD  Assessment/Plan: 1. Acute anemia -[name] having a history of metastatic adenocarcinoma to liver, presented with generalized weakness. She denied hematemesis, bright red blood per rectum or melanotic however did report hematuria. -She was typed and crossed and transfused 2 units of packed red blood cells with a posttransfusion H&H showing improvement in her hemoglobin to 9.0 and hematocrit 28.4.  2.  History of metastatic adenocarcinoma to liver from unknown primary site. -She is currently following Dr. Burr Medico of medical oncology at the cancer center. Restaging CT scan from 04/05/2015 showing stable disease with interval increase in the portal caval lymph nodes. -Plans have been made for her to start FOLFOX therapy in the upcoming weeks  3.  Hypothyroidism. -Lab showing TSH increased at 8.552 -Hypothyroidism could be contributed to her weakness, she had been on 88 g of levothyroxine will increase dose to 100 g by mouth daily. Will need a repeat TSH in 3-4 weeks.  4.  Hypokalemia. -Initial labs revealed a potassium of 3.1, improving to 4.4 with replacement.  5.  Hypomagnesemia. -Magnesium low at 1.6, will provide 1 g of IV magnesium -Repeat magnesium in a.m.   Code Status: Full code Family Communication: I spoke with her daughter was present at bedside Disposition Plan: Anticipate discharge home with medically stable   Consultants:    Procedures:    Antibiotics:    HPI/Subjective: [name] is a pleasant 54 year old female with a past medical history of metastatic adenocarcinoma to liver from unknown primary site, status post chemotherapy with carboplatin and paclitaxel that was stopped after 2 cycles due to disease progression, undergoing second line chemotherapy with cisplatin and gemcitabine. She presented to the emergency department  at [facility] with complaints of generalized weakness associated with nausea vomiting. Lab work revealing a hemoglobin of 6.2, previously 7.5 on 06/07/2015. She was admitted to the medicine service, typed and crossed and transfused a total of 2 units of packed red blood cells.  Objective: Filed Vitals:   06/17/15 1324  BP: 105/71  Pulse: 78  Temp: 99.7 F (37.6 C)  Resp: 18    Intake/Output Summary (Last 24 hours) at 06/17/15 1453 Last data filed at 06/17/15 0930  Gross per 24 hour  Intake    690 ml  Output      0 ml  Net    690 ml   Filed Weights   06/17/15 0223  Weight: 51.2 kg (112 lb 14 oz)    Exam:   General:  Ill-appearing, though no acute distress, awake and alert oriented.  Cardiovascular: Regular rate and rhythm normal S1-S2 no murmurs or gallops  Respiratory: Normal respiratory effort, lungs are clear to auscultation bilaterally  Abdomen: pain with palpation over right upper quadrant  Musculoskeletal: No edema  Data Reviewed: Basic Metabolic Panel:  Recent Labs Lab 06/16/15 2334 06/16/15 2353 06/17/15 1115  NA 134* 136 135  K 3.2* 3.1* 4.4  CL 100* 97* 106  CO2 24  --  23  GLUCOSE 113* 108* 93  BUN 17 19 15   CREATININE 0.86 0.80 0.62  CALCIUM 8.2*  --  7.9*  MG  --   --  1.6*   Liver Function Tests:  Recent Labs Lab 06/16/15 2334  AST 30  ALT 17  ALKPHOS 113  BILITOT 1.0  PROT 5.5*  ALBUMIN 2.1*   No results for input(s): LIPASE, AMYLASE in the last 168 hours.  No results for input(s): AMMONIA in the last 168 hours. CBC:  Recent Labs Lab 06/16/15 2334 06/16/15 2353 06/17/15 1115  WBC 12.8*  --  12.3*  NEUTROABS 10.2*  --   --   HGB 6.2* 8.2* 9.0*  HCT 20.5* 24.0* 28.4*  MCV 87.2  --  87.4  PLT 138*  --  131*   Cardiac Enzymes: No results for input(s): CKTOTAL, CKMB, CKMBINDEX, TROPONINI in the last 168 hours. BNP (last 3 results) No results for input(s): BNP in the last 8760 hours.  ProBNP (last 3 results) No  results for input(s): PROBNP in the last 8760 hours.  CBG: No results for input(s): GLUCAP in the last 168 hours.  No results found for this or any previous visit (from the past 240 hour(s)).   Studies: No results found.  Scheduled Meds: . sodium chloride   Intravenous Once  . amLODipine  5 mg Oral Daily  . benztropine  1 mg Oral QHS  . buPROPion  300 mg Oral Daily  . feeding supplement  1 Container Oral TID BM  . [START ON 06/18/2015] Influenza vac split quadrivalent PF  0.5 mL Intramuscular Tomorrow-1000  . levothyroxine  88 mcg Oral QHS  . pantoprazole (PROTONIX) IV  40 mg Intravenous Q12H  . propranolol  10 mg Oral TID  . risperiDONE  3 mg Oral QHS  . sodium chloride  3 mL Intravenous Q12H   Continuous Infusions: . sodium chloride 100 mL/hr at 06/17/15 1001    Principal Problem:   Anemia Active Problems:   Metastatic adenocarcinoma to liver with unknown primary site [facility])   Thyroid disease   Depression   Bipolar disorder ([place])   Hypokalemia    Time spent:     Kelvin Cellar  [facility] Pager [phone]. If 7PM-7AM, please contact night-coverage at [email or web address], password [facility] 06/17/2015, 2:53 PM  LOS: 0 days

## 2015-06-18 ENCOUNTER — Inpatient Hospital Stay (HOSPITAL_COMMUNITY): Payer: Medicare HMO

## 2015-06-18 DIAGNOSIS — N3000 Acute cystitis without hematuria: Secondary | ICD-10-CM | POA: Diagnosis present

## 2015-06-18 DIAGNOSIS — E039 Hypothyroidism, unspecified: Secondary | ICD-10-CM

## 2015-06-18 DIAGNOSIS — R509 Fever, unspecified: Secondary | ICD-10-CM

## 2015-06-18 DIAGNOSIS — E46 Unspecified protein-calorie malnutrition: Secondary | ICD-10-CM

## 2015-06-18 DIAGNOSIS — C787 Secondary malignant neoplasm of liver and intrahepatic bile duct: Secondary | ICD-10-CM

## 2015-06-18 DIAGNOSIS — N390 Urinary tract infection, site not specified: Secondary | ICD-10-CM

## 2015-06-18 DIAGNOSIS — N3001 Acute cystitis with hematuria: Secondary | ICD-10-CM | POA: Diagnosis not present

## 2015-06-18 DIAGNOSIS — C801 Malignant (primary) neoplasm, unspecified: Secondary | ICD-10-CM

## 2015-06-18 DIAGNOSIS — E440 Moderate protein-calorie malnutrition: Secondary | ICD-10-CM | POA: Diagnosis present

## 2015-06-18 DIAGNOSIS — D649 Anemia, unspecified: Secondary | ICD-10-CM

## 2015-06-18 LAB — CBC
HCT: 28.2 % — ABNORMAL LOW (ref 36.0–46.0)
Hemoglobin: 9 g/dL — ABNORMAL LOW (ref 12.0–15.0)
MCH: 28.2 pg (ref 26.0–34.0)
MCHC: 31.9 g/dL (ref 30.0–36.0)
MCV: 88.4 fL (ref 78.0–100.0)
PLATELETS: 121 10*3/uL — AB (ref 150–400)
RBC: 3.19 MIL/uL — ABNORMAL LOW (ref 3.87–5.11)
RDW: 15.9 % — AB (ref 11.5–15.5)
WBC: 12.6 10*3/uL — ABNORMAL HIGH (ref 4.0–10.5)

## 2015-06-18 LAB — TYPE AND SCREEN
ABO/RH(D): O POS
Antibody Screen: NEGATIVE
Unit division: 0
Unit division: 0

## 2015-06-18 LAB — LACTIC ACID, PLASMA: LACTIC ACID, VENOUS: 1.1 mmol/L (ref 0.5–2.0)

## 2015-06-18 LAB — BASIC METABOLIC PANEL
Anion gap: 6 (ref 5–15)
BUN: 14 mg/dL (ref 6–20)
CALCIUM: 7.8 mg/dL — AB (ref 8.9–10.3)
CO2: 22 mmol/L (ref 22–32)
CREATININE: 0.7 mg/dL (ref 0.44–1.00)
Chloride: 108 mmol/L (ref 101–111)
GFR calc Af Amer: >60 mL/min (ref >60)
GFR calc non Af Amer: >60 mL/min (ref >60)
GLUCOSE: 93 mg/dL (ref 65–99)
Potassium: 4.5 mmol/L (ref 3.5–5.1)
Sodium: 136 mmol/L (ref 135–145)

## 2015-06-18 LAB — MAGNESIUM: Magnesium: 1.8 mg/dL (ref 1.7–2.4)

## 2015-06-18 MED ORDER — IOHEXOL 300 MG/ML  SOLN
100.0000 mL | Freq: Once | INTRAMUSCULAR | Status: AC | PRN
  Administered 2015-06-18: 100 mL via INTRAVENOUS

## 2015-06-18 MED ORDER — IOHEXOL 300 MG/ML  SOLN
25.0000 mL | INTRAMUSCULAR | Status: AC
  Administered 2015-06-18: 25 mL via ORAL

## 2015-06-18 NOTE — Progress Notes (Signed)
[facility] PROGRESS NOTE  [name] YYT:[id number] DOB: Feb 08, 1961 DOA: 06/16/2015 PCP: Myriam Jacobson, MD  Assessment/Plan: 1. Acute anemia -[name] having a history of metastatic adenocarcinoma to liver, presented with generalized weakness. She denied hematemesis, bright red blood per rectum or melanotic however did report hematuria. -Likely secondary to metastatic cancer -She was typed and crossed and transfused 2 units of packed red blood cells with a posttransfusion H&H showing improvement in her hemoglobin. on 06/18/2015 hemoglobin remained stable at 9.0 and hematocrit of 28.2.  2.  History of metastatic adenocarcinoma to liver from unknown primary site. -She is currently following Dr. Burr Hill of medical oncology at the cancer center. Restaging CT scan from 04/05/2015 showing stable disease with interval increase in the portal caval lymph nodes. -Plans have been made for her to start FOLFOX therapy in the upcoming weeks -Medical oncology recommending obtaining a restaging CT scan of chest abdomen and pelvis with IV contrast during this hospitalization. Studies have been ordered.  3.  Hypothyroidism. -Lab showing TSH increased at 8.552 -Hypothyroidism could be contributed to her weakness, she had been on 88 g of levothyroxine increased dose to 100 g by mouth daily. Will need a repeat TSH in 3-4 weeks.  4.  Hypokalemia. -Initial labs revealed a potassium of 3.1, improving to 4.4 with replacement.  5.  Hypomagnesemia. -Magnesium low at 1.6, will provide 1 g of IV magnesium -Repeat magnesium at 1.8 after replacement.  6.  Urinary tract infection -A urinalysis on 06/17/2015 showing the presence of many bacteria and nitrates. -MAXIMUM TEMPERATURE of 101 in the past 24 hours, has been afebrile over the course of the day. -We will continue ceftriaxone 1 g IV every 20 filed, follow-up on urine cultures. Medical oncology recommending blood cultures.   Code  Status: Full code Family Communication: I spoke with her daughter was present at bedside Disposition Plan: Anticipate discharge home with medically stable   Consultants: Medical oncology  Antibiotics:  Ceftriaxone 1 g IV every 24 hours  HPI/Subjective: [name] is a pleasant 54 year old female with a past medical history of metastatic adenocarcinoma to liver from unknown primary site, status post chemotherapy with carboplatin and paclitaxel that was stopped after 2 cycles due to disease progression, undergoing second line chemotherapy with cisplatin and gemcitabine. She presented to the emergency department at [facility] with complaints of generalized weakness associated with nausea vomiting. Lab work revealing a hemoglobin of 6.2, previously 7.5 on 06/07/2015. She was admitted to the medicine service, typed and crossed and transfused a total of 2 units of packed red blood cells.  Objective: Filed Vitals:   06/18/15 1325  BP: 93/64  Pulse: 76  Temp: 98.2 F (36.8 C)  Resp: 16    Intake/Output Summary (Last 24 hours) at 06/18/15 1644 Last data filed at 06/18/15 1300  Gross per 24 hour  Intake   2990 ml  Output   1050 ml  Net   1940 ml   Filed Weights   06/17/15 0223  Weight: 51.2 kg (112 lb 14 oz)    Exam:   General:  Ill-appearing, though no acute distress, awake and alert oriented.  Cardiovascular: Regular rate and rhythm normal S1-S2 no murmurs or gallops  Respiratory: Normal respiratory effort, lungs are clear to auscultation bilaterally  Abdomen: pain with palpation over right upper quadrant  Musculoskeletal: No edema  Data Reviewed: Basic Metabolic Panel:  Recent Labs Lab 06/16/15 2334 06/16/15 2353 06/17/15 1115 06/18/15 0035  NA 134* 136 135 136  K 3.2* 3.1* 4.4 4.5  CL 100* 97* 106 108  CO2 24  --  23 22  GLUCOSE 113* 108* 93 93  BUN 17 19 15 14   CREATININE 0.86 0.80 0.62 0.70  CALCIUM 8.2*  --  7.9* 7.8*  MG  --   --  1.6* 1.8    Liver Function Tests:  Recent Labs Lab 06/16/15 2334  AST 30  ALT 17  ALKPHOS 113  BILITOT 1.0  PROT 5.5*  ALBUMIN 2.1*   No results for input(s): LIPASE, AMYLASE in the last 168 hours. No results for input(s): AMMONIA in the last 168 hours. CBC:  Recent Labs Lab 06/16/15 2334 06/16/15 2353 06/17/15 1115 06/18/15 0035  WBC 12.8*  --  12.3* 12.6*  NEUTROABS 10.2*  --   --   --   HGB 6.2* 8.2* 9.0* 9.0*  HCT 20.5* 24.0* 28.4* 28.2*  MCV 87.2  --  87.4 88.4  PLT 138*  --  131* 121*   Cardiac Enzymes: No results for input(s): CKTOTAL, CKMB, CKMBINDEX, TROPONINI in the last 168 hours. BNP (last 3 results) No results for input(s): BNP in the last 8760 hours.  ProBNP (last 3 results) No results for input(s): PROBNP in the last 8760 hours.  CBG: No results for input(s): GLUCAP in the last 168 hours.  Recent Results (from the past 240 hour(s))  Urine culture     Status: None (Preliminary result)   Collection Time: 06/17/15 11:30 AM  Result Value Ref Range Status   Specimen Description URINE, CLEAN CATCH  Final   Special Requests NONE  Final   Culture   Final    CULTURE REINCUBATED FOR BETTER GROWTH Performed at [facility]    Report Status PENDING  Incomplete     Studies: No results found.  Scheduled Meds: . amLODipine  5 mg Oral Daily  . benztropine  1 mg Oral QHS  . buPROPion  300 mg Oral Daily  . cefTRIAXone (ROCEPHIN)  IV  1 g Intravenous Q24H  . feeding supplement  1 Container Oral TID BM  . levothyroxine  100 mcg Oral QAC breakfast  . pantoprazole (PROTONIX) IV  40 mg Intravenous Q12H  . propranolol  10 mg Oral TID  . risperiDONE  3 mg Oral QHS  . sodium chloride  3 mL Intravenous Q12H   Continuous Infusions:    Principal Problem:   Anemia Active Problems:   Metastatic adenocarcinoma to liver with unknown primary site [facility])   Thyroid disease   Depression   Bipolar disorder ([place])   Hypokalemia   Absolute anemia   Malnutrition of  moderate degree    Time spent: 35 min     [name], [place] [facility] Pager [phone]. If 7PM-7AM, please contact night-coverage at [email or web address], password [facility] 06/18/2015, 4:44 PM  LOS: 1 day

## 2015-06-18 NOTE — Progress Notes (Signed)
Initial Nutrition Assessment  DOCUMENTATION CODES:   Non-severe (moderate) malnutrition in context of chronic illness  INTERVENTION:  - Continue Boost Breeze TID, each supplement provides 250 kcal and 9 grams of protein - Encourage PO intakes as tolerated - Diet advancement as medically feasible - RD will continue to monitor for needs  NUTRITION DIAGNOSIS:   Inadequate oral intake related to acute illness, nausea, vomiting as evidenced by per patient/family report.  GOAL:   Patient will meet greater than or equal to 90% of their needs  MONITOR:   PO intake, Supplement acceptance, Weight trends, Labs, I & O's  REASON FOR ASSESSMENT:   Malnutrition Screening Tool  ASSESSMENT:   54 y.o. 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 She had bloodwork at the [place] on 09/22 and had a hemoglobin of 7.5, and today her hemoglobin was 6.2. She denies any hematemesis, hematochezia or melena, but she reports seeing hematuria.  Pt seen for MST. BMI indicates normal weight status. Pt on CLD but denies intakes this AM; noted several bottles of soda on bedside table. Pt reports she has never had a significant appetite but that it has been declining and has further declined over the past 1 week. She associates nausea and vomiting and sensitivity to food smells over the past 1 week with decrease in intakes. Pt thinks the last episode of emesis occurred Tuesday (9/27). She denies feeling nauseous or having abdominal pain this AM.   Pt states UBW of 110-115 lbs and states her weight has been fluctuating recently. Per chart review, pt has lost 12 lbs (9.7% body weight) int he past 3 months which is significant for time frame. Mild muscle and fat wasting noted.   Not meeting needs. Boost Breeze has been ordered TID. Will monitor for diet advancement and ability to switch  to Ensure Enlive due to high protein content of this supplement. Medications reviewed. Labs reviewed; Ca: 7.8 mg/dL.   Diet Order:  Diet clear liquid Room service appropriate?: Yes; Fluid consistency:: Thin  Skin:  Reviewed, no issues  Last BM:  9/26  Height:   Ht Readings from Last 1 Encounters:  06/17/15 5\' 4"  (1.626 m)    Weight:   Wt Readings from Last 1 Encounters:  06/17/15 112 lb 14 oz (51.2 kg)    Ideal Body Weight:  54.54 kg (kg)  BMI:  Body mass index is 19.37 kg/(m^2).  Estimated Nutritional Needs:   Kcal:  1550-1800  Protein:  70-80 grams  Fluid:  2.2 L/day  EDUCATION NEEDS:   No education needs identified at this time     Jarome Matin, RD, LDN Inpatient Clinical Dietitian Pager # [phone] After hours/weekend pager # [phone]

## 2015-06-18 NOTE — Progress Notes (Signed)
[name]   DOB:Nov 21, 1960   UY#:[id number]   DGL#:[id number]  Subjective: Patient is well-known to me, was admitted to [facility] the weekend for worsening symptomatic anemia. She received 2 units of RBC, and felt much better. Patient also has intermittent low-grade fever, no dysuria or productive cough. Patient states her urine has been dark for the past week.   Objective:  Filed Vitals:   06/18/15 1325  BP: 93/64  Pulse: 76  Temp: 98.2 F (36.8 C)  Resp: 16    Body mass index is 19.37 kg/(m^2).  Intake/Output Summary (Last 24 hours) at 06/18/15 1505 Last data filed at 06/18/15 1300  Gross per 24 hour  Intake   2690 ml  Output   1050 ml  Net   1640 ml     Sclerae unicteric  Oropharynx clear  No peripheral adenopathy  Lungs clear -- no rales or rhonchi  Heart regular rate and rhythm  Abdomen benign  MSK no focal spinal tenderness, no peripheral edema  Neuro nonfocal   CBG (last 3)  No results for input(s): GLUCAP in the last 72 hours.   Labs:  Lab Results  Component Value Date   WBC 12.6* 06/18/2015   HGB 9.0* 06/18/2015   HCT 28.2* 06/18/2015   MCV 88.4 06/18/2015   PLT 121* 06/18/2015   NEUTROABS 10.2* 06/16/2015    @LASTCHEMISTRY @  Urine Studies No results for input(s): UHGB, CRYS in the last 72 hours.  Invalid input(s): UACOL, UAPR, USPG, UPH, UTP, UGL, UKET, UBIL, UNIT, UROB, [place], UEPI, UWBC, [place], [place]  Basic Metabolic Panel:  Recent Labs Lab 06/16/15 2334 06/16/15 2353 06/17/15 1115 06/18/15 0035  NA 134* 136 135 136  K 3.2* 3.1* 4.4 4.5  CL 100* 97* 106 108  CO2 24  --  23 22  GLUCOSE 113* 108* 93 93  Hill 17 19 15 14   CREATININE 0.86 0.80 0.62 0.70  CALCIUM 8.2*  --  7.9* 7.8*  MG  --   --  1.6* 1.8   GFR Estimated Creatinine Clearance: 66.5 mL/min (by C-G formula based on Cr of 0.7). Liver Function Tests:  Recent Labs Lab 06/16/15 2334  AST 30  ALT 17  ALKPHOS 113  BILITOT 1.0  PROT 5.5*  ALBUMIN  2.1*   No results for input(s): LIPASE, AMYLASE in the last 168 hours. No results for input(s): AMMONIA in the last 168 hours. Coagulation profile No results for input(s): INR, PROTIME in the last 168 hours.  CBC:  Recent Labs Lab 06/16/15 2334 06/16/15 2353 06/17/15 1115 06/18/15 0035  WBC 12.8*  --  12.3* 12.6*  NEUTROABS 10.2*  --   --   --   HGB 6.2* 8.2* 9.0* 9.0*  HCT 20.5* 24.0* 28.4* 28.2*  MCV 87.2  --  87.4 88.4  PLT 138*  --  131* 121*   Cardiac Enzymes: No results for input(s): CKTOTAL, CKMB, CKMBINDEX, TROPONINI in the last 168 hours. BNP: Invalid input(s): POCBNP CBG: No results for input(s): GLUCAP in the last 168 hours. D-Dimer No results for input(s): DDIMER in the last 72 hours. Hgb A1c No results for input(s): HGBA1C in the last 72 hours. Lipid Profile No results for input(s): CHOL, HDL, LDLCALC, TRIG, CHOLHDL, LDLDIRECT in the last 72 hours. Thyroid function studies  Recent Labs  06/17/15 1115  TSH 8.552*   Anemia work up No results for input(s): VITAMINB12, FOLATE, FERRITIN, TIBC, IRON, RETICCTPCT in the last 72 hours. Microbiology Recent Results (from the  past 240 hour(s))  Urine culture     Status: None (Preliminary result)   Collection Time: 06/17/15 11:30 AM  Result Value Ref Range Status   Specimen Description URINE, CLEAN CATCH  Final   Special Requests NONE  Final   Culture   Final    CULTURE REINCUBATED FOR BETTER GROWTH Performed at [facility]    Report Status PENDING  Incomplete      Studies:  No results found.  Assessment: 54 y.o.   1. Symptomatic anemia, no signs of bleeding, possible related to her underlying metastatic cancer 2. 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 3. Hypothyroidism 4. History of malignant ascites 5. Malnutrition. 6. Possible UTI  Plan:  -I agree with RBC transfusion, she tolerated well and feels better after blood transfusion -Her urine culture is pending,  giving the fever, started elevated white count, and abnormal UA, I agree with IV antibiotics. She is currently on Rocephin -Please obtain blood culture also, especially when she spikes fever. -her anemia is probably related to her cancer progression, I would appreciate it if we can get a restaging CT with IV contrast when she is in the hospital, to understand the disease burden and prognosis better. If not feasible, I will schedule it after her discharge  -I called her daughter [name] after my visit with her, she has been her main Scientist, research (medical). I tried to address the goal of care and DNR/DNI. In big picture, she understands the poor prognosis very well, pt recently lost her husband one month ago, who was under hospice care at the end.[name]'s baby suffered brain injury after her birth, she and her family has been through a lot lately. She will discuss with pt and her sister about his also.  -She is scheduled to start second line chemotherapy this Friday, I'll wait and see how she does in the next few days, and decide if I need to postpone her chemotherapy.  I will follow up with her.     [name], MD 06/18/2015  3:05 PM

## 2015-06-19 DIAGNOSIS — N3001 Acute cystitis with hematuria: Principal | ICD-10-CM

## 2015-06-19 LAB — CBC
HCT: 28.2 % — ABNORMAL LOW (ref 36.0–46.0)
HEMOGLOBIN: 8.8 g/dL — AB (ref 12.0–15.0)
MCH: 27.3 pg (ref 26.0–34.0)
MCHC: 31.2 g/dL (ref 30.0–36.0)
MCV: 87.6 fL (ref 78.0–100.0)
Platelets: 104 10*3/uL — ABNORMAL LOW (ref 150–400)
RBC: 3.22 MIL/uL — ABNORMAL LOW (ref 3.87–5.11)
RDW: 15.9 % — ABNORMAL HIGH (ref 11.5–15.5)
WBC: 12.5 10*3/uL — ABNORMAL HIGH (ref 4.0–10.5)

## 2015-06-19 LAB — BASIC METABOLIC PANEL
ANION GAP: 7 (ref 5–15)
BUN: 15 mg/dL (ref 6–20)
CALCIUM: 7.9 mg/dL — AB (ref 8.9–10.3)
CO2: 22 mmol/L (ref 22–32)
Chloride: 106 mmol/L (ref 101–111)
Creatinine, Ser: 0.67 mg/dL (ref 0.44–1.00)
Glucose, Bld: 96 mg/dL (ref 65–99)
Potassium: 4.3 mmol/L (ref 3.5–5.1)
Sodium: 135 mmol/L (ref 135–145)

## 2015-06-19 MED ORDER — BISACODYL 10 MG RE SUPP: 10.0000 mg | Freq: Every day | RECTAL | Status: DC | PRN

## 2015-06-19 MED ORDER — SORBITOL 70 % SOLN
960.0000 mL | TOPICAL_OIL | Freq: Once | ORAL | Status: DC
  Filled 2015-06-19: qty 240

## 2015-06-19 NOTE — Progress Notes (Signed)
Patient is a&ox4, instructions reviewed. Gait steady. Questions, concerns denied r/t dc. Inhaler provided as well as O2 tank. Attempt made to schedule f/u visit, vm message left for [employer] [phone]

## 2015-06-19 NOTE — Progress Notes (Signed)
[facility] PROGRESS NOTE  CAMIELLE SIZER IWO:[id number] DOB: 12-Apr-1961 DOA: 06/16/2015 PCP: Myriam Jacobson, MD  Mrs. Roehrich is a pleasant 53 year old female with a past medical history of metastatic adenocarcinoma to liver from unknown primary site, status post chemotherapy with carboplatin and paclitaxel that was stopped after 2 cycles due to disease progression, undergoing second line chemotherapy with cisplatin and gemcitabine. She presented to the emergency department at [facility] with complaints of generalized weakness associated with nausea vomiting. Lab work revealing a hemoglobin of 6.2, previously 7.5 on 06/07/2015. She was admitted to the medicine service, typed and crossed and transfused a total of 2 units of packed red blood cells.  Fever of 101- treating UTI-- await urine and blood cultures  Assessment/Plan: Acute anemia - history of metastatic adenocarcinoma to liver, presented with generalized weakness. She denied hematemesis, bright red blood per rectum or melanotic however did report hematuria. -Likely secondary to metastatic cancer vs UTI -She was typed and crossed and transfused 2 units of packed red blood cells with a posttransfusion H&H showing improvement in her hemoglobin. on 06/18/2015 hemoglobin remained stable at 9.0 and hematocrit of 28.2.  History of metastatic adenocarcinoma to liver from unknown primary site. -following with Dr. Burr Medico of medical oncology at the cancer center. Restaging CT scan from 04/05/2015 showing stable disease with interval increase in the portal caval lymph nodes. -Plans have been made for her to start FOLFOX therapy in the upcoming weeks -Medical oncology recommending obtaining a restaging CT scan of chest abdomen and pelvis with IV contrast during this hospitalization-- will defer to oncology giving results to patient   Hypothyroidism. -Lab showing TSH increased at 8.552 -Hypothyroidism could be contributed to her  weakness, she had been on 88 g of levothyroxine increased dose to 100 g by mouth daily. Will need a repeat TSH in 3-4 weeks.  Hypokalemia. -Initial labs revealed a potassium of 3.1, improved with replacement.  Hypomagnesemia. -Magnesium low at 1.6 given 1 g of IV magnesium  Urinary tract infection -A urinalysis on 06/17/2015 showing the presence of many bacteria and nitrates. -We will continue ceftriaxone 1 g IV every 20 filed, follow-up on urine cultures.  - blood cultures drawn 10/3   Code Status: Full code Family Communication: son at bedsde Disposition Plan: Anticipate discharge home with medically stable   Consultants: Medical oncology- Feng  Antibiotics:  Ceftriaxone 1 g IV every 24 hours  HPI/Subjective: some nausea Wanting to go home    Objective: Filed Vitals:   06/19/15 0516  BP: 105/75  Pulse:   Temp: 98.8 F (37.1 C)  Resp: 20    Intake/Output Summary (Last 24 hours) at 06/19/15 0834 Last data filed at 06/19/15 0700  Gross per 24 hour  Intake   1270 ml  Output   1150 ml  Net    120 ml   Filed Weights   06/17/15 0223  Weight: 51.2 kg (112 lb 14 oz)    Exam:   General:  Ill-appearing, though no acute distress, awake and alert oriented.  Cardiovascular: Regular rate and rhythm normal S1-S2 no murmurs or gallops  Respiratory: Normal respiratory effort, lungs are clear to auscultation bilaterally  Abdomen: pain with palpation over right upper quadrant-- DECREASED bowel sounds  Musculoskeletal: No edema  Data Reviewed: Basic Metabolic Panel:  Recent Labs Lab 06/16/15 2334 06/16/15 2353 06/17/15 1115 06/18/15 0035 06/19/15 0447  NA 134* 136 135 136 135  K 3.2* 3.1* 4.4 4.5 4.3  CL 100* 97* 106 108  106  CO2 24  --  23 22 22   GLUCOSE 113* 108* 93 93 96  BUN 17 19 15 14 15   CREATININE 0.86 0.80 0.62 0.70 0.67  CALCIUM 8.2*  --  7.9* 7.8* 7.9*  MG  --   --  1.6* 1.8  --    Liver Function Tests:  Recent Labs Lab  06/16/15 2334  AST 30  ALT 17  ALKPHOS 113  BILITOT 1.0  PROT 5.5*  ALBUMIN 2.1*   No results for input(s): LIPASE, AMYLASE in the last 168 hours. No results for input(s): AMMONIA in the last 168 hours. CBC:  Recent Labs Lab 06/16/15 2334 06/16/15 2353 06/17/15 1115 06/18/15 0035 06/19/15 0447  WBC 12.8*  --  12.3* 12.6* 12.5*  NEUTROABS 10.2*  --   --   --   --   HGB 6.2* 8.2* 9.0* 9.0* 8.8*  HCT 20.5* 24.0* 28.4* 28.2* 28.2*  MCV 87.2  --  87.4 88.4 87.6  PLT 138*  --  131* 121* 104*   Cardiac Enzymes: No results for input(s): CKTOTAL, CKMB, CKMBINDEX, TROPONINI in the last 168 hours. BNP (last 3 results) No results for input(s): BNP in the last 8760 hours.  ProBNP (last 3 results) No results for input(s): PROBNP in the last 8760 hours.  CBG: No results for input(s): GLUCAP in the last 168 hours.  Recent Results (from the past 240 hour(s))  Urine culture     Status: None (Preliminary result)   Collection Time: 06/17/15 11:30 AM  Result Value Ref Range Status   Specimen Description URINE, CLEAN CATCH  Final   Special Requests NONE  Final   Culture   Final    CULTURE REINCUBATED FOR BETTER GROWTH Performed at [facility]    Report Status PENDING  Incomplete     Studies: Ct Chest W Contrast  06/18/2015   CLINICAL DATA:  Metastatic adenocarcinoma to the liver from unknown primary. History of cirrhosis. Subsequent encounter.  EXAM: CT CHEST, ABDOMEN, AND PELVIS WITH CONTRAST  TECHNIQUE: Multidetector CT imaging of the chest, abdomen and pelvis was performed following the standard protocol during bolus administration of intravenous contrast.  CONTRAST:  168mL OMNIPAQUE IOHEXOL 300 MG/ML  SOLN  COMPARISON:  CT 04/05/2015 and 02/19/2015.  FINDINGS: CT CHEST FINDINGS  Mediastinum/Nodes: Small left supraclavicular lymph nodes have increased in size and number, measuring up to 8 mm on image 4. No enlarged mediastinal, hilar or axillary lymph nodes identified. There  is stable calcification in the left thyroid lobe. The trachea and esophagus demonstrate no significant findings. The heart size is normal. There is no pericardial effusion. Right IJ Port-A-Cath tip is unchanged near the SVC right atrial junction.  Lungs/Pleura: There are new small dependent pleural effusions bilaterally which appear simple.There is new dependent atelectasis at both lung bases, but no confluent airspace opacity or suspicious pulmonary nodule. Biapical scarring appears unchanged.  Musculoskeletal/Chest wall: No chest wall mass or suspicious osseous findings. There is stable focal disc space loss at T10-11.  CT ABDOMEN AND PELVIS FINDINGS  Hepatobiliary: There is significantly progressive multifocal hepatic metastatic disease. The largest lesion posteriorly in the right hepatic lobe measures 5.2 x 4.8 cm on image 39 (previously 3.1 x 3.3 cm). There are multiple new lesions and all segments of the liver are involved. There is diffuse gallbladder wall edema. No calcified gallstone or biliary dilatation.  Pancreas: The previously noted small low-density lesion in the pancreatic tail is not as well seen, although grossly stable  in size on image number 60. There is no evidence of pancreatic soft tissue mass or ductal dilatation.  Spleen: The spleen is mildly enlarged, measuring 14.0 x 6.4 x 14.8 cm. There are multiple new predominantly peripheral and wedge-shaped low-density lesions in the spleen, most consistent with infarcts. No definite splenic metastases suspected.  Adrenals/Urinary Tract: Both adrenal glands appear normal. The kidneys appear normal without evidence of urinary tract calculus, suspicious lesion or hydronephrosis. No bladder abnormalities are seen.  Stomach/Bowel: No bowel distension, focal wall thickening or mucosal lesion identified.  Vascular/Lymphatic: There are several enlarging lymph nodes within the porta hepatis, the largest measuring 4.9 x 3.3 cm on image 60 (previously 4.2 x 2.4  cm). No significant atherosclerosis. There are some splenic and left gonadal vein varices.  Reproductive: The uterus appears normal without residual endometrial thickening. There is no adnexal mass.  Other: Patient has developed a moderate amount of ascites. No peritoneal nodularity observed to strongly suggest peritoneal carcinomatosis.  Musculoskeletal: No acute or significant osseous findings.  IMPRESSION: 1. Progressive multifocal hepatic metastatic disease. 2. There is also progressive lymphadenopathy in the porta hepatis and left supraclavicular area, worrisome for metastatic disease. 3. Interval development of ascites without definite peritoneal nodularity. 4. 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   Electronically Signed   By: Richardean Sale M.D.   On: 06/18/2015 20:56   Ct Abdomen Pelvis W Contrast  06/18/2015   CLINICAL DATA:  Metastatic adenocarcinoma to the liver from unknown primary. History of cirrhosis. Subsequent encounter.  EXAM: CT CHEST, ABDOMEN, AND PELVIS WITH CONTRAST  TECHNIQUE: Multidetector CT imaging of the chest, abdomen and pelvis was performed following the standard protocol during bolus administration of intravenous contrast.  CONTRAST:  143mL OMNIPAQUE IOHEXOL 300 MG/ML  SOLN  COMPARISON:  CT 04/05/2015 and 02/19/2015.  FINDINGS: CT CHEST FINDINGS  Mediastinum/Nodes: Small left supraclavicular lymph nodes have increased in size and number, measuring up to 8 mm on image 4. No enlarged mediastinal, hilar or axillary lymph nodes identified. There is stable calcification in the left thyroid lobe. The trachea and esophagus demonstrate no significant findings. The heart size is normal. There is no pericardial effusion. Right IJ Port-A-Cath tip is unchanged near the SVC right atrial junction.  Lungs/Pleura: There are new small dependent pleural effusions bilaterally which appear simple.There is new dependent atelectasis at both lung bases, but no confluent airspace  opacity or suspicious pulmonary nodule. Biapical scarring appears unchanged.  Musculoskeletal/Chest wall: No chest wall mass or suspicious osseous findings. There is stable focal disc space loss at T10-11.  CT ABDOMEN AND PELVIS FINDINGS  Hepatobiliary: There is significantly progressive multifocal hepatic metastatic disease. The largest lesion posteriorly in the right hepatic lobe measures 5.2 x 4.8 cm on image 39 (previously 3.1 x 3.3 cm). There are multiple new lesions and all segments of the liver are involved. There is diffuse gallbladder wall edema. No calcified gallstone or biliary dilatation.  Pancreas: The previously noted small low-density lesion in the pancreatic tail is not as well seen, although grossly stable in size on image number 60. There is no evidence of pancreatic soft tissue mass or ductal dilatation.  Spleen: The spleen is mildly enlarged, measuring 14.0 x 6.4 x 14.8 cm. There are multiple new predominantly peripheral and wedge-shaped low-density lesions in the spleen, most consistent with infarcts. No definite splenic metastases suspected.  Adrenals/Urinary Tract: Both adrenal glands appear normal. The kidneys appear normal without evidence of urinary tract calculus, suspicious lesion or hydronephrosis. No bladder  abnormalities are seen.  Stomach/Bowel: No bowel distension, focal wall thickening or mucosal lesion identified.  Vascular/Lymphatic: There are several enlarging lymph nodes within the porta hepatis, the largest measuring 4.9 x 3.3 cm on image 60 (previously 4.2 x 2.4 cm). No significant atherosclerosis. There are some splenic and left gonadal vein varices.  Reproductive: The uterus appears normal without residual endometrial thickening. There is no adnexal mass.  Other: Patient has developed a moderate amount of ascites. No peritoneal nodularity observed to strongly suggest peritoneal carcinomatosis.  Musculoskeletal: No acute or significant osseous findings.  IMPRESSION: 1.  Progressive multifocal hepatic metastatic disease. 2. There is also progressive lymphadenopathy in the porta hepatis and left supraclavicular area, worrisome for metastatic disease. 3. Interval development of ascites without definite peritoneal nodularity. 4. 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   Electronically Signed   By: Richardean Sale M.D.   On: 06/18/2015 20:56    Scheduled Meds: . amLODipine  5 mg Oral Daily  . benztropine  1 mg Oral QHS  . buPROPion  300 mg Oral Daily  . cefTRIAXone (ROCEPHIN)  IV  1 g Intravenous Q24H  . feeding supplement  1 Container Oral TID BM  . levothyroxine  100 mcg Oral QAC breakfast  . pantoprazole (PROTONIX) IV  40 mg Intravenous Q12H  . propranolol  10 mg Oral TID  . risperiDONE  3 mg Oral QHS  . sodium chloride  3 mL Intravenous Q12H   Continuous Infusions:    Principal Problem:   Anemia Active Problems:   Metastatic adenocarcinoma to liver with unknown primary site [facility])   Thyroid disease   Depression   Bipolar disorder (HCC)   Hypokalemia   Absolute anemia   Malnutrition of moderate degree   Acute cystitis without hematuria    Time spent: 25 min     Lunabelle Oatley  [facility] Pager [phone]. If 7PM-7AM, please contact night-coverage at [email or web address], password [facility] 06/19/2015, 8:34 AM  LOS: 2 days

## 2015-06-19 NOTE — Progress Notes (Signed)
Patient has not had Bowel Movement since 06/13/15. Abdomen is distended, bowel sounds are slow (hypoactive). M.D. Contacted, order received for enema. Pt made aware of plan of care, patient request to have at bedtime if able

## 2015-06-19 NOTE — Care Management Important Message (Signed)
Important Message  Patient Details  Name: LADEANA LAPLANT MRN: [id number] Date of Birth: Aug 19, 1961   Medicare Important Message Given:  Yes-second notification given    Camillo Flaming 06/19/2015, 12:12 [place] Message  Patient Details  Name: MADELENE KAATZ MRN: [id number] Date of Birth: [birth year]   Medicare Important Message Given:  Yes-second notification given    Camillo Flaming 06/19/2015, 12:12 PM

## 2015-06-19 NOTE — Evaluation (Signed)
Physical Therapy One Time Evaluation Patient Details Name: [name] MRN: [id number] DOB: Aug 18, 1961 Today's Date: 06/19/2015   History of Present Illness  54 year old female with a past medical history of metastatic adenocarcinoma to liver from unknown primary site, status post chemotherapy with carboplatin and paclitaxel that was stopped after 2 cycles due to disease progression, undergoing second line chemotherapy with cisplatin and gemcitabine.  Pt admitted 06/16/15 for acute anemia, s/p 2 units PRBCs  Clinical Impression  Patient evaluated by Physical Therapy with no further acute PT needs identified. All education has been completed and the patient has no further questions.  Pt mobilizing slowly but no deficits identified.  Pt encouraged to ambulate on her own during acute stay (RN and NT aware).  See below for any follow-up Physical Therapy or equipment needs. PT is signing off. Thank you for this referral.     Follow Up Recommendations No PT follow up    Equipment Recommendations  None recommended by PT    Recommendations for Other Services       Precautions / Restrictions Precautions Precautions: None      Mobility  Bed Mobility Overal bed mobility: Modified Independent                Transfers Overall transfer level: Modified independent                  Ambulation/Gait Ambulation/Gait assistance: Modified independent (Device/Increase time) Ambulation Distance (Feet): 320 Feet Assistive device: None Gait Pattern/deviations: Step-through pattern     General Gait Details: slow but steady gait, pt pushed IV pole however did not appear needed for UE support, denies dizziness  Stairs            Wheelchair Mobility    Modified Rankin (Stroke Patients Only)       Balance Overall balance assessment: No apparent balance deficits (not formally assessed)                                           Pertinent Vitals/Pain  Pain Assessment: No/denies pain    Home Living Family/patient expects to be discharged to:: Private residence Living Arrangements: Alone           Home Layout: One level Home Equipment: None      Prior Function Level of Independence: Independent               Hand Dominance        Extremity/Trunk Assessment   Upper Extremity Assessment: Overall WFL for tasks assessed           Lower Extremity Assessment: Overall WFL for tasks assessed         Communication   Communication: No difficulties  Cognition Arousal/Alertness: Awake/alert Behavior During Therapy: WFL for tasks assessed/performed Overall Cognitive Status: Within Functional Limits for tasks assessed                      General Comments      Exercises        Assessment/Plan    PT Assessment Patent does not need any further PT services  PT Diagnosis     PT Problem List    PT Treatment Interventions     PT Goals (Current goals can be found in the Care Plan section) Acute Rehab PT Goals PT Goal Formulation: All assessment and education complete, DC therapy  Frequency     Barriers to discharge        Co-evaluation               End of Session   Activity Tolerance: Patient tolerated treatment well Patient left: in bed;with call bell/phone within reach;with family/visitor present Nurse Communication: Mobility status         Time: 1006-1020 PT Time Calculation (min) (ACUTE ONLY): 14 min   Charges:   PT Evaluation $Initial PT Evaluation Tier I: 1 Procedure     PT G Codes:        Tremeka Helbling,KATHrine E 06/19/2015, 10:36 AM Carmelia Bake, PT, DPT 06/19/2015 Pager: [phone]

## 2015-06-20 ENCOUNTER — Telehealth: Payer: Self-pay | Admitting: *Deleted

## 2015-06-20 ENCOUNTER — Other Ambulatory Visit: Payer: Self-pay | Admitting: Hematology

## 2015-06-20 DIAGNOSIS — F329 Major depressive disorder, single episode, unspecified: Secondary | ICD-10-CM

## 2015-06-20 DIAGNOSIS — E440 Moderate protein-calorie malnutrition: Secondary | ICD-10-CM

## 2015-06-20 DIAGNOSIS — T451X5A Adverse effect of antineoplastic and immunosuppressive drugs, initial encounter: Secondary | ICD-10-CM

## 2015-06-20 DIAGNOSIS — E876 Hypokalemia: Secondary | ICD-10-CM

## 2015-06-20 DIAGNOSIS — N3000 Acute cystitis without hematuria: Secondary | ICD-10-CM

## 2015-06-20 DIAGNOSIS — E079 Disorder of thyroid, unspecified: Secondary | ICD-10-CM

## 2015-06-20 DIAGNOSIS — D61810 Antineoplastic chemotherapy induced pancytopenia: Secondary | ICD-10-CM | POA: Diagnosis present

## 2015-06-20 LAB — URINE CULTURE

## 2015-06-20 LAB — CBC
HEMATOCRIT: 27.7 % — AB (ref 36.0–46.0)
HEMOGLOBIN: 8.4 g/dL — AB (ref 12.0–15.0)
MCH: 26.8 pg (ref 26.0–34.0)
MCHC: 30.3 g/dL (ref 30.0–36.0)
MCV: 88.2 fL (ref 78.0–100.0)
Platelets: 95 10*3/uL — ABNORMAL LOW (ref 150–400)
RBC: 3.14 MIL/uL — AB (ref 3.87–5.11)
RDW: 16.2 % — ABNORMAL HIGH (ref 11.5–15.5)
WBC: 12.2 10*3/uL — ABNORMAL HIGH (ref 4.0–10.5)

## 2015-06-20 MED ORDER — SULFAMETHOXAZOLE-TRIMETHOPRIM 800-160 MG PO TABS: 1.0000 | ORAL_TABLET | Freq: Two times a day (BID) | ORAL | Status: AC

## 2015-06-20 MED ORDER — LEVOTHYROXINE SODIUM 100 MCG PO TABS: 100.0000 ug | ORAL_TABLET | Freq: Every day | ORAL | Status: AC

## 2015-06-20 MED ORDER — BOOST / RESOURCE BREEZE PO LIQD: 1.0000 | Freq: Three times a day (TID) | ORAL | Status: AC

## 2015-06-20 MED ORDER — PANTOPRAZOLE SODIUM 40 MG PO TBEC: 40.0000 mg | DELAYED_RELEASE_TABLET | Freq: Two times a day (BID) | ORAL | Status: DC

## 2015-06-20 MED ORDER — HEPARIN SOD (PORK) LOCK FLUSH 100 UNIT/ML IV SOLN
500.0000 [IU] | INTRAVENOUS | Status: AC | PRN
  Administered 2015-06-20: 500 [IU]

## 2015-06-20 MED ORDER — POLYETHYLENE GLYCOL 3350 17 G PO PACK: 17.0000 g | PACK | Freq: Every day | ORAL | Status: AC

## 2015-06-20 NOTE — Care Management Note (Signed)
Case Management Note  Patient Details  Name: CHIZARAM LATINO MRN: [id number] Date of Birth: February 26, 1961  Subjective/Objective:                    Action/Plan:d/c home no needs or orders.   Expected Discharge Date:                  Expected Discharge Plan:  Home/Self Care  In-House Referral:     Discharge planning Services  CM Consult  Post Acute Care Choice:    Choice offered to:     DME Arranged:    DME Agency:     HH Arranged:    [place] Agency:     Status of Service:  Completed, signed off  Medicare Important Message Given:  Yes-second notification given Date Medicare IM Given:    Medicare IM give by:    Date Additional Medicare IM Given:    Additional Medicare Important Message give by:     If discussed at [place] of Stay Meetings, dates discussed:    Additional Comments:  Dessa Phi, RN 06/20/2015, 12:43 PM

## 2015-06-20 NOTE — Telephone Encounter (Signed)
Per staff message and POF I have scheduled appts. Advised scheduler of appts. JMW  

## 2015-06-20 NOTE — Discharge Summary (Addendum)
Physician Discharge Summary  [name] TWS:[id number] DOB: [birth year] DOA: 06/16/2015  PCP: Myriam Jacobson, MD  Admit date: 06/16/2015 Discharge date: 06/20/2015  Time spent: 35 minutes  Recommendations for Outpatient Follow-up:  1. Discharge home on oral Bactrim for next 4 days to complete a seven-day course of antibiotic. Follow-up with oncology as outpatient.  2. monitor H&H as outpatient.  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 [facility])   Thyroid disease   Depression   Bipolar disorder (HCC)   Hypokalemia   Absolute anemia   Antineoplastic chemotherapy induced pancytopenia ([place])   Discharge Condition: Fair  Diet recommendation: Regular  Filed Weights   06/17/15 0223  Weight: 51.2 kg (112 lb 14 oz)    History of present illness:  54 year-old female with a past medical history of metastatic adenocarcinoma to liver from unknown primary site, status post chemotherapy with carboplatin and paclitaxel that was stopped after 2 cycles due to disease progression, undergoing second line chemotherapy with cisplatin and gemcitabine. She presented to the emergency department at [facility] with complaints of generalized weakness associated with nausea vomiting. Lab work revealing a hemoglobin of 6.2, previously 7.5 on 06/07/2015. She was admitted to the medicine service, typed and crossed and transfused a total of 2 units of packed red blood cells.  Patient was found to be febrile with temperature of 101F secondary to UTI.   Hospital Course:  Acute anemia Secondary to underlying malignancy. Received 2 units PRBC with improvement posttransfusion. Follow-up as outpatient.  Staph Aureus UTI Urine culture growing coag-negative staph. Mostly sensitive. Received IV Rocephin in the hospital. Will  discharge on Bactrim to complete a total seven-day course of antibiotic. Blood cultures negative.   metastatic adenocarcinoma to liver from unknown primary Following with Dr. Annamaria Boots. Restaging CT chest and abd done this admission shows  progressive multifocal hepatic metastasis and progressive lymphadenopathy in the porta hepatis and left supraclavicular area worrisome for metastatic disease. Multiple clinic infarcts noted. Also has some ascites. Plan on chemotherapy as outpatient. Has appt with Dr Burr Medico this week for second line of chemotherapy.     hypokalemia and hypomagnesemia Replenished.   hypothyroidism Elevated TSH. (8.55). Increase Synthroid dose to 100 g. Follow-up TSH as outpatient.    protein calorie malnutrition Add a supplement.   Essential hypertension Resume home medications.  CODE STATUS: Full code. Dr. Annamaria Boots discussed CODE STATUS with patient and her daughter. They would like to d/w other family members before deciding.  Disposition: home with outpt follow up  Procedures:  CT chest abd and pelvis  Consultations:  Dr. Annamaria Boots  Discharge Exam: Filed Vitals:   06/20/15 0840  BP: 109/78  Pulse: 92  Temp:   Resp:     General:  middle aged female in no acute distress HEENT: No pallor, moist oral mucosa, supple neck Chest: Clear to auscultation bilaterally CVS: Normal S1 and S2, no murmurs rub or gallop GI: Soft, mild distention with ascites, nontender, bowel sounds present Musculoskeletal: Warm, no edema CNS: Alert and oriented   Discharge Instructions    Current Discharge Medication List    START taking these medications   Details  feeding supplement (BOOST / RESOURCE BREEZE) LIQD Take 1 Container by mouth 3 (three) times daily between meals. Qty: 90 Container, Refills: 2    polyethylene glycol (MIRALAX / GLYCOLAX) packet  Take 17 g by mouth daily. Qty: 14 each, Refills: 0    sulfamethoxazole-trimethoprim (BACTRIM DS,SEPTRA DS) 800-160 MG tablet  Take 1 tablet by mouth 2 (two) times daily. Qty: 8 tablet, Refills: 0      CONTINUE these medications which have CHANGED   Details  levothyroxine (SYNTHROID, LEVOTHROID) 100 MCG tablet Take 1 tablet (100 mcg total) by mouth at bedtime. Qty: 30 tablet, Refills: 0      CONTINUE these medications which have NOT CHANGED   Details  amLODipine (NORVASC) 5 MG tablet Take 5 mg by mouth daily.    benztropine (COGENTIN) 1 MG tablet Take 1 tablet (1 mg total) by mouth at bedtime. Qty: 90 tablet, Refills: 2   Associated Diagnoses: Bipolar 1 disorder (HCC)    buPROPion (WELLBUTRIN XL) 300 MG 24 hr tablet Take 1 tablet (300 mg total) by mouth daily. Qty: 90 tablet, Refills: 2   Associated Diagnoses: Bipolar 1 disorder (HCC)    lidocaine-prilocaine (EMLA) cream Apply 1 application topically as needed. Apply to port 1 hr before treatment as instructed. Qty: 30 g, Refills: 0    morphine (MSIR) 15 MG tablet Take 1 tablet (15 mg total) by mouth every 12 (twelve) hours as needed for moderate pain or severe pain. Qty: 30 tablet, Refills: 0    ondansetron (ZOFRAN) 4 MG tablet Take 1 tablet (4 mg total) by mouth 4 (four) times daily as needed for nausea or vomiting. Qty: 60 tablet, Refills: 0    prochlorperazine (COMPAZINE) 10 MG tablet TAKE ONE TABLET BY MOUTH EVERY 6 HOURS AS NEEDED FOR NAUSEA OR VOMITING Qty: 30 tablet, Refills: 0    propranolol (INDERAL) 10 MG tablet Take 1 tablet (10 mg total) by mouth 3 (three) times daily. Qty: 270 tablet, Refills: 2    risperiDONE (RISPERDAL) 3 MG tablet Take 1 tablet (3 mg total) by mouth at bedtime. Qty: 90 tablet, Refills: 2    oxyCODONE (OXY IR/ROXICODONE) 5 MG immediate release tablet Take 1 tablet (5 mg total) by mouth every 4 (four) hours as needed for severe pain. Qty: 120 tablet, Refills: 0   Associated Diagnoses: Liver mass; Metastatic adenocarcinoma ([place]); Anemia in neoplastic disease    zolpidem (AMBIEN) 10 MG tablet Take 1 tablet (10 mg  total) by mouth at bedtime as needed for sleep. Qty: 90 tablet, Refills: 1       Allergies  Allergen Reactions  . Buspar [Buspirone] Other (See Comments)    More irritable and "goes off the handle"  . Cymbalta [Duloxetine Hcl] Other (See Comments)    Worked against her, husband can't remember exactly how  . Hydroxyzine Other (See Comments)    Doesn't work that well and causes constipation.  . Sertraline Other (See Comments)    Worked against her, but husband can not remember how so   Follow-up Information    Follow up with ROBERTS, Sharol Given, MD In 2 weeks.   Specialty:  Internal Medicine   Contact information:   [address] [place] [id number] [phone]       Follow up with [name], MD.   Specialties:  Hematology, Oncology   Why:  as scheduled   Contact information:   [place] [address] [phone]        The results of significant diagnostics from this hospitalization (including imaging, microbiology, ancillary and laboratory) are listed below for reference.    Significant Diagnostic Studies: Ct Chest W Contrast  06/18/2015   CLINICAL DATA:  Metastatic adenocarcinoma to the liver from unknown primary. History of cirrhosis. Subsequent encounter.  EXAM: CT CHEST, ABDOMEN, AND PELVIS WITH CONTRAST  TECHNIQUE: Multidetector CT imaging of the chest, abdomen and pelvis was performed following the standard protocol during bolus administration of intravenous contrast.  CONTRAST:  149mL OMNIPAQUE IOHEXOL 300 MG/ML  SOLN  COMPARISON:  CT 04/05/2015 and 02/19/2015.  FINDINGS: CT CHEST FINDINGS  Mediastinum/Nodes: Small left supraclavicular lymph nodes have increased in size and number, measuring up to 8 mm on image 4. No enlarged mediastinal, hilar or axillary lymph nodes identified. There is stable calcification in the left thyroid lobe. The trachea and esophagus demonstrate no significant findings. The heart size is normal. There is no pericardial  effusion. Right IJ Port-A-Cath tip is unchanged near the SVC right atrial junction.  Lungs/Pleura: There are new small dependent pleural effusions bilaterally which appear simple.There is new dependent atelectasis at both lung bases, but no confluent airspace opacity or suspicious pulmonary nodule. Biapical scarring appears unchanged.  Musculoskeletal/Chest wall: No chest wall mass or suspicious osseous findings. There is stable focal disc space loss at T10-11.  CT ABDOMEN AND PELVIS FINDINGS  Hepatobiliary: There is significantly progressive multifocal hepatic metastatic disease. The largest lesion posteriorly in the right hepatic lobe measures 5.2 x 4.8 cm on image 39 (previously 3.1 x 3.3 cm). There are multiple new lesions and all segments of the liver are involved. There is diffuse gallbladder wall edema. No calcified gallstone or biliary dilatation.  Pancreas: The previously noted small low-density lesion in the pancreatic tail is not as well seen, although grossly stable in size on image number 60. There is no evidence of pancreatic soft tissue mass or ductal dilatation.  Spleen: The spleen is mildly enlarged, measuring 14.0 x 6.4 x 14.8 cm. There are multiple new predominantly peripheral and wedge-shaped low-density lesions in the spleen, most consistent with infarcts. No definite splenic metastases suspected.  Adrenals/Urinary Tract: Both adrenal glands appear normal. The kidneys appear normal without evidence of urinary tract calculus, suspicious lesion or hydronephrosis. No bladder abnormalities are seen.  Stomach/Bowel: No bowel distension, focal wall thickening or mucosal lesion identified.  Vascular/Lymphatic: There are several enlarging lymph nodes within the porta hepatis, the largest measuring 4.9 x 3.3 cm on image 60 (previously 4.2 x 2.4 cm). No significant atherosclerosis. There are some splenic and left gonadal vein varices.  Reproductive: The uterus appears normal without residual endometrial  thickening. There is no adnexal mass.  Other: Patient has developed a moderate amount of ascites. No peritoneal nodularity observed to strongly suggest peritoneal carcinomatosis.  Musculoskeletal: No acute or significant osseous findings.  IMPRESSION: 1. Progressive multifocal hepatic metastatic disease. 2. There is also progressive lymphadenopathy in the porta hepatis and left supraclavicular area, worrisome for metastatic disease. 3. Interval development of ascites without definite peritoneal nodularity. 4. 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   Electronically Signed   By: Richardean Sale M.D.   On: 06/18/2015 20:56   Ct Abdomen Pelvis W Contrast  06/18/2015   CLINICAL DATA:  Metastatic adenocarcinoma to the liver from unknown primary. History of cirrhosis. Subsequent encounter.  EXAM: CT CHEST, ABDOMEN, AND PELVIS WITH CONTRAST  TECHNIQUE: Multidetector CT imaging of the chest, abdomen and pelvis was performed following the standard protocol during bolus administration of intravenous contrast.  CONTRAST:  133mL OMNIPAQUE IOHEXOL 300 MG/ML  SOLN  COMPARISON:  CT 04/05/2015 and 02/19/2015.  FINDINGS: CT CHEST FINDINGS  Mediastinum/Nodes: Small left supraclavicular lymph nodes have increased in size  and number, measuring up to 8 mm on image 4. No enlarged mediastinal, hilar or axillary lymph nodes identified. There is stable calcification in the left thyroid lobe. The trachea and esophagus demonstrate no significant findings. The heart size is normal. There is no pericardial effusion. Right IJ Port-A-Cath tip is unchanged near the SVC right atrial junction.  Lungs/Pleura: There are new small dependent pleural effusions bilaterally which appear simple.There is new dependent atelectasis at both lung bases, but no confluent airspace opacity or suspicious pulmonary nodule. Biapical scarring appears unchanged.  Musculoskeletal/Chest wall: No chest wall mass or suspicious osseous findings. There  is stable focal disc space loss at T10-11.  CT ABDOMEN AND PELVIS FINDINGS  Hepatobiliary: There is significantly progressive multifocal hepatic metastatic disease. The largest lesion posteriorly in the right hepatic lobe measures 5.2 x 4.8 cm on image 39 (previously 3.1 x 3.3 cm). There are multiple new lesions and all segments of the liver are involved. There is diffuse gallbladder wall edema. No calcified gallstone or biliary dilatation.  Pancreas: The previously noted small low-density lesion in the pancreatic tail is not as well seen, although grossly stable in size on image number 60. There is no evidence of pancreatic soft tissue mass or ductal dilatation.  Spleen: The spleen is mildly enlarged, measuring 14.0 x 6.4 x 14.8 cm. There are multiple new predominantly peripheral and wedge-shaped low-density lesions in the spleen, most consistent with infarcts. No definite splenic metastases suspected.  Adrenals/Urinary Tract: Both adrenal glands appear normal. The kidneys appear normal without evidence of urinary tract calculus, suspicious lesion or hydronephrosis. No bladder abnormalities are seen.  Stomach/Bowel: No bowel distension, focal wall thickening or mucosal lesion identified.  Vascular/Lymphatic: There are several enlarging lymph nodes within the porta hepatis, the largest measuring 4.9 x 3.3 cm on image 60 (previously 4.2 x 2.4 cm). No significant atherosclerosis. There are some splenic and left gonadal vein varices.  Reproductive: The uterus appears normal without residual endometrial thickening. There is no adnexal mass.  Other: Patient has developed a moderate amount of ascites. No peritoneal nodularity observed to strongly suggest peritoneal carcinomatosis.  Musculoskeletal: No acute or significant osseous findings.  IMPRESSION: 1. Progressive multifocal hepatic metastatic disease. 2. There is also progressive lymphadenopathy in the porta hepatis and left supraclavicular area, worrisome for  metastatic disease. 3. Interval development of ascites without definite peritoneal nodularity. 4. 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   Electronically Signed   By: Richardean Sale M.D.   On: 06/18/2015 20:56    Microbiology: Recent Results (from the past 240 hour(s))  Urine culture     Status: None   Collection Time: 06/17/15 11:30 AM  Result Value Ref Range Status   Specimen Description URINE, CLEAN CATCH  Final   Special Requests NONE  Final   Culture   Final    >=100,000 COLONIES/mL STAPHYLOCOCCUS SPECIES (COAGULASE NEGATIVE) Performed at [facility]    Report Status 06/20/2015 FINAL  Final   Organism ID, Bacteria STAPHYLOCOCCUS SPECIES (COAGULASE NEGATIVE)  Final      Susceptibility   Staphylococcus species (coagulase negative) - MIC*    CIPROFLOXACIN <=0.5 SENSITIVE Sensitive     GENTAMICIN <=0.5 SENSITIVE Sensitive     NITROFURANTOIN <=16 SENSITIVE Sensitive     OXACILLIN 2 SENSITIVE Sensitive     TETRACYCLINE <=1 SENSITIVE Sensitive     VANCOMYCIN <=0.5 SENSITIVE Sensitive     TRIMETH/SULFA <=10 SENSITIVE Sensitive     CLINDAMYCIN <=0.25 SENSITIVE Sensitive  RIFAMPIN <=0.5 SENSITIVE Sensitive     Inducible Clindamycin NEGATIVE Sensitive     * >=100,000 COLONIES/mL STAPHYLOCOCCUS SPECIES (COAGULASE NEGATIVE)  Culture, blood (routine x 2)     Status: None (Preliminary result)   Collection Time: 06/18/15  5:10 PM  Result Value Ref Range Status   Specimen Description BLOOD LEFT ARM  Final   Special Requests BOTTLES DRAWN AEROBIC AND ANAEROBIC 10CC  Final   Culture   Final    NO GROWTH 2 DAYS Performed at [facility]    Report Status PENDING  Incomplete  Culture, blood (routine x 2)     Status: None (Preliminary result)   Collection Time: 06/18/15  5:17 PM  Result Value Ref Range Status   Specimen Description BLOOD RIGHT ARM  Final   Special Requests BOTTLES DRAWN AEROBIC AND ANAEROBIC 10CC  Final   Culture   Final    NO GROWTH  2 DAYS Performed at [facility]    Report Status PENDING  Incomplete     Labs: Basic Metabolic Panel:  Recent Labs Lab 06/16/15 2334 06/16/15 2353 06/17/15 1115 06/18/15 0035 06/19/15 0447  NA 134* 136 135 136 135  K 3.2* 3.1* 4.4 4.5 4.3  CL 100* 97* 106 108 106  CO2 24  --  23 22 22   GLUCOSE 113* 108* 93 93 96  BUN 17 19 15 14 15   CREATININE 0.86 0.80 0.62 0.70 0.67  CALCIUM 8.2*  --  7.9* 7.8* 7.9*  MG  --   --  1.6* 1.8  --    Liver Function Tests:  Recent Labs Lab 06/16/15 2334  AST 30  ALT 17  ALKPHOS 113  BILITOT 1.0  PROT 5.5*  ALBUMIN 2.1*   No results for input(s): LIPASE, AMYLASE in the last 168 hours. No results for input(s): AMMONIA in the last 168 hours. CBC:  Recent Labs Lab 06/16/15 2334 06/16/15 2353 06/17/15 1115 06/18/15 0035 06/19/15 0447 06/20/15 0415  WBC 12.8*  --  12.3* 12.6* 12.5* 12.2*  NEUTROABS 10.2*  --   --   --   --   --   HGB 6.2* 8.2* 9.0* 9.0* 8.8* 8.4*  HCT 20.5* 24.0* 28.4* 28.2* 28.2* 27.7*  MCV 87.2  --  87.4 88.4 87.6 88.2  PLT 138*  --  131* 121* 104* 95*   Cardiac Enzymes: No results for input(s): CKTOTAL, CKMB, CKMBINDEX, TROPONINI in the last 168 hours. BNP: BNP (last 3 results) No results for input(s): BNP in the last 8760 hours.  ProBNP (last 3 results) No results for input(s): PROBNP in the last 8760 hours.  CBG: No results for input(s): GLUCAP in the last 168 hours.     SignedLouellen Molder  [facility] 06/20/2015, 12:03 PM

## 2015-06-20 NOTE — Progress Notes (Signed)
PHARMACIST - PHYSICIAN COMMUNICATION DR: Eliseo Squires CONCERNING: IV to Oral Route Change Policy  RECOMMENDATION: This patient is receiving Protonix by the intravenous route.  Based on criteria approved by the Pharmacy and Therapeutics Committee, the intravenous medication(s) is/are being converted to the equivalent oral dose form(s).   DESCRIPTION: These criteria include:  The patient is eating (either orally or via tube) and/or has been taking other orally administered medications for a least 24 hours  The patient has no evidence of active gastrointestinal bleeding or impaired GI absorption (gastrectomy, short bowel, patient on TNA or NPO).  If you have questions about this conversion, please contact the Pharmacy Department  []   [phone] )  [facility] []   [phone] )  [facility] []   [phone] )  [facility] []   [phone] )  [facility] [x]   [phone] )  [place], PharmD, pager [phone]. 06/20/2015,8:53 AM.

## 2015-06-21 ENCOUNTER — Telehealth: Payer: Self-pay | Admitting: *Deleted

## 2015-06-21 ENCOUNTER — Other Ambulatory Visit: Payer: Self-pay | Admitting: Hematology

## 2015-06-21 ENCOUNTER — Ambulatory Visit (HOSPITAL_COMMUNITY): Payer: Self-pay | Admitting: Psychiatry

## 2015-06-21 ENCOUNTER — Telehealth: Payer: Self-pay | Admitting: Hematology

## 2015-06-21 ENCOUNTER — Other Ambulatory Visit: Payer: Self-pay

## 2015-06-21 ENCOUNTER — Ambulatory Visit: Payer: Self-pay | Admitting: Hematology

## 2015-06-21 ENCOUNTER — Ambulatory Visit: Payer: Self-pay

## 2015-06-21 DIAGNOSIS — C801 Malignant (primary) neoplasm, unspecified: Principal | ICD-10-CM

## 2015-06-21 DIAGNOSIS — C787 Secondary malignant neoplasm of liver and intrahepatic bile duct: Secondary | ICD-10-CM

## 2015-06-21 NOTE — Telephone Encounter (Signed)
S.W. PT AND ADVISED ON oct APPTS....PT OK AND AWARE

## 2015-06-21 NOTE — Telephone Encounter (Signed)
Patient daughter [name]) called wanting to talk to MD Burr Medico about patient recent CT results. Patient daughter states that information was given to another sibling and she could not really explain the results to her that well. [name] would like to be reached back at [phone]. Message sent to MD Burr Medico.

## 2015-06-21 NOTE — Telephone Encounter (Signed)
I called [name] back, discussed CT result, discussed more chemo vs hospice. She will talk to her mother about hospice. Will arrange  paracentesis or possible Pleurx placement for her symptomatic malignant ascites. I'll see her next Tuesday  [name] 06/21/2015

## 2015-06-22 ENCOUNTER — Telehealth: Payer: Self-pay | Admitting: *Deleted

## 2015-06-22 ENCOUNTER — Other Ambulatory Visit: Payer: Self-pay | Admitting: Hematology

## 2015-06-23 LAB — CULTURE, BLOOD (ROUTINE X 2)
CULTURE: NO GROWTH
Culture: NO GROWTH

## 2015-06-25 ENCOUNTER — Telehealth: Payer: Self-pay | Admitting: *Deleted

## 2015-06-25 ENCOUNTER — Other Ambulatory Visit: Payer: Self-pay | Admitting: *Deleted

## 2015-06-25 ENCOUNTER — Other Ambulatory Visit: Payer: Self-pay | Admitting: Radiology

## 2015-06-25 ENCOUNTER — Telehealth: Payer: Self-pay | Admitting: Hematology

## 2015-06-25 NOTE — Telephone Encounter (Signed)
10/7 pof closed,order is wrong for paracentesis,per note on order central scheduling has left a message for the nurse.

## 2015-06-25 NOTE — Telephone Encounter (Signed)
Called to speak to daughter about Pleurx cath insertion tomorrow.  Daughter, [name] states that pt is not doing well & sleeps 23 out of 24 hours & when awake is delirious. Discussed Pleurx cath vs L/MD/RX appt for tomorrow.  [name] doesn't think she would tol the procedure well & thinks she would pull it out if it was placed. She doesn't think that the chemo would be helpful at this point either.  She states that she must have been in denial when Dr Burr Medico suggested Hospice on Sat but she has watched her for 2 days now & would appreciated someone coming to see her mother & helping with suggestions.  She feels that her mother is appropriate for Hospice at this time based on what she is seeing & reading. She wants to cancel appts for tomorrow at our office & radiology.  This will be done.  [facility] Referral made per Dr Burr Medico & expressed need for urgent visit per daughter.  OV notes, Hosp H&P & D/C note & demographic info faxed to Hospice @ [phone].  Pt is at daughter's home & this address & phone # was given for contact. POF to scheduler to cancel appts.

## 2015-06-26 ENCOUNTER — Other Ambulatory Visit: Payer: Self-pay

## 2015-06-26 ENCOUNTER — Ambulatory Visit: Payer: Self-pay | Admitting: Hematology

## 2015-06-26 ENCOUNTER — Ambulatory Visit (HOSPITAL_COMMUNITY): Payer: Self-pay

## 2015-06-26 ENCOUNTER — Telehealth: Payer: Self-pay | Admitting: *Deleted

## 2015-06-26 ENCOUNTER — Telehealth: Payer: Self-pay | Admitting: Hematology

## 2015-06-26 ENCOUNTER — Inpatient Hospital Stay (HOSPITAL_COMMUNITY): Admission: RE | Admit: 2015-06-26 | Payer: Self-pay | Source: Ambulatory Visit

## 2015-06-26 ENCOUNTER — Ambulatory Visit: Payer: Self-pay

## 2015-06-26 ENCOUNTER — Other Ambulatory Visit: Payer: Self-pay | Admitting: *Deleted

## 2015-06-26 MED ORDER — MORPHINE SULFATE (CONCENTRATE) 20 MG/ML PO SOLN: ORAL | Status: DC

## 2015-06-26 NOTE — Telephone Encounter (Signed)
Received vm call from [facility] [facility] stating pt was admitted today to Hospice services & daughter is concerned that pt will not be able to take po meds soon & nurse assured her that we could get liq med.  She asked for roxanol 20 mg/mo & 0.25 - 1 ml every 2 hours prn pain to be faxed to [facility].  Message to Dr Burr Medico.  Script faxed to [facility] notified.

## 2015-06-26 NOTE — Telephone Encounter (Signed)
Appointments cancelled per pof °

## 2015-06-27 ENCOUNTER — Other Ambulatory Visit: Payer: Self-pay | Admitting: *Deleted

## 2015-06-29 ENCOUNTER — Other Ambulatory Visit: Payer: Self-pay | Admitting: *Deleted

## 2015-06-29 MED ORDER — MORPHINE SULFATE (CONCENTRATE) 20 MG/ML PO SOLN: ORAL | Status: AC

## 2015-07-02 ENCOUNTER — Telehealth: Payer: Self-pay | Admitting: *Deleted

## 2015-07-02 ENCOUNTER — Other Ambulatory Visit: Payer: Self-pay | Admitting: *Deleted

## 2015-07-02 NOTE — Telephone Encounter (Signed)
Received faxed notice from after hours call nurse re:  Pt expired at 1720 pm  07-09-2015.   Dr. Burr Medico notified.

## 2015-07-05 ENCOUNTER — Inpatient Hospital Stay: Payer: Self-pay

## 2015-07-10 ENCOUNTER — Ambulatory Visit: Payer: Self-pay

## 2015-07-10 ENCOUNTER — Other Ambulatory Visit: Payer: Self-pay

## 2015-07-10 ENCOUNTER — Encounter: Payer: Self-pay | Admitting: Nutrition

## 2015-07-10 ENCOUNTER — Ambulatory Visit: Payer: Self-pay | Admitting: Hematology

## 2015-07-11 ENCOUNTER — Ambulatory Visit (HOSPITAL_COMMUNITY): Payer: Self-pay | Admitting: Psychiatry

## 2015-07-17 DEATH — deceased

## 2015-08-08 ENCOUNTER — Other Ambulatory Visit: Payer: Self-pay | Admitting: Hematology

## 2015-09-11 ENCOUNTER — Other Ambulatory Visit: Payer: Self-pay | Admitting: Nurse Practitioner

## 2015-11-05 NOTE — Telephone Encounter (Signed)
err

## 2016-04-14 IMAGING — US US PARACENTESIS
1 series · 13 of 13 positions shown · non-contrast
Comparison: none

CLINICAL DATA: 52-year-old with liver lesions of unknown etiology.
Tissue diagnosis is needed.

[Series 1: us paracentesis · 0.27mm/px · 13 of 13 slices shown]
[im 1/13]
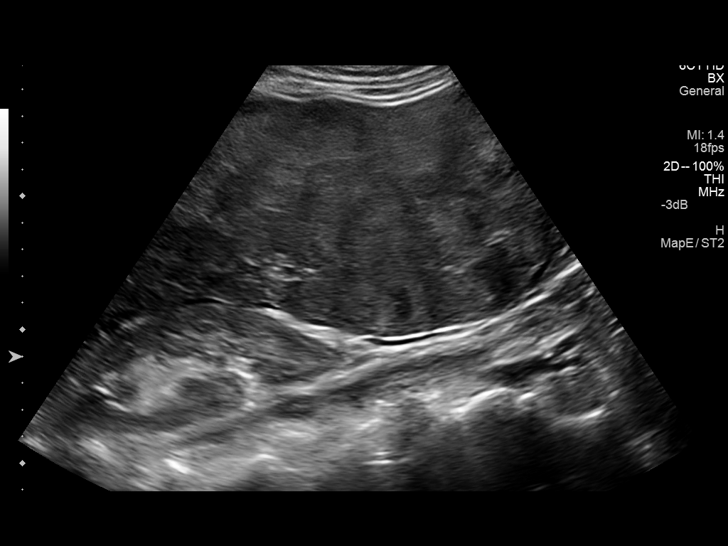
[im 2/13]
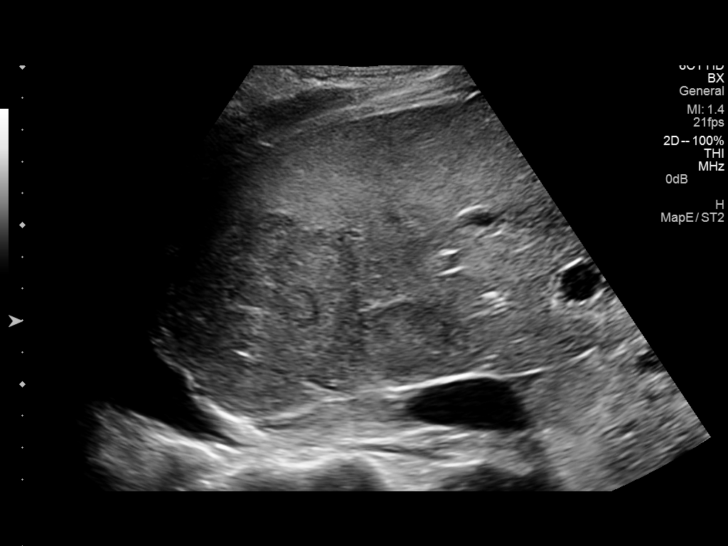
[im 3/13]
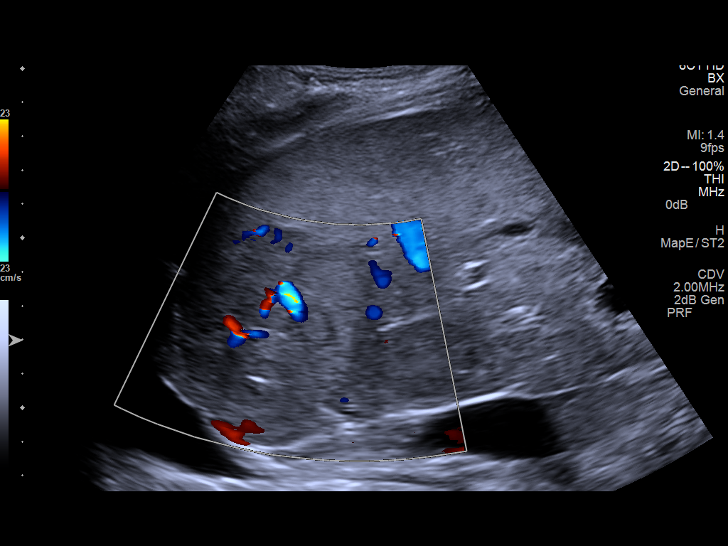
[im 4/13]
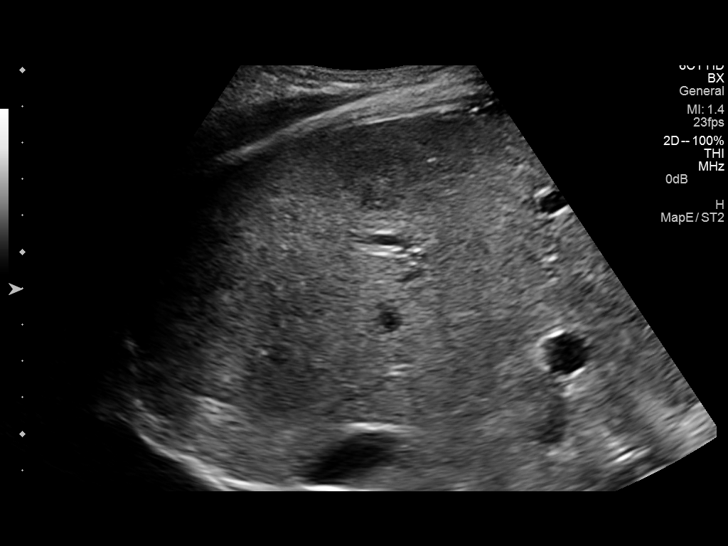
[im 5/13]
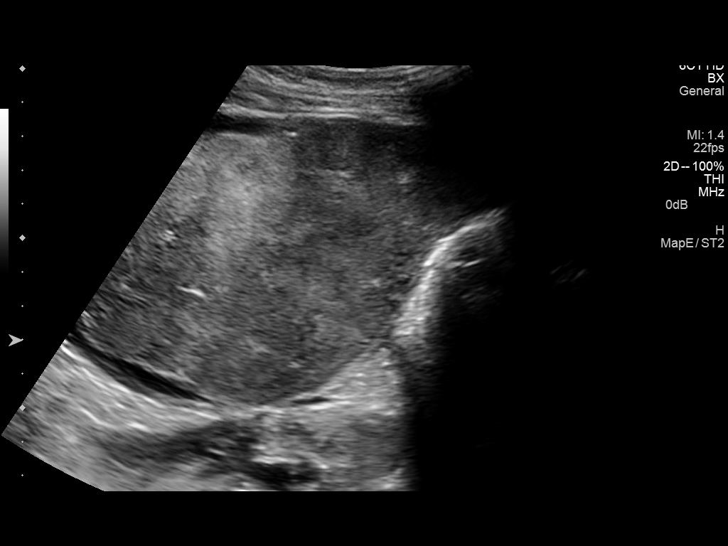
[im 6/13]
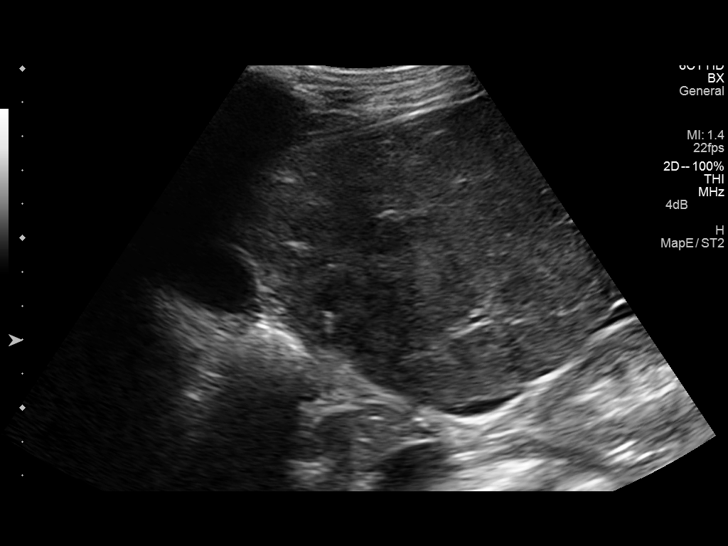
[im 7/13]
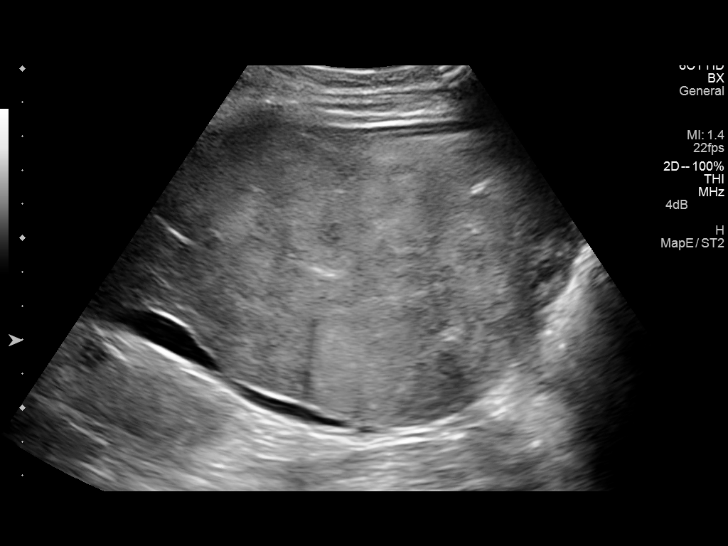
[im 8/13]
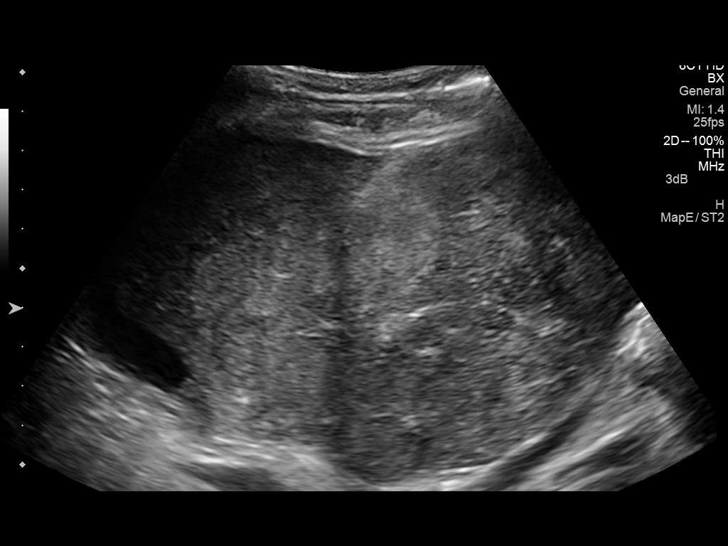
[im 9/13]
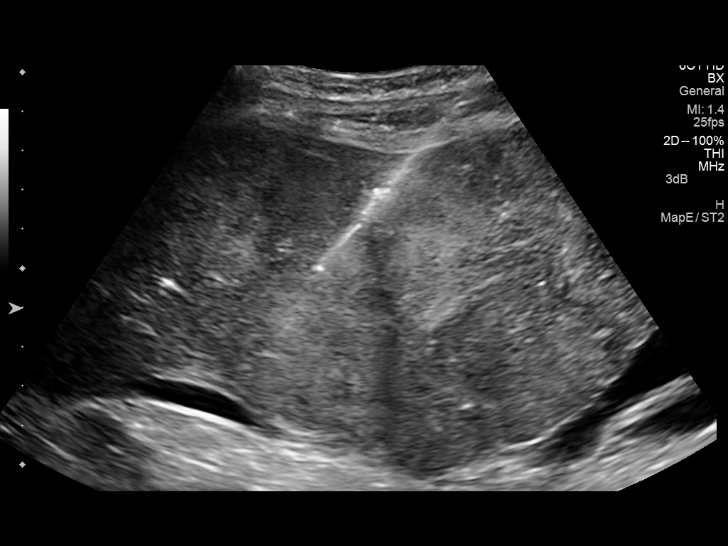
[im 10/13]
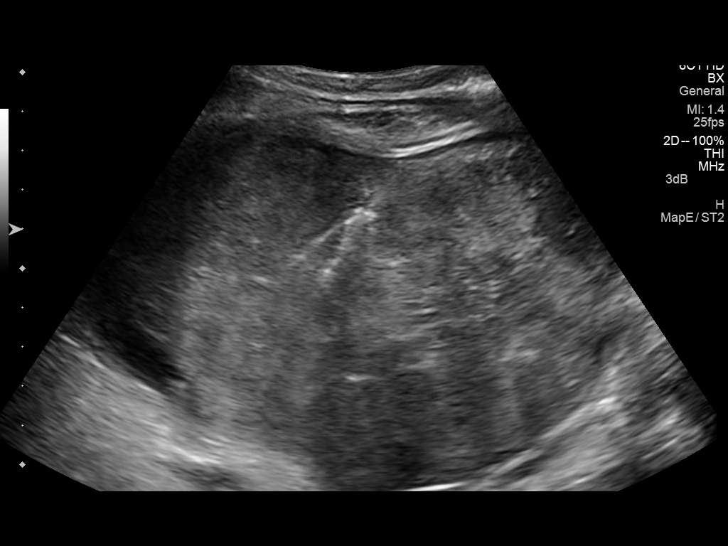
[im 11/13]
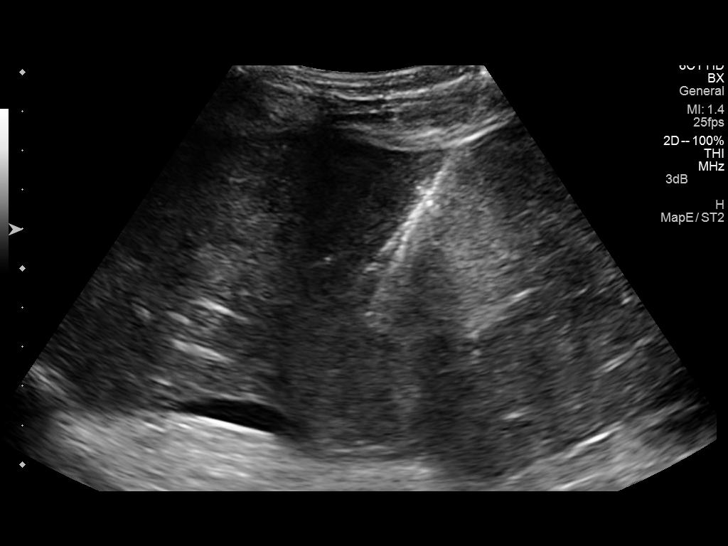
[im 12/13]
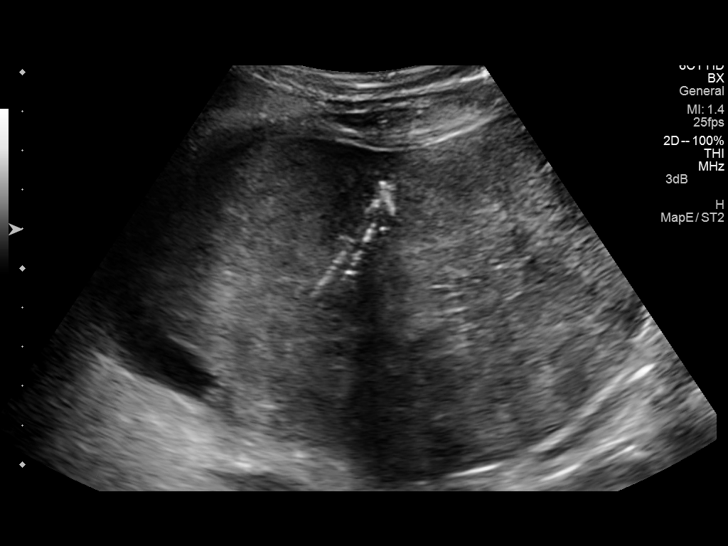
[im 13/13]
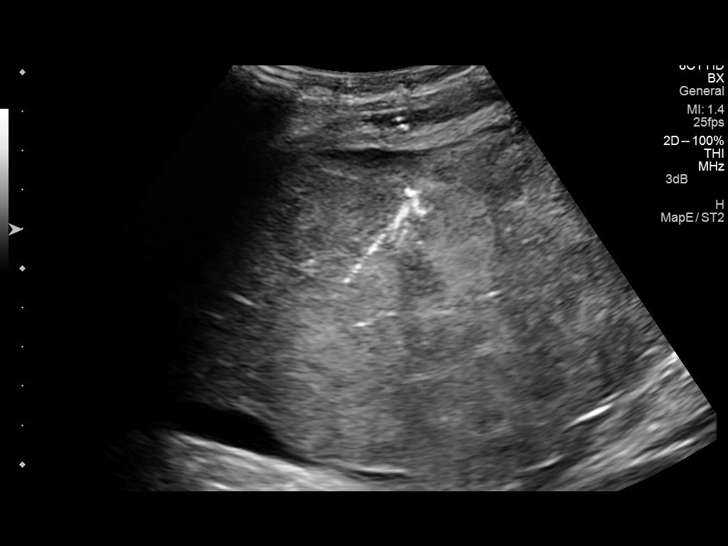

[13 of 13 positions shown; findings below may reference images not displayed]

EXAM:
ULTRASOUND-GUIDED LIVER LESION BIOPSY ; PARACENTESIS WITH ULTRASOUND
GUIDANCE

FLUOROSCOPY TIME:  None

MEDICATIONS:
1 mg versed, 50 mcg fentanyl. A radiology nurse monitored the
patient for moderate sedation.

ANESTHESIA/SEDATION:
Moderate sedation time: 20 min

PROCEDURE:
The procedure was explained to the patient. The risks and benefits
of the procedure were discussed and the patient's questions were
addressed. Informed consent was obtained from the patient. The
abdomen was evaluated with ultrasound. There is a small amount of
perihepatic ascites. Patient was consented for liver lesion biopsy
and paracentesis. Right side of the abdomen was prepped and draped
in a sterile fashion. 1% lidocaine was used for local anesthetic. A
Yueh catheter was directed into the perihepatic space with
ultrasound guidance. 600 ml of yellow fluid was aspirated. Large
heterogeneous lesions in the inferior lateral right hepatic lobe
were targeted. 17 gauge needle was directed into the liver with
ultrasound-guided and 3 core biopsies were obtained with an 18 gauge
device. Patient was placed with the left side down so that there was
no fluid adjacent to the puncture site. Patient tolerated the
procedure well.
FINDINGS: Evidence for right pleural fluid and small amount perihepatic
ascites. 600 ml of yellow fluid was removed. Fluid was sent for
cytology. Heterogeneous lesions in the right hepatic lobe. These
lesions were sampled. Core biopsies were placed in formalin.

COMPLICATIONS:
None
IMPRESSION: Successful ultrasound-guided core biopsies of the lesions in the
right hepatic lobe.

Ultrasound-guided paracentesis. 600 ml of yellow fluid was removed.
Sample sent for cytology.

## 2016-06-08 IMAGING — CT CT ABD-PELV W/ CM
2 of 5 series · 16 of 46 positions shown, 18 images · IV contrast (OMNIPAQUE)
Comparison: 08/07/2014 chest CT. 07/28/2014 abdominal MRI. No prior
pelvic CT.

CLINICAL DATA: Pain and swelling. Ongoing chemotherapy. Metastatic
adenocarcinoma liver with unknown primary.

EXAM:
CT CHEST, ABDOMEN, AND PELVIS WITH CONTRAST
TECHNIQUE: Multidetector CT imaging of the chest, abdomen and pelvis was
performed following the standard protocol during bolus
administration of intravenous contrast.
CONTRAST:  100mL OMNIPAQUE IOHEXOL 300 MG/ML  SOLN

[Series 2: cap with st · axial · 0.73mm/px · z∈[-562,-8]mm · 13 of 125 slices shown, 15 images]
[im 7/125  soft-tissue]
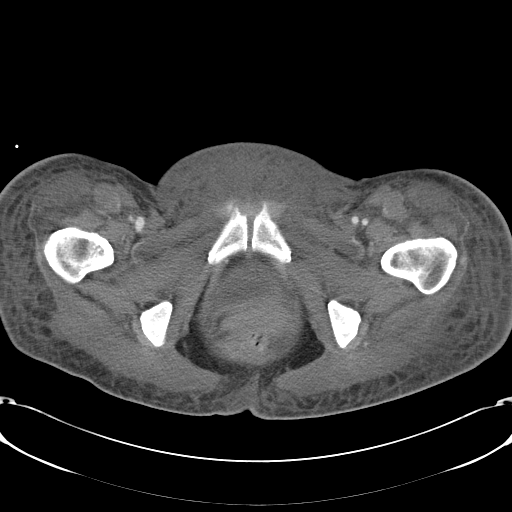
[im 7/125  bone]
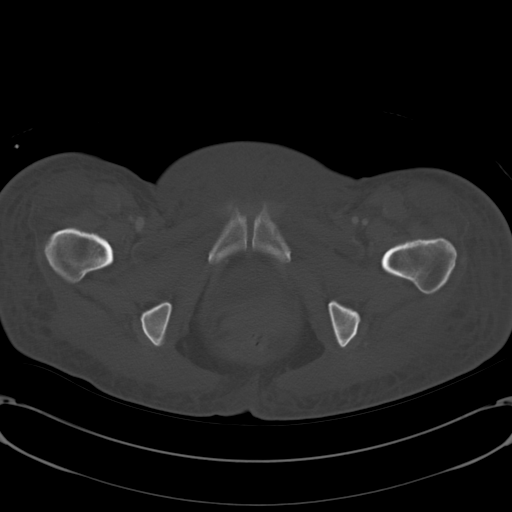
[im 20/125  soft-tissue]
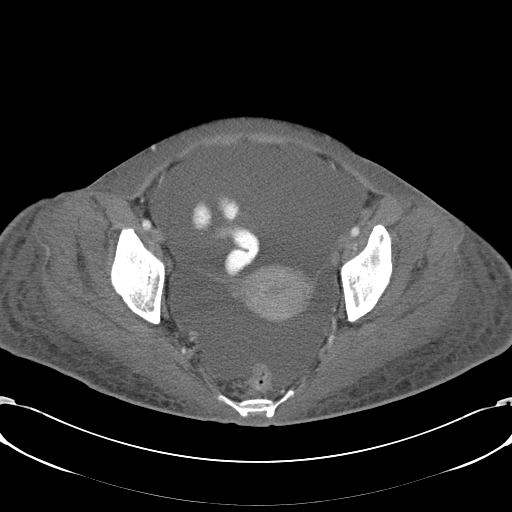
[im 27/125  soft-tissue]
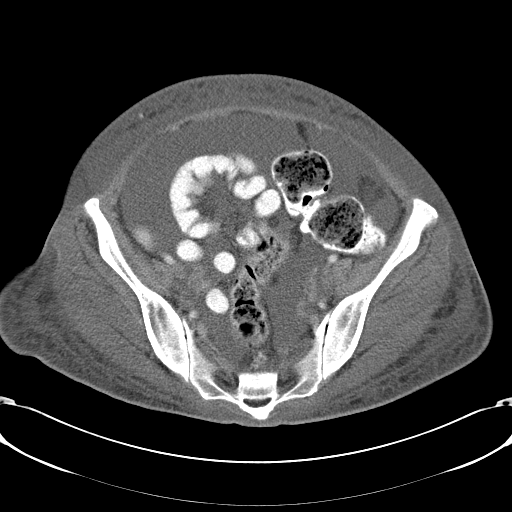
[im 33/125  soft-tissue]
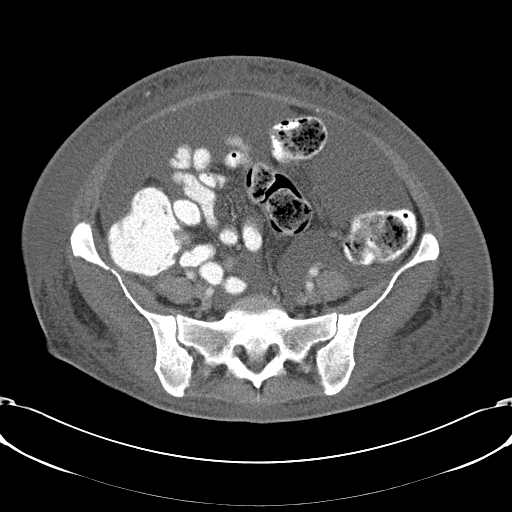
[im 46/125  soft-tissue]
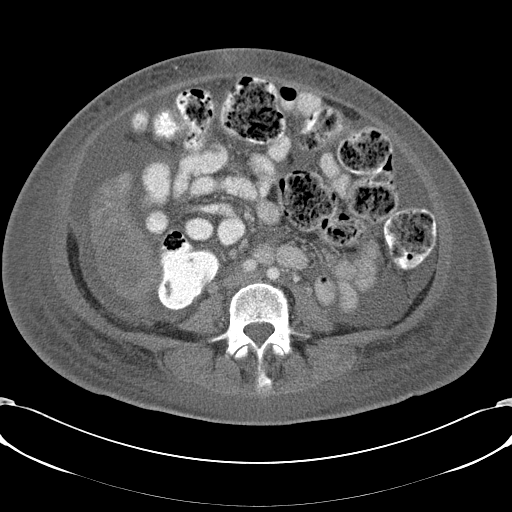
[im 53/125  soft-tissue]
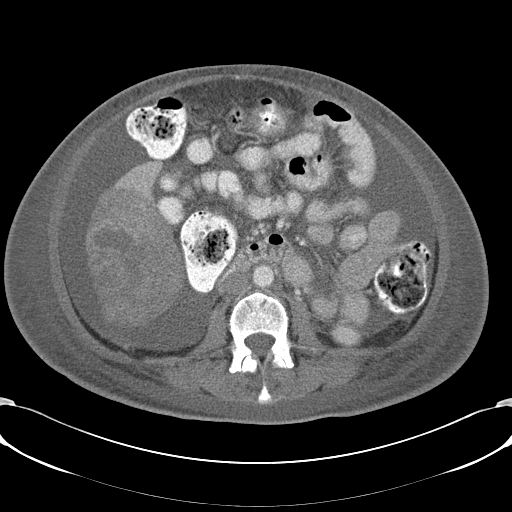
[im 66/125  soft-tissue]
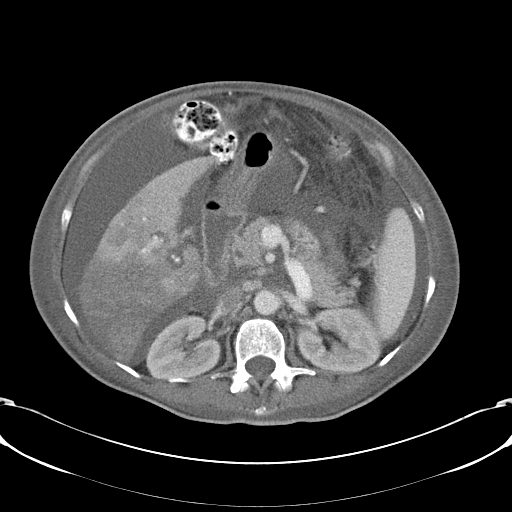
[im 72/125  soft-tissue]
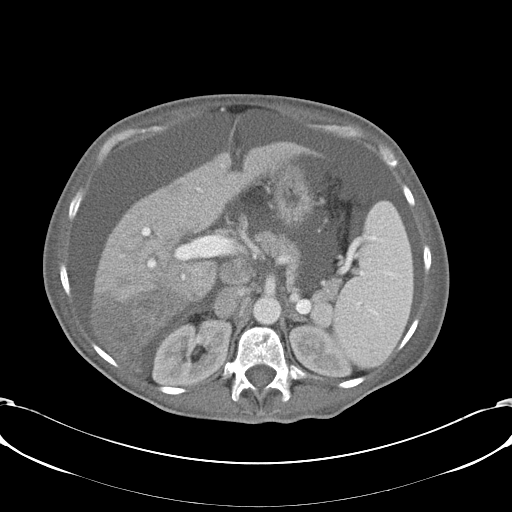
[im 79/125  soft-tissue]
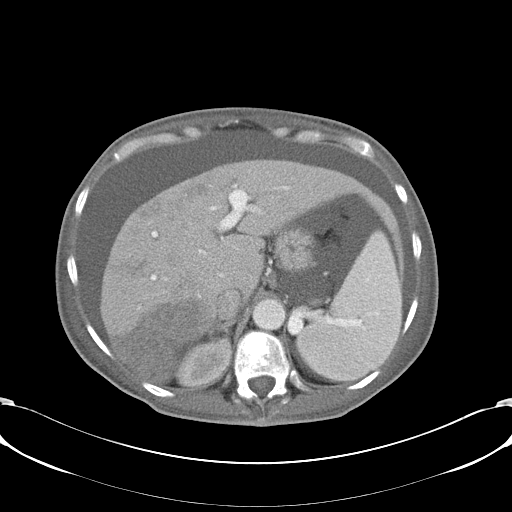
[im 79/125  bone]
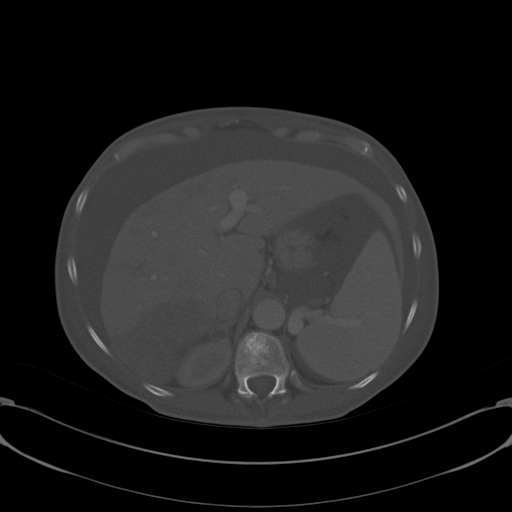
[im 92/125  soft-tissue]
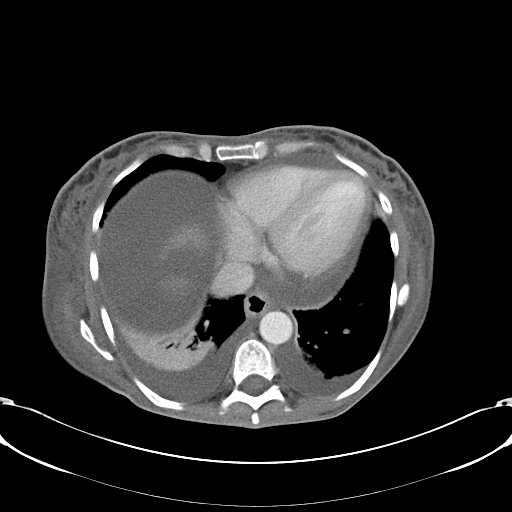
[im 98/125  soft-tissue]
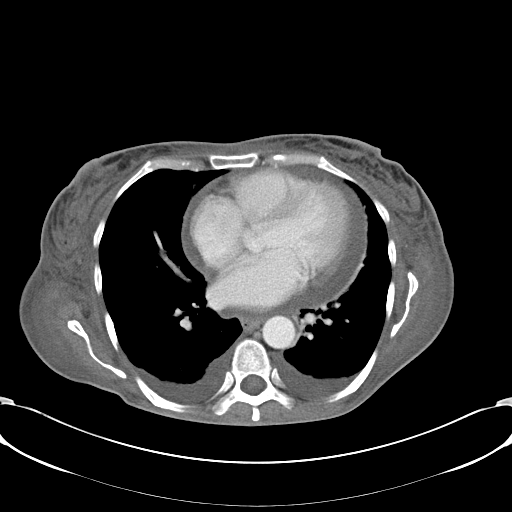
[im 105/125  soft-tissue]
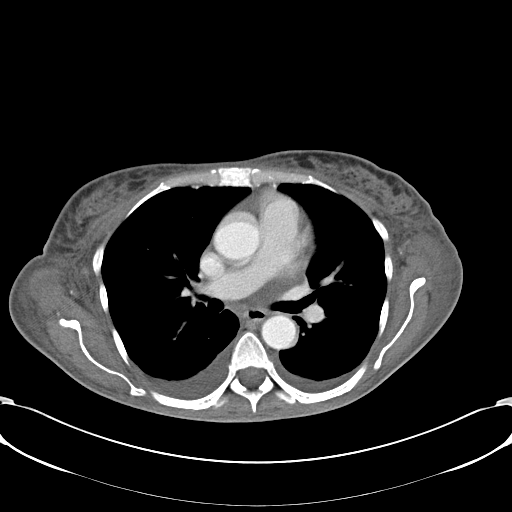
[im 118/125  soft-tissue]
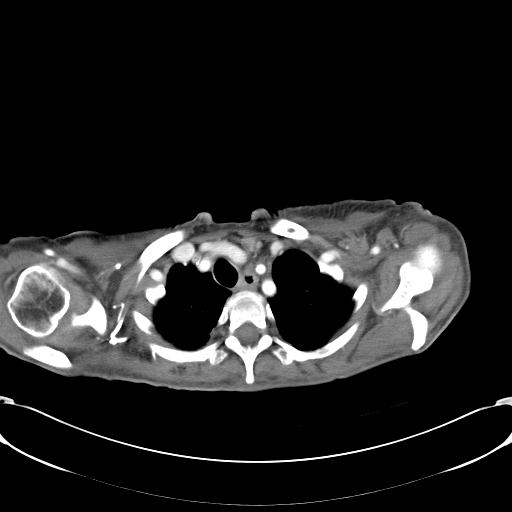

[Series 602: <mpr thick range> · coronal · 1.22mm/px · 3 of 93 slices shown]
[im 31/93  soft-tissue]
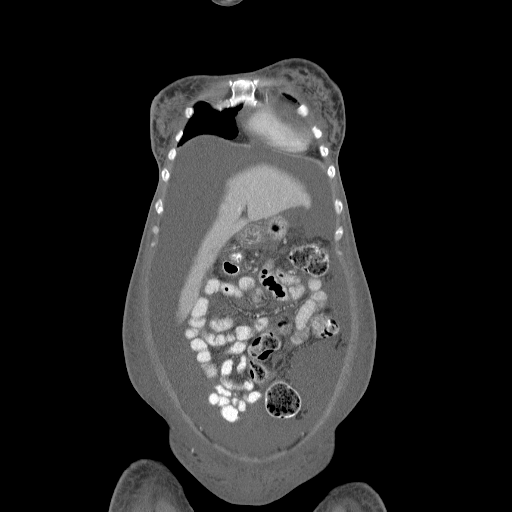
[im 41/93  soft-tissue]
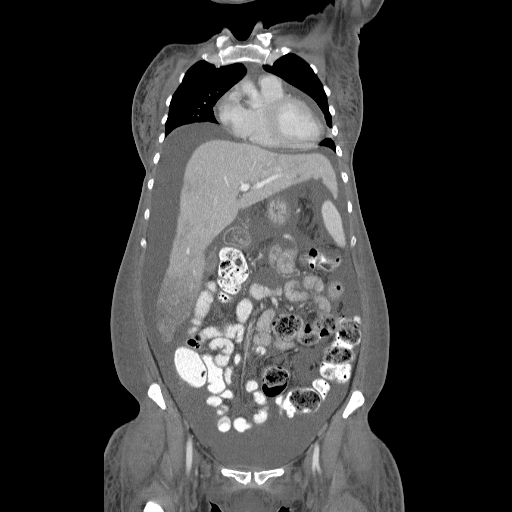
[im 52/93  soft-tissue]
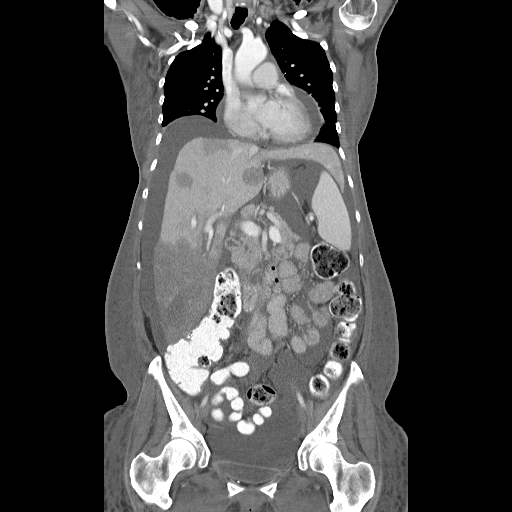

[16 of 46 positions shown; findings below may reference images not displayed]

FINDINGS: CT CHEST FINDINGS

Mediastinum/Nodes: Left supraclavicular adenopathy is again
suspected. 1.0 cm on image 4. Similar to on the prior exam (when
remeasured).

Borderline cardiomegaly. Increased small to moderate pericardial
effusion. No central pulmonary embolism, on this non-dedicated
study. No mediastinal or hilar adenopathy.

Lungs/Pleura: Persistent right base atelectasis.  Clear left lung.

Small right greater than left pleural effusions are similar to
08/07/2014.

Musculoskeletal: Advanced degenerative disc disease at T10-11.

CT ABDOMEN PELVIS FINDINGS

Hepatobiliary: Hepatic metastasis. Anterior segment right liver lobe
2.5 cm lesion on image 45 is enlarged from 2.0 cm on 07/28/2014.

Caudate lobe lesion measures 2.6 cm on image 44 versus 2.2 cm on the
prior MRI.

Multiple small posterior segment right hepatic lobe lesions have
coalesced into a dominant lesion. This measures 7.0 x 5.9 cm,
including on image 43. Hepatomegaly is marked at 23.8 cm
craniocaudal

Normal gallbladder, without biliary ductal dilatation.

Pancreas: Normal, without mass or ductal dilatation.

Spleen: Normal

Adrenals/Urinary Tract: Normal adrenal glands. Normal right kidney.
Too small to characterize interpolar left renal lesion. No
hydronephrosis. Normal urinary bladder.

Stomach/Bowel: Small hiatal hernia. The stomach is decompressed.
Colonic stool burden suggests constipation. Normal terminal ileum.
Normal small bowel loops.

Vascular/Lymphatic: Normal caliber of the aorta and branch vessels.
Necrotic portal caval node measures 1.8 by 2.6 cm on image 55.
Compare 1.8 x 2.4 cm on the prior MRI. No pelvic sidewall
adenopathy.

Reproductive: Normal uterus and adnexa.

Other: Large volume abdominal pelvic ascites. Slightly increased
since 08/07/2014. Diffuse anasarca.

Musculoskeletal: No acute osseous abnormality.
IMPRESSION: CT CHEST IMPRESSION

1. Mild left supraclavicular adenopathy, for which nodal metastasis
cannot be excluded. This is grossly similar.
2. Otherwise, no evidence of metastatic disease within the chest.
3. Right greater than left pleural effusions are similar. Similar
right base atelectasis.
4. Borderline cardiomegaly with increase in mild to moderate
pericardial effusion.

CT ABDOMEN AND PELVIS IMPRESSION

1. Progressive hepatic metastasis with persistent marked
hepatomegaly.
2. Similar nodal metastasis in the portal caval space.
3. Increase in large volume abdominal pelvic ascites.
4. Diffuse anasarca.
